# Patient Record
Sex: Female | Born: 1935 | Race: White | Hispanic: No | Marital: Single | Smoking: Never smoker
Health system: Southern US, Community
[De-identification: ages and names within clinical notes are randomized; demographics above are authoritative.]

## PROBLEM LIST (undated history)

## (undated) DIAGNOSIS — T8859XA Other complications of anesthesia, initial encounter: Secondary | ICD-10-CM

## (undated) DIAGNOSIS — I10 Essential (primary) hypertension: Secondary | ICD-10-CM

## (undated) DIAGNOSIS — H269 Unspecified cataract: Secondary | ICD-10-CM

## (undated) DIAGNOSIS — T4145XA Adverse effect of unspecified anesthetic, initial encounter: Secondary | ICD-10-CM

## (undated) DIAGNOSIS — F0390 Unspecified dementia without behavioral disturbance: Secondary | ICD-10-CM

## (undated) DIAGNOSIS — G459 Transient cerebral ischemic attack, unspecified: Secondary | ICD-10-CM

## (undated) DIAGNOSIS — R112 Nausea with vomiting, unspecified: Secondary | ICD-10-CM

## (undated) DIAGNOSIS — E039 Hypothyroidism, unspecified: Secondary | ICD-10-CM

## (undated) DIAGNOSIS — I38 Endocarditis, valve unspecified: Secondary | ICD-10-CM

## (undated) DIAGNOSIS — Z9889 Other specified postprocedural states: Secondary | ICD-10-CM

## (undated) DIAGNOSIS — M1990 Unspecified osteoarthritis, unspecified site: Secondary | ICD-10-CM

## (undated) DIAGNOSIS — K219 Gastro-esophageal reflux disease without esophagitis: Secondary | ICD-10-CM

## (undated) HISTORY — PX: COLONOSCOPY: SHX174

## (undated) HISTORY — PX: DILATION AND CURETTAGE OF UTERUS: SHX78

## (undated) HISTORY — PX: CATARACT EXTRACTION: SUR2

## (undated) HISTORY — PX: BREAST SURGERY: SHX581

---

## 2009-05-27 ENCOUNTER — Ambulatory Visit: Payer: Self-pay | Admitting: Diagnostic Radiology

## 2009-05-27 ENCOUNTER — Emergency Department (HOSPITAL_BASED_OUTPATIENT_CLINIC_OR_DEPARTMENT_OTHER): Admission: EM | Admit: 2009-05-27 | Discharge: 2009-05-28 | Payer: Self-pay | Admitting: Emergency Medicine

## 2010-07-11 LAB — URINE MICROSCOPIC-ADD ON

## 2010-07-11 LAB — URINALYSIS, ROUTINE W REFLEX MICROSCOPIC
Glucose, UA: NEGATIVE mg/dL
Hgb urine dipstick: NEGATIVE
Ketones, ur: NEGATIVE mg/dL
Protein, ur: NEGATIVE mg/dL
Specific Gravity, Urine: 1.023 (ref 1.005–1.030)

## 2010-07-11 LAB — CBC
HCT: 37.9 % (ref 36.0–46.0)
MCV: 82.3 fL (ref 78.0–100.0)
RBC: 4.61 MIL/uL (ref 3.87–5.11)
WBC: 8.1 10*3/uL (ref 4.0–10.5)

## 2010-07-11 LAB — COMPREHENSIVE METABOLIC PANEL
AST: 23 U/L (ref 0–37)
Albumin: 4.3 g/dL (ref 3.5–5.2)
BUN: 20 mg/dL (ref 6–23)
Calcium: 9.6 mg/dL (ref 8.4–10.5)
Creatinine, Ser: 0.8 mg/dL (ref 0.4–1.2)
GFR calc non Af Amer: >60 mL/min (ref >60)
Total Protein: 7.5 g/dL (ref 6.0–8.3)

## 2010-07-11 LAB — DIFFERENTIAL
Basophils Absolute: 0.2 10*3/uL — ABNORMAL HIGH (ref 0.0–0.1)
Basophils Relative: 2 % — ABNORMAL HIGH (ref 0–1)
Eosinophils Absolute: 0.1 10*3/uL (ref 0.0–0.7)
Eosinophils Relative: 1 % (ref 0–5)
Monocytes Absolute: 0.8 10*3/uL (ref 0.1–1.0)

## 2012-07-19 ENCOUNTER — Emergency Department (HOSPITAL_BASED_OUTPATIENT_CLINIC_OR_DEPARTMENT_OTHER)
Admission: EM | Admit: 2012-07-19 | Discharge: 2012-07-19 | Disposition: A | Discharge status: Home / Self Care | Payer: Medicare Other | Attending: Emergency Medicine | Admitting: Emergency Medicine

## 2012-07-19 ENCOUNTER — Encounter (HOSPITAL_BASED_OUTPATIENT_CLINIC_OR_DEPARTMENT_OTHER): Payer: Self-pay | Admitting: Emergency Medicine

## 2012-07-19 DIAGNOSIS — Z8669 Personal history of other diseases of the nervous system and sense organs: Secondary | ICD-10-CM | POA: Insufficient documentation

## 2012-07-19 DIAGNOSIS — R5381 Other malaise: Secondary | ICD-10-CM | POA: Insufficient documentation

## 2012-07-19 DIAGNOSIS — H5702 Anisocoria: Secondary | ICD-10-CM | POA: Insufficient documentation

## 2012-07-19 DIAGNOSIS — R531 Weakness: Secondary | ICD-10-CM

## 2012-07-19 HISTORY — DX: Unspecified cataract: H26.9

## 2012-07-19 HISTORY — DX: Essential (primary) hypertension: I10

## 2012-07-19 LAB — CBC WITH DIFFERENTIAL/PLATELET
Basophils Relative: 0 % (ref 0–1)
Eosinophils Absolute: 0.1 10*3/uL (ref 0.0–0.7)
HCT: 38.7 % (ref 36.0–46.0)
Lymphocytes Relative: 22 % (ref 12–46)
Lymphs Abs: 1.5 10*3/uL (ref 0.7–4.0)
MCV: 80.8 fL (ref 78.0–100.0)
RBC: 4.79 MIL/uL (ref 3.87–5.11)
RDW: 13.5 % (ref 11.5–15.5)

## 2012-07-19 LAB — URINALYSIS, ROUTINE W REFLEX MICROSCOPIC
Bilirubin Urine: NEGATIVE
Hgb urine dipstick: NEGATIVE
Nitrite: NEGATIVE

## 2012-07-19 LAB — BASIC METABOLIC PANEL
Creatinine, Ser: 0.8 mg/dL (ref 0.50–1.10)
GFR calc Af Amer: 75 mL/min — ABNORMAL LOW (ref >90)
Sodium: 133 mEq/L — ABNORMAL LOW (ref 135–145)

## 2012-07-19 MED ORDER — SODIUM CHLORIDE 0.9 % IV BOLUS (SEPSIS)
1000.0000 mL | Freq: Once | INTRAVENOUS | Status: AC
  Administered 2012-07-19: 1000 mL via INTRAVENOUS

## 2012-07-19 NOTE — ED Notes (Signed)
Pt reports feelings of weakness have been going on for about a month. Pt reported to ED because she realized she "has got to do something" about the weakness.

## 2012-07-19 NOTE — ED Notes (Signed)
PT ASSISTED WITH CALLING HER SON TO PICK HER UP. SON STATES HE IS ON HIS WAY HERE.

## 2012-07-19 NOTE — ED Notes (Signed)
Pt unable to remember any past medical history. States that the only medications she takes are "vitamins" but does not know which vitamins. Son states that she takes Lisinopril for HTN. Son said do not give pt anything to help her sleep because it effects her too strongly. Son reports that pt was on a "statin" but that she stopped due to weakness.

## 2012-07-19 NOTE — ED Provider Notes (Signed)
History     CSN: [id number]  Arrival date & time 07/19/12  4098   First MD Initiated Contact with Patient 07/19/12 [phone]      Chief Complaint  Patient presents with  . Weakness    (Consider location/radiation/quality/duration/timing/severity/associated sxs/prior treatment) Patient is a 79 y.o. female presenting with weakness.  Weakness   Pt with no significant PMH reports generalized weakness for the last 2 months, gradually worsening. Unable to continue with previous exercise regimen, but denies any other specific symptoms. She has not had any CP, SOB, N/V/D, bloody or melanic stools, no dysuria or frequency. No weight loss. She has had normal appetite.   Past Medical History  Diagnosis Date  . Cataract     Past Surgical History  Procedure Laterality Date  . Cataract extraction      No family history on file.  History  Substance Use Topics  . Smoking status: Never Smoker   . Smokeless tobacco: Not on file  . Alcohol Use: No    OB History   Grav Para Term Preterm Abortions TAB SAB Ect Mult Living                  Review of Systems  Neurological: Positive for weakness.   All other systems reviewed and are negative except as noted in HPI.   Allergies  Review of patient's allergies indicates not on file.  Home Medications  No current outpatient prescriptions on file.  BP 129/62  Pulse 65  Temp(Src) 97.5 F (36.4 C) (Oral)  Resp 14  Ht 5\' 4"  (1.626 m)  Wt 118 lb (53.524 kg)  BMI 20.24 kg/m2  SpO2 97%  Physical Exam  Nursing note and vitals reviewed. Constitutional: She is oriented to person, place, and time. She appears well-developed and well-nourished.  HENT:  Head: Normocephalic and atraumatic.  Eyes: EOM are normal.  Anisocoria from prior cataract surgeries  Neck: Normal range of motion. Neck supple.  Cardiovascular: Normal rate, normal heart sounds and intact distal pulses.   Pulmonary/Chest: Effort normal and breath sounds normal.   Abdominal: Bowel sounds are normal. She exhibits no distension. There is no tenderness.  Musculoskeletal: Normal range of motion. She exhibits no edema and no tenderness.  Neurological: She is alert and oriented to person, place, and time. She has normal strength. No cranial nerve deficit or sensory deficit.  Skin: Skin is warm and dry. No rash noted.  Psychiatric: She has a normal mood and affect.    ED Course  Procedures (including critical care time)  Labs Reviewed  BASIC METABOLIC PANEL - Abnormal; Notable for the following:    Sodium 133 (*)    GFR calc non Af Amer 64 (*)    GFR calc Af Amer 75 (*)    All other components within normal limits  URINE CULTURE  URINALYSIS, ROUTINE W REFLEX MICROSCOPIC  CBC WITH DIFFERENTIAL   No results found.   1. Generalized weakness       MDM   Date: 07/19/2012  Rate: 59  Rhythm: normal sinus rhythm  QRS Axis: normal  Intervals: normal  ST/T Wave abnormalities: normal  Conduction Disutrbances:none  Narrative Interpretation: LVH criteria  Old EKG Reviewed: none available  12:48 PM Pt with generalized weakness, no focal neuro deficits and no other specific complaints. Basic labs here are unremarkable. No anemia or electrolyte abnormalities. EKG unremarkable. Advised to followup with PCP for further evaluation and more in depth evaluation than we can do here including consideration of thyroid  function.         Charles B. Bernette Mayers, MD 07/19/12 1249

## 2012-07-20 LAB — URINE CULTURE: Colony Count: NO GROWTH

## 2012-10-15 ENCOUNTER — Encounter (HOSPITAL_BASED_OUTPATIENT_CLINIC_OR_DEPARTMENT_OTHER): Payer: Self-pay | Admitting: Emergency Medicine

## 2012-10-15 ENCOUNTER — Emergency Department (HOSPITAL_BASED_OUTPATIENT_CLINIC_OR_DEPARTMENT_OTHER): Payer: Medicare Other

## 2012-10-15 ENCOUNTER — Emergency Department (HOSPITAL_BASED_OUTPATIENT_CLINIC_OR_DEPARTMENT_OTHER)
Admission: EM | Admit: 2012-10-15 | Discharge: 2012-10-15 | Disposition: A | Discharge status: Home / Self Care | Payer: Medicare Other | Attending: Emergency Medicine | Admitting: Emergency Medicine

## 2012-10-15 DIAGNOSIS — Z8669 Personal history of other diseases of the nervous system and sense organs: Secondary | ICD-10-CM | POA: Insufficient documentation

## 2012-10-15 DIAGNOSIS — Z9849 Cataract extraction status, unspecified eye: Secondary | ICD-10-CM | POA: Insufficient documentation

## 2012-10-15 DIAGNOSIS — Z79899 Other long term (current) drug therapy: Secondary | ICD-10-CM | POA: Insufficient documentation

## 2012-10-15 DIAGNOSIS — M533 Sacrococcygeal disorders, not elsewhere classified: Secondary | ICD-10-CM | POA: Insufficient documentation

## 2012-10-15 DIAGNOSIS — I10 Essential (primary) hypertension: Secondary | ICD-10-CM | POA: Insufficient documentation

## 2012-10-15 MED ORDER — HYDROCODONE-ACETAMINOPHEN 5-325 MG PO TABS: 0.5000 | ORAL_TABLET | ORAL | Status: DC | PRN

## 2012-10-15 MED ORDER — ONDANSETRON HCL 4 MG/2ML IJ SOLN
4.0000 mg | Freq: Once | INTRAMUSCULAR | Status: AC
  Administered 2012-10-15: 4 mg via INTRAVENOUS

## 2012-10-15 MED ORDER — NAPROXEN 250 MG PO TABS
250.0000 mg | ORAL_TABLET | Freq: Once | ORAL | Status: AC
  Administered 2012-10-15: 250 mg via ORAL
  Filled 2012-10-15: qty 1

## 2012-10-15 MED ORDER — FENTANYL CITRATE 0.05 MG/ML IJ SOLN
50.0000 ug | Freq: Once | INTRAMUSCULAR | Status: AC
  Administered 2012-10-15: 04:00:00 via INTRAVENOUS

## 2012-10-15 MED ORDER — ONDANSETRON HCL 4 MG/2ML IJ SOLN
INTRAMUSCULAR | Status: AC
  Administered 2012-10-15: 4 mg via INTRAVENOUS
  Filled 2012-10-15: qty 2

## 2012-10-15 MED ORDER — CELECOXIB 200 MG PO CAPS: ORAL_CAPSULE | ORAL | Status: DC

## 2012-10-15 MED ORDER — FENTANYL CITRATE 0.05 MG/ML IJ SOLN
25.0000 ug | Freq: Once | INTRAMUSCULAR | Status: AC
  Administered 2012-10-15: 25 ug via INTRAVENOUS
  Filled 2012-10-15: qty 2

## 2012-10-15 MED ORDER — SODIUM CHLORIDE 0.9 % IV SOLN
Freq: Once | INTRAVENOUS | Status: AC
  Administered 2012-10-15: 04:00:00 via INTRAVENOUS

## 2012-10-15 NOTE — ED Notes (Signed)
Patient transported to X-ray 

## 2012-10-15 NOTE — ED Provider Notes (Signed)
History    CSN: [id number] Arrival date & time 10/15/12  4098  First MD Initiated Contact with Patient 10/15/12 [phone]     Chief Complaint  Patient presents with  . Back Pain   (Consider location/radiation/quality/duration/timing/severity/associated sxs/prior Treatment) HPI An 79 year old female with a two-week history of pain in her left sacroiliac joint. It significantly worsened yesterday evening and she describes it as a 9/10. It is a sharp pain that does not radiate. It is worse with movement and makes ambulation and movement difficult. She denies injury although she has fallen as a result of the pain.   Past Medical History  Diagnosis Date  . Cataract   . Hypertension    Past Surgical History  Procedure Laterality Date  . Cataract extraction     History reviewed. No pertinent family history. History  Substance Use Topics  . Smoking status: Never Smoker   . Smokeless tobacco: Not on file  . Alcohol Use: No   OB History   Grav Para Term Preterm Abortions TAB SAB Ect Mult Living                 Review of Systems  All other systems reviewed and are negative.    Allergies  Review of patient's allergies indicates no known allergies.  Home Medications   Current Outpatient Rx  Name  Route  Sig  Dispense  Refill  . diazepam (VALIUM) 2 MG tablet   Oral   Take 2 mg by mouth every 6 (six) hours as needed for anxiety.         Marland Kitchen LISINOPRIL PO   Oral   Take by mouth.          BP 169/84  Pulse 63  Temp(Src) 97.8 F (36.6 C) (Oral)  Resp 18  SpO2 100%  Physical Exam General: Somewhat frail-appearing female in no acute distress; appearance consistent with age of record HENT: normocephalic, atraumatic Eyes: Pupils irregular due to surgical changes; extraocular muscles intact Neck: supple Heart: regular rate and rhythm Lungs: clear to auscultation bilaterally Abdomen: soft; nondistended; nontender; bowel sounds present Back: Left sacroiliac  tenderness Extremities: No deformity; full range of motion; pulses normal; no edema Neurologic: Awake, alert and oriented; motor function intact in all extremities and symmetric; no facial droop Skin: Warm and dry Psychiatric: Flat affect    ED Course  Procedures (including critical care time)    MDM  Nursing notes and vitals signs, including pulse oximetry, reviewed.  Summary of this visit's results, reviewed by myself:  Labs:  No results found for this or any previous visit (from the past 24 hour(s)).  Imaging Studies: Dg Pelvis 1-2 Views  10/15/2012   *RADIOLOGY REPORT*  Clinical Data: Low back pain.  No known injury.  PELVIS - 1-2 VIEW  Comparison: None.  Findings: Early degenerative changes in the hips bilaterally.  SI joints are symmetric and unremarkable.  Degenerative changes in the visualized lower lumbar spine. No acute bony abnormality. Specifically, no fracture, subluxation, or dislocation.  Soft tissues are intact.  IMPRESSION: No acute bony abnormality.   Original Report Authenticated By: Charlett Nose, M.D.   Dg Si Joints  10/15/2012   *RADIOLOGY REPORT*  Clinical Data: Left SI joint pain.  Low back pain.  BILATERAL SACROILIAC JOINTS - 3+ VIEW  Comparison: Pelvis performed today.  Findings: SI joints are symmetric and unremarkable.  Early degenerative changes in the hips bilaterally.  No fracture, subluxation or dislocation.  IMPRESSION: No acute bony abnormality.  Original Report Authenticated By: Charlett Nose, M.D.   4:49 AM Pain improved with IV meds. She is not currently on any pain medication.  Hanley Seamen, MD 10/15/12 [phone]

## 2012-10-15 NOTE — ED Notes (Signed)
Pt reports pain x 2 weeks to abdominal/flank, got significantly worse last pm, unable to move

## 2012-11-09 ENCOUNTER — Encounter (HOSPITAL_BASED_OUTPATIENT_CLINIC_OR_DEPARTMENT_OTHER): Payer: Self-pay

## 2012-11-09 ENCOUNTER — Emergency Department (HOSPITAL_BASED_OUTPATIENT_CLINIC_OR_DEPARTMENT_OTHER)
Admission: EM | Admit: 2012-11-09 | Discharge: 2012-11-09 | Disposition: A | Discharge status: Other Acute Inpatient Hospital | Payer: Medicare Other | Attending: Emergency Medicine | Admitting: Emergency Medicine

## 2012-11-09 ENCOUNTER — Emergency Department (HOSPITAL_BASED_OUTPATIENT_CLINIC_OR_DEPARTMENT_OTHER): Payer: Medicare Other

## 2012-11-09 DIAGNOSIS — R51 Headache: Secondary | ICD-10-CM | POA: Insufficient documentation

## 2012-11-09 DIAGNOSIS — I639 Cerebral infarction, unspecified: Secondary | ICD-10-CM

## 2012-11-09 DIAGNOSIS — I10 Essential (primary) hypertension: Secondary | ICD-10-CM | POA: Insufficient documentation

## 2012-11-09 DIAGNOSIS — Z79899 Other long term (current) drug therapy: Secondary | ICD-10-CM | POA: Insufficient documentation

## 2012-11-09 DIAGNOSIS — R5381 Other malaise: Secondary | ICD-10-CM | POA: Insufficient documentation

## 2012-11-09 DIAGNOSIS — I6350 Cerebral infarction due to unspecified occlusion or stenosis of unspecified cerebral artery: Secondary | ICD-10-CM | POA: Insufficient documentation

## 2012-11-09 DIAGNOSIS — Z8669 Personal history of other diseases of the nervous system and sense organs: Secondary | ICD-10-CM | POA: Insufficient documentation

## 2012-11-09 DIAGNOSIS — F29 Unspecified psychosis not due to a substance or known physiological condition: Secondary | ICD-10-CM | POA: Insufficient documentation

## 2012-11-09 LAB — URINALYSIS, ROUTINE W REFLEX MICROSCOPIC
Glucose, UA: NEGATIVE mg/dL
Hgb urine dipstick: NEGATIVE
Ketones, ur: NEGATIVE mg/dL
Protein, ur: NEGATIVE mg/dL
Urobilinogen, UA: 0.2 mg/dL (ref 0.0–1.0)

## 2012-11-09 LAB — CBC WITH DIFFERENTIAL/PLATELET
Basophils Absolute: 0 10*3/uL (ref 0.0–0.1)
Basophils Relative: 0 % (ref 0–1)
MCH: 26.7 pg (ref 26.0–34.0)
MCHC: 32.7 g/dL (ref 30.0–36.0)
Neutrophils Relative %: 64 % (ref 43–77)
Platelets: 216 10*3/uL (ref 150–400)
RDW: 13.6 % (ref 11.5–15.5)
WBC: 5.4 10*3/uL (ref 4.0–10.5)

## 2012-11-09 LAB — COMPREHENSIVE METABOLIC PANEL
AST: 17 U/L (ref 0–37)
CO2: 27 mEq/L (ref 19–32)
Calcium: 10.6 mg/dL — ABNORMAL HIGH (ref 8.4–10.5)
Chloride: 101 mEq/L (ref 96–112)
Creatinine, Ser: 1 mg/dL (ref 0.50–1.10)
GFR calc Af Amer: 57 mL/min — ABNORMAL LOW (ref >90)
GFR calc non Af Amer: 49 mL/min — ABNORMAL LOW (ref >90)
Glucose, Bld: 99 mg/dL (ref 70–99)
Total Bilirubin: 0.3 mg/dL (ref 0.3–1.2)

## 2012-11-09 LAB — URINE MICROSCOPIC-ADD ON

## 2012-11-09 MED ORDER — SODIUM CHLORIDE 0.9 % IV BOLUS (SEPSIS)
500.0000 mL | Freq: Once | INTRAVENOUS | Status: AC
  Administered 2012-11-09: 500 mL via INTRAVENOUS

## 2012-11-09 MED ORDER — ASPIRIN 81 MG PO CHEW
324.0000 mg | CHEWABLE_TABLET | Freq: Once | ORAL | Status: AC
  Administered 2012-11-09: 324 mg via ORAL
  Filled 2012-11-09: qty 4

## 2012-11-09 NOTE — ED Notes (Signed)
Pt returned from xray

## 2012-11-09 NOTE — ED Notes (Signed)
Pt son stated that she called him this morning, was confused and disoriented.  Pt is not a good historian. Pt in NAD, alert and oriented x3, lungs sound clear.  Pt stated she has had some urinary frequency but denies burning or other urinary complaints.

## 2012-11-09 NOTE — ED Provider Notes (Addendum)
History    CSN: [id number] Arrival date & time 11/09/12  8469  First MD Initiated Contact with Patient 11/09/12 [phone]     Chief Complaint  Patient presents with  . Altered Mental Status   (Consider location/radiation/quality/duration/timing/severity/associated sxs/prior Treatment) HPI Comments: Patient with history of uti, early dementia.  Brought by son for evaluation of confusion, difficulty speaking starting this morning.  She called him and said that she though she was having a stroke and tells me that her head hurts.  She fell several weeks ago, but denies falling today.  Recently treated for a uti.    Patient is a 79 y.o. female presenting with altered mental status. The history is provided by the patient.  Altered Mental Status Presenting symptoms: behavior changes, confusion and disorientation   Severity:  Moderate Most recent episode:  Today Episode history:  Continuous Timing:  Constant Progression:  Worsening Chronicity:  New Associated symptoms: headaches   Associated symptoms: no abdominal pain and no fever    Past Medical History  Diagnosis Date  . Cataract   . Hypertension    Past Surgical History  Procedure Laterality Date  . Cataract extraction     History reviewed. No pertinent family history. History  Substance Use Topics  . Smoking status: Never Smoker   . Smokeless tobacco: Not on file  . Alcohol Use: No   OB History   Grav Para Term Preterm Abortions TAB SAB Ect Mult Living                 Review of Systems  Constitutional: Positive for fatigue. Negative for fever.  Gastrointestinal: Negative for abdominal pain.  Neurological: Positive for headaches. Negative for facial asymmetry and numbness.  Psychiatric/Behavioral: Positive for confusion and altered mental status.  All other systems reviewed and are negative.    Allergies  Review of patient's allergies indicates no known allergies.  Home Medications   Current Outpatient Rx  Name   Route  Sig  Dispense  Refill  . celecoxib (CELEBREX) 200 MG capsule      Take 1 capsule daily for back pain.   30 capsule   0   . diazepam (VALIUM) 2 MG tablet   Oral   Take 2 mg by mouth every 6 (six) hours as needed for anxiety.         Marland Kitchen HYDROcodone-acetaminophen (NORCO/VICODIN) 5-325 MG per tablet   Oral   Take 0.5-1 tablets by mouth every 4 (four) hours as needed for pain.   20 tablet   0   . LISINOPRIL PO   Oral   Take by mouth.          BP 126/83  Pulse 94  Temp(Src) 98.1 F (36.7 C) (Oral)  Resp 20  Wt 117 lb (53.071 kg)  BMI 20.07 kg/m2  SpO2 98% Physical Exam  Nursing note and vitals reviewed. Constitutional: No distress.  Elderly female in no distress.  HENT:  Head: Normocephalic and atraumatic.  Mouth/Throat: Oropharynx is clear and moist.  Eyes:  The left pupil is mid-fixed and non-reactive.  She tells me this is normal due to a cataract.  The right pupil is round and reactive.    Neck: Normal range of motion. Neck supple.  Cardiovascular: Normal rate, regular rhythm and normal heart sounds.   No murmur heard. Pulmonary/Chest: Effort normal and breath sounds normal. No respiratory distress. She has no wheezes.  Abdominal: Soft. Bowel sounds are normal. She exhibits no distension. There is no tenderness.  Musculoskeletal: Normal range of motion. She exhibits no edema.  Lymphadenopathy:    She has no cervical adenopathy.  Neurological: She is alert. No cranial nerve deficit. She exhibits normal muscle tone. Coordination normal.  She is alert and oriented to person, place, situation, but not time.  She believes it is 27.  Skin: Skin is warm and dry. She is not diaphoretic.    ED Course  Procedures (including critical care time) Labs Reviewed - No data to display No results found. No diagnosis found.   Date: 11/09/2012  Rate: 64  Rhythm: normal sinus rhythm  QRS Axis: left  Intervals: normal  ST/T Wave abnormalities: normal  Conduction  Disutrbances:none  Narrative Interpretation:   Old EKG Reviewed: unchanged    MDM  Workup shows what appears to be an area of cerebral ischemia in the right cerebral hemisphere.  I have spoken with Dr. Leroy Kennedy who believes the patient requires admission and further workup.  I have spoken with the patient's son who informs me the pcp is a Dr. Katrinka Blazing with [facility].  I have spoken with Dr. Ludwig Clarks from [facility] who agrees to accept the patient in transfer.    Geoffery Lyons, MD 11/09/12 1115  Geoffery Lyons, MD 11/09/12 1150

## 2013-01-09 ENCOUNTER — Encounter (HOSPITAL_BASED_OUTPATIENT_CLINIC_OR_DEPARTMENT_OTHER): Payer: Self-pay

## 2013-01-09 ENCOUNTER — Emergency Department (HOSPITAL_BASED_OUTPATIENT_CLINIC_OR_DEPARTMENT_OTHER)
Admission: EM | Admit: 2013-01-09 | Discharge: 2013-01-09 | Disposition: A | Discharge status: Home / Self Care | Payer: Medicare Other | Attending: Emergency Medicine | Admitting: Emergency Medicine

## 2013-01-09 ENCOUNTER — Emergency Department (HOSPITAL_BASED_OUTPATIENT_CLINIC_OR_DEPARTMENT_OTHER): Payer: Medicare Other

## 2013-01-09 DIAGNOSIS — I10 Essential (primary) hypertension: Secondary | ICD-10-CM | POA: Insufficient documentation

## 2013-01-09 DIAGNOSIS — M5430 Sciatica, unspecified side: Secondary | ICD-10-CM | POA: Insufficient documentation

## 2013-01-09 DIAGNOSIS — M549 Dorsalgia, unspecified: Secondary | ICD-10-CM | POA: Insufficient documentation

## 2013-01-09 DIAGNOSIS — M5432 Sciatica, left side: Secondary | ICD-10-CM

## 2013-01-09 DIAGNOSIS — Z79899 Other long term (current) drug therapy: Secondary | ICD-10-CM | POA: Insufficient documentation

## 2013-01-09 DIAGNOSIS — F29 Unspecified psychosis not due to a substance or known physiological condition: Secondary | ICD-10-CM | POA: Insufficient documentation

## 2013-01-09 DIAGNOSIS — Z8669 Personal history of other diseases of the nervous system and sense organs: Secondary | ICD-10-CM | POA: Insufficient documentation

## 2013-01-09 MED ORDER — HYDROCODONE-ACETAMINOPHEN 5-325 MG PO TABS: 1.0000 | ORAL_TABLET | Freq: Four times a day (QID) | ORAL | Status: DC | PRN

## 2013-01-09 MED ORDER — HYDROCODONE-ACETAMINOPHEN 5-325 MG PO TABS
1.0000 | ORAL_TABLET | Freq: Once | ORAL | Status: AC
  Administered 2013-01-09: 1 via ORAL
  Filled 2013-01-09: qty 1

## 2013-01-09 NOTE — ED Provider Notes (Signed)
CSN: [id number]     Arrival date & time 01/09/13  0865 History   First MD Initiated Contact with Patient 01/09/13 1101     Chief Complaint  Patient presents with  . Hip Pain   (Consider location/radiation/quality/duration/timing/severity/associated sxs/prior Treatment) The history is provided by the patient and a relative.   79 year old female. Presents with a complaint of left-sided back pain and hip pain. No recent injury. In discussion with family she's had pain in this area for several months. Patient does have a primary care provider. Patient seen June 27th 2014 for similar complaint had negative x-rays at that time. Patient was referred her primary care provider and started on hydrocodone at that time. The exact nature of the pain is difficult to assess seems to be left lumbar area and left hip area no distinct evidence of sciatica no distinct evidence of a neurovascular problem with the left leg. Made worse by movement. Patient states it's a 10 out of 10. No history of injury. Pain radiates from the hip up into the low part of the back. Back in June when patient was seen diagnosis was sciatic joint dysfunction. Patient seems to have a component of dementia and that she forgets questions that she's asked before but she is cooperative and does follow commands.  Past Medical History  Diagnosis Date  . Cataract   . Hypertension    Past Surgical History  Procedure Laterality Date  . Cataract extraction     No family history on file. History  Substance Use Topics  . Smoking status: Never Smoker   . Smokeless tobacco: Not on file  . Alcohol Use: No   OB History   Grav Para Term Preterm Abortions TAB SAB Ect Mult Living                 Review of Systems  Constitutional: Negative for fever.  HENT: Negative for neck pain.   Eyes: Negative for visual disturbance.  Respiratory: Negative for shortness of breath.   Cardiovascular: Negative for chest pain.  Gastrointestinal: Negative  for nausea, vomiting and abdominal pain.  Genitourinary: Negative for dysuria.  Musculoskeletal: Positive for back pain and arthralgias.  Skin: Negative for rash.  Neurological: Negative for headaches.  Hematological: Does not bruise/bleed easily.  Psychiatric/Behavioral: Positive for confusion.    Allergies  Review of patient's allergies indicates no known allergies.  Home Medications   Current Outpatient Rx  Name  Route  Sig  Dispense  Refill  . atorvastatin (LIPITOR) 20 MG tablet   Oral   Take 20 mg by mouth daily.         . clopidogrel (PLAVIX) 75 MG tablet   Oral   Take 75 mg by mouth daily.         Marland Kitchen estrogens, conjugated, (PREMARIN) 0.625 MG tablet   Oral   Take 0.625 mg by mouth daily. Take daily for 21 days then do not take for 7 days.         Marland Kitchen QUEtiapine (SEROQUEL) 50 MG tablet   Oral   Take 50 mg by mouth at bedtime.         . rivastigmine (EXELON) 1.5 MG capsule   Oral   Take 1.5 mg by mouth 2 (two) times daily.         . traMADol (ULTRAM) 50 MG tablet   Oral   Take 50 mg by mouth every 6 (six) hours as needed for pain.         Marland Kitchen  vitamin B-12 (CYANOCOBALAMIN) 1000 MCG tablet   Oral   Take 1,000 mcg by mouth daily.         . celecoxib (CELEBREX) 200 MG capsule      Take 1 capsule daily for back pain.   30 capsule   0   . diazepam (VALIUM) 2 MG tablet   Oral   Take 2 mg by mouth every 6 (six) hours as needed for anxiety.         Marland Kitchen HYDROcodone-acetaminophen (NORCO/VICODIN) 5-325 MG per tablet   Oral   Take 0.5-1 tablets by mouth every 4 (four) hours as needed for pain.   20 tablet   0   . HYDROcodone-acetaminophen (NORCO/VICODIN) 5-325 MG per tablet   Oral   Take 1 tablet by mouth every 6 (six) hours as needed for pain.   20 tablet   0   . LISINOPRIL PO   Oral   Take by mouth.          BP 125/98  Pulse 73  Temp(Src) 98.1 F (36.7 C)  Resp 22  SpO2 100% Physical Exam  Nursing note and vitals  reviewed. Constitutional: She appears well-developed and well-nourished.  HENT:  Head: Normocephalic and atraumatic.  Mouth/Throat: Oropharynx is clear and moist.  Eyes: Conjunctivae and EOM are normal. Pupils are equal, round, and reactive to light.  Neck: Normal range of motion.  Cardiovascular: Normal rate and regular rhythm.   No murmur heard. Pulmonary/Chest: Effort normal and breath sounds normal. No respiratory distress.  Abdominal: Soft. Bowel sounds are normal. There is no tenderness.  Musculoskeletal: She exhibits tenderness. She exhibits no edema.  Tenderness to palpation to the left lumbar area tenderness to palpation of the left hip and increased pain with range of motion of the left hip. Distally Refill to the left foot is 2 seconds sensation intact good range of motion.  Neurological: She is alert. No cranial nerve deficit. She exhibits normal muscle tone. Coordination normal.  Skin: Skin is warm. No rash noted. No erythema.    ED Course  Procedures (including critical care time) Labs Review Labs Reviewed - No data to display Imaging Review Ct Lumbar Spine Wo Contrast  01/09/2013   *RADIOLOGY REPORT*  Clinical Data: Low back and left hip pain secondary to a fall yesterday.  Multiple falls recently.  CT LUMBAR SPINE WITHOUT CONTRAST  Technique:  Multidetector CT imaging of the lumbar spine was performed without intravenous contrast administration. Multiplanar CT image reconstructions were also generated.  Comparison: CT scan of the abdomen and pelvis dated 05/27/2009  Findings: There is no fracture.  T12-L1:  Disc is normal.  Moderate facet arthritis.  L1-2:  Disc is normal.  Moderate left facet arthritis.  L2-3:  Asymmetric disc bulge into the left lateral recess, unchanged.  Disc space narrowing.  Slight facet arthritis.  L3-4:  Marked disc space narrowing with a soft disc protrusion into the left lateral recess which could affect the left L4 nerve.  This does not appear acute.   L4-5:  Broad-based disc bulge with a soft disc protrusion into the right lateral recess, new since the prior study.  L5-S1:  Large new synovial cyst arising from the left facet joint measuring 16 x 15 x 12 mm markedly compressing the left side of the thecal sac.  This should affect the left S1 and possibly S2 nerves. Severe bilateral facet arthritis.  There is slight irregularity of the left sacral ala on the very last image of this  exam, image number 99 of series 4.  This could represent a subtle fracture of the left sacral ala.  IMPRESSION: 1.  Possible fracture of the left sacral ala.  CT scan of the sacrum would be useful for further evaluation. 2.  Large synovial cyst at L5-S1 compressing the left side of the thecal sac and left S1 and possibly S2 nerves. 3.  Disc protrusions to the left at L3-4 and to the right at L4-5.   Original Report Authenticated By: Francene Boyers, M.D.   Ct Pelvis Wo Contrast  01/09/2013   CLINICAL DATA:  Possible sacral fracture noted on recent CT.  EXAM: CT PELVIS WITHOUT CONTRAST  TECHNIQUE: Multidetector CT imaging of the pelvis was performed following the standard protocol without intravenous contrast.  COMPARISON:  None.  IMPRESSION:THERE IS NO EVIDENCE OF A SACRAL FRACTURE.  DEGENERATIVE CHANGE OF THE SI JOINTS IS PRESENT.  OSTEOPENIA. ILIAC BONES ARE INTACT. MILD DEGENERATIVE CHANGE OF THE HIP JOINTS BILATERALLY. DEGENERATIVE CHANGES IN THE LUMBAR SPINE ARE PRESENT.   THE GIANT LEFT-SIDED SYNOVIAL CYSTS AT L5-S1 IS AGAIN NOTED.: IMPRESSION:THERE IS NO EVIDENCE OF A SACRAL FRACTURE. DEGENERATIVE CHANGE OF THE SI JOINTS IS PRESENT. OSTEOPENIA. ILIAC BONES ARE INTACT. MILD DEGENERATIVE CHANGE OF THE HIP JOINTS BILATERALLY. DEGENERATIVE CHANGES IN THE LUMBAR SPINE ARE PRESENT. THE GIANT LEFT-SIDED SYNOVIAL CYSTS AT L5-S1 IS AGAIN NOTED.  Bladder is decompressed. Uterus and adnexa are within normal limits. Sigmoid diverticulosis is present without evidence of acute diverticulitis.  Normal appendix. No free fluid.  No evidence of sacral fracture.   Electronically Signed   By: Maryclare Bean M.D.   On: 01/09/2013 14:03   Ct Hip Left Wo Contrast  01/09/2013   *RADIOLOGY REPORT*  Clinical Data: Left hip and low back pain secondary to a fall yesterday.  Multiple falls recently.  CT OF THE LEFT HIP WITHOUT CONTRAST  Technique:  Multidetector CT imaging was performed according to the standard protocol. Multiplanar CT image reconstructions were also generated.  Comparison: Pelvic radiographs dated 10/15/2012  Findings: There is no fracture or dislocation.  There are slight arthritic changes of the left hip joint.  There is chondrocalcinosis.  There are small calcifications at the hamstring origins and at the insertion of the gluteal tendons on the left greater trochanter.  No hip effusion.  Incidental note is made of diverticulosis of the distal colon.  IMPRESSION: No acute abnormality of the left hip.   Original Report Authenticated By: Francene Boyers, M.D.    MDM   1. Sciatica neuralgia, left      Extensive CT scan workup without significant findings other than in the lumbar area. A possible fracture of the left sacral hallow was ruled out by CT scan of the pelvis there is evidence of a large synovial cyst at L5-S1 compressing the left side of the thecal sac is for patients pain mostly is. Suspect that most of patient's pain is due to the lumbar part of the back. This was relayed to the patient's son followup with primary care Dr. and perhaps followup with a specialist will be important. They will make arrangements for her to see her primary care doctor in the next few days. Patient's pain treated here. No evidence of hip fracture no evidence of sacral fracture.  Shelda Jakes, MD 01/13/13 1240

## 2013-01-09 NOTE — ED Notes (Signed)
Patient here with left hip pain that has increased pain since yesterday. Tara Hicks injury, reports here for the same 2 months ago and had sciatic joint dysfunction.

## 2013-01-26 ENCOUNTER — Encounter (HOSPITAL_COMMUNITY): Payer: Self-pay | Admitting: Pharmacist

## 2013-01-27 ENCOUNTER — Other Ambulatory Visit: Payer: Self-pay | Admitting: Neurosurgery

## 2013-01-28 ENCOUNTER — Encounter (HOSPITAL_COMMUNITY)
Admission: RE | Admit: 2013-01-28 | Discharge: 2013-01-28 | Disposition: A | Discharge status: Home / Self Care | Payer: Medicare Other | Source: Ambulatory Visit | Attending: Neurosurgery | Admitting: Neurosurgery

## 2013-01-28 ENCOUNTER — Encounter (HOSPITAL_COMMUNITY): Payer: Self-pay

## 2013-01-28 DIAGNOSIS — Z01812 Encounter for preprocedural laboratory examination: Secondary | ICD-10-CM | POA: Insufficient documentation

## 2013-01-28 HISTORY — DX: Unspecified dementia, unspecified severity, without behavioral disturbance, psychotic disturbance, mood disturbance, and anxiety: F03.90

## 2013-01-28 HISTORY — DX: Gastro-esophageal reflux disease without esophagitis: K21.9

## 2013-01-28 HISTORY — DX: Other specified postprocedural states: Z98.890

## 2013-01-28 HISTORY — DX: Adverse effect of unspecified anesthetic, initial encounter: T41.45XA

## 2013-01-28 HISTORY — DX: Hypothyroidism, unspecified: E03.9

## 2013-01-28 HISTORY — DX: Endocarditis, valve unspecified: I38

## 2013-01-28 HISTORY — DX: Other complications of anesthesia, initial encounter: T88.59XA

## 2013-01-28 HISTORY — DX: Transient cerebral ischemic attack, unspecified: G45.9

## 2013-01-28 HISTORY — DX: Unspecified osteoarthritis, unspecified site: M19.90

## 2013-01-28 HISTORY — DX: Other specified postprocedural states: R11.2

## 2013-01-28 LAB — BASIC METABOLIC PANEL
BUN: 16 mg/dL (ref 6–23)
CO2: 24 mEq/L (ref 19–32)
Calcium: 8.9 mg/dL (ref 8.4–10.5)
Glucose, Bld: 92 mg/dL (ref 70–99)
Potassium: 4.6 mEq/L (ref 3.5–5.1)
Sodium: 136 mEq/L (ref 135–145)

## 2013-01-28 LAB — CBC
Hemoglobin: 12.4 g/dL (ref 12.0–15.0)
MCH: 27.8 pg (ref 26.0–34.0)
MCHC: 34.3 g/dL (ref 30.0–36.0)
MCV: 81.2 fL (ref 78.0–100.0)
Platelets: 164 10*3/uL (ref 150–400)
RBC: 4.46 MIL/uL (ref 3.87–5.11)
RDW: 13.1 % (ref 11.5–15.5)
WBC: 6.5 10*3/uL (ref 4.0–10.5)

## 2013-01-28 NOTE — Progress Notes (Addendum)
Pt denies SOB, chest pain, and being under the care of a cardiologist. Pt denies having an echo and cardiac cath but states that she had a stress test 5 years ago at [facility] in [place], [place]. According to pt son, Jorja Loa, pt stopped taking Plavix on 01/24/13.

## 2013-01-30 MED ORDER — CEFAZOLIN SODIUM-DEXTROSE 2-3 GM-% IV SOLR
2.0000 g | INTRAVENOUS | Status: AC
  Administered 2013-01-31: 2 g via INTRAVENOUS
  Filled 2013-01-30: qty 50

## 2013-01-30 MED ORDER — DEXAMETHASONE SODIUM PHOSPHATE 10 MG/ML IJ SOLN
10.0000 mg | INTRAMUSCULAR | Status: AC
  Administered 2013-01-31: 10 mg via INTRAVENOUS
  Filled 2013-01-30: qty 1

## 2013-01-31 ENCOUNTER — Encounter (HOSPITAL_COMMUNITY)
Admission: RE | Disposition: A | Discharge status: Home / Self Care | Payer: Self-pay | Source: Ambulatory Visit | Attending: Neurosurgery

## 2013-01-31 ENCOUNTER — Inpatient Hospital Stay (HOSPITAL_COMMUNITY): Payer: Medicare Other

## 2013-01-31 ENCOUNTER — Inpatient Hospital Stay (HOSPITAL_COMMUNITY): Payer: Medicare Other | Admitting: Anesthesiology

## 2013-01-31 ENCOUNTER — Encounter (HOSPITAL_COMMUNITY): Payer: Medicare Other | Admitting: Anesthesiology

## 2013-01-31 ENCOUNTER — Encounter (HOSPITAL_COMMUNITY): Payer: Self-pay | Admitting: Anesthesiology

## 2013-01-31 ENCOUNTER — Inpatient Hospital Stay (HOSPITAL_COMMUNITY)
Admission: RE | Admit: 2013-01-31 | Discharge: 2013-02-02 | DRG: 520 | Disposition: A | Discharge status: Home / Self Care | Payer: Medicare Other | Source: Ambulatory Visit | Attending: Neurosurgery | Admitting: Neurosurgery

## 2013-01-31 DIAGNOSIS — Z833 Family history of diabetes mellitus: Secondary | ICD-10-CM

## 2013-01-31 DIAGNOSIS — Z7902 Long term (current) use of antithrombotics/antiplatelets: Secondary | ICD-10-CM

## 2013-01-31 DIAGNOSIS — Z01812 Encounter for preprocedural laboratory examination: Secondary | ICD-10-CM

## 2013-01-31 DIAGNOSIS — Z8249 Family history of ischemic heart disease and other diseases of the circulatory system: Secondary | ICD-10-CM

## 2013-01-31 DIAGNOSIS — Z79899 Other long term (current) drug therapy: Secondary | ICD-10-CM

## 2013-01-31 DIAGNOSIS — Z8673 Personal history of transient ischemic attack (TIA), and cerebral infarction without residual deficits: Secondary | ICD-10-CM

## 2013-01-31 DIAGNOSIS — I10 Essential (primary) hypertension: Secondary | ICD-10-CM | POA: Diagnosis present

## 2013-01-31 DIAGNOSIS — F0390 Unspecified dementia without behavioral disturbance: Secondary | ICD-10-CM | POA: Diagnosis present

## 2013-01-31 DIAGNOSIS — K219 Gastro-esophageal reflux disease without esophagitis: Secondary | ICD-10-CM | POA: Diagnosis present

## 2013-01-31 DIAGNOSIS — M7138 Other bursal cyst, other site: Secondary | ICD-10-CM

## 2013-01-31 DIAGNOSIS — Z9849 Cataract extraction status, unspecified eye: Secondary | ICD-10-CM

## 2013-01-31 DIAGNOSIS — E039 Hypothyroidism, unspecified: Secondary | ICD-10-CM | POA: Diagnosis present

## 2013-01-31 DIAGNOSIS — Z800 Family history of malignant neoplasm of digestive organs: Secondary | ICD-10-CM

## 2013-01-31 DIAGNOSIS — M7130 Other bursal cyst, unspecified site: Principal | ICD-10-CM | POA: Diagnosis present

## 2013-01-31 HISTORY — PX: LUMBAR LAMINECTOMY/DECOMPRESSION MICRODISCECTOMY: SHX5026

## 2013-01-31 SURGERY — LUMBAR LAMINECTOMY/DECOMPRESSION MICRODISCECTOMY 1 LEVEL
Anesthesia: General | Site: Back | Laterality: Left | Wound class: Clean

## 2013-01-31 MED ORDER — ATORVASTATIN CALCIUM 20 MG PO TABS
20.0000 mg | ORAL_TABLET | Freq: Every evening | ORAL | Status: DC
  Administered 2013-01-31 – 2013-02-01 (×2): 20 mg via ORAL
  Filled 2013-01-31 (×3): qty 1

## 2013-01-31 MED ORDER — SODIUM CHLORIDE 0.9 % IJ SOLN
3.0000 mL | Freq: Two times a day (BID) | INTRAMUSCULAR | Status: DC
  Administered 2013-01-31 – 2013-02-01 (×3): 3 mL via INTRAVENOUS

## 2013-01-31 MED ORDER — LIDOCAINE HCL (CARDIAC) 20 MG/ML IV SOLN
INTRAVENOUS | Status: DC | PRN
  Administered 2013-01-31: 80 mg via INTRAVENOUS

## 2013-01-31 MED ORDER — ACETAMINOPHEN 650 MG RE SUPP: 650.0000 mg | RECTAL | Status: DC | PRN

## 2013-01-31 MED ORDER — DEXAMETHASONE 4 MG PO TABS
4.0000 mg | ORAL_TABLET | Freq: Four times a day (QID) | ORAL | Status: AC
  Administered 2013-01-31: 4 mg via ORAL
  Filled 2013-01-31: qty 1

## 2013-01-31 MED ORDER — BUPIVACAINE HCL (PF) 0.5 % IJ SOLN
INTRAMUSCULAR | Status: DC | PRN
  Administered 2013-01-31: 20 mL

## 2013-01-31 MED ORDER — PROPOFOL 10 MG/ML IV BOLUS
INTRAVENOUS | Status: DC | PRN
  Administered 2013-01-31: 140 mg via INTRAVENOUS

## 2013-01-31 MED ORDER — FENTANYL CITRATE 0.05 MG/ML IJ SOLN
INTRAMUSCULAR | Status: DC | PRN
  Administered 2013-01-31 (×3): 50 ug via INTRAVENOUS
  Administered 2013-01-31: 100 ug via INTRAVENOUS

## 2013-01-31 MED ORDER — NEOSTIGMINE METHYLSULFATE 1 MG/ML IJ SOLN
INTRAMUSCULAR | Status: DC | PRN
  Administered 2013-01-31: 5 mg via INTRAVENOUS

## 2013-01-31 MED ORDER — EPHEDRINE SULFATE 50 MG/ML IJ SOLN
INTRAMUSCULAR | Status: DC | PRN
  Administered 2013-01-31: 10 mg via INTRAVENOUS
  Administered 2013-01-31: 2.5 mg via INTRAVENOUS
  Administered 2013-01-31: 10 mg via INTRAVENOUS
  Administered 2013-01-31: 2.5 mg via INTRAVENOUS
  Administered 2013-01-31: 15 mg via INTRAVENOUS
  Administered 2013-01-31: 10 mg via INTRAVENOUS

## 2013-01-31 MED ORDER — 0.9 % SODIUM CHLORIDE (POUR BTL) OPTIME
TOPICAL | Status: DC | PRN
  Administered 2013-01-31: 1000 mL

## 2013-01-31 MED ORDER — ONDANSETRON HCL 4 MG/2ML IJ SOLN
4.0000 mg | INTRAMUSCULAR | Status: DC | PRN
  Administered 2013-01-31: 4 mg via INTRAVENOUS
  Filled 2013-01-31: qty 2

## 2013-01-31 MED ORDER — ROCURONIUM BROMIDE 100 MG/10ML IV SOLN
INTRAVENOUS | Status: DC | PRN
  Administered 2013-01-31: 40 mg via INTRAVENOUS

## 2013-01-31 MED ORDER — HEMOSTATIC AGENTS (NO CHARGE) OPTIME
TOPICAL | Status: DC | PRN
  Administered 2013-01-31: 1 via TOPICAL

## 2013-01-31 MED ORDER — QUETIAPINE FUMARATE 50 MG PO TABS
50.0000 mg | ORAL_TABLET | Freq: Every day | ORAL | Status: DC
  Administered 2013-01-31 – 2013-02-01 (×2): 50 mg via ORAL
  Filled 2013-01-31 (×3): qty 1

## 2013-01-31 MED ORDER — THROMBIN 5000 UNITS EX SOLR
CUTANEOUS | Status: DC | PRN
  Administered 2013-01-31 (×2): 5000 [IU] via TOPICAL

## 2013-01-31 MED ORDER — HYDROMORPHONE HCL PF 1 MG/ML IJ SOLN
0.5000 mg | Freq: Once | INTRAMUSCULAR | Status: AC
  Administered 2013-01-31: 0.5 mg via INTRAVENOUS

## 2013-01-31 MED ORDER — SODIUM CHLORIDE 0.9 % IJ SOLN: 3.0000 mL | INTRAMUSCULAR | Status: DC | PRN

## 2013-01-31 MED ORDER — METOCLOPRAMIDE HCL 5 MG/ML IJ SOLN: 10.0000 mg | Freq: Once | INTRAMUSCULAR | Status: DC | PRN

## 2013-01-31 MED ORDER — ONDANSETRON HCL 4 MG/2ML IJ SOLN
INTRAMUSCULAR | Status: DC | PRN
  Administered 2013-01-31: 4 mg via INTRAMUSCULAR

## 2013-01-31 MED ORDER — ACETAMINOPHEN 325 MG PO TABS: 650.0000 mg | ORAL_TABLET | ORAL | Status: DC | PRN

## 2013-01-31 MED ORDER — LABETALOL HCL 5 MG/ML IV SOLN
INTRAVENOUS | Status: DC | PRN
  Administered 2013-01-31: 5 mg via INTRAVENOUS

## 2013-01-31 MED ORDER — LEVOTHYROXINE SODIUM 25 MCG PO TABS
25.0000 ug | ORAL_TABLET | ORAL | Status: DC
  Administered 2013-02-01: 25 ug via ORAL
  Filled 2013-01-31 (×2): qty 1

## 2013-01-31 MED ORDER — PANTOPRAZOLE SODIUM 40 MG IV SOLR
40.0000 mg | Freq: Every day | INTRAVENOUS | Status: DC
  Administered 2013-01-31: 40 mg via INTRAVENOUS
  Filled 2013-01-31 (×3): qty 40

## 2013-01-31 MED ORDER — LACTATED RINGERS IV SOLN
INTRAVENOUS | Status: DC | PRN
  Administered 2013-01-31 (×2): via INTRAVENOUS

## 2013-01-31 MED ORDER — MENTHOL 3 MG MT LOZG: 1.0000 | LOZENGE | OROMUCOSAL | Status: DC | PRN

## 2013-01-31 MED ORDER — KCL IN DEXTROSE-NACL 20-5-0.45 MEQ/L-%-% IV SOLN
80.0000 mL/h | INTRAVENOUS | Status: DC
  Administered 2013-01-31 – 2013-02-01 (×2): 80 mL/h via INTRAVENOUS
  Filled 2013-01-31 (×6): qty 1000

## 2013-01-31 MED ORDER — HYDROMORPHONE HCL PF 1 MG/ML IJ SOLN: 1.0000 mg | INTRAMUSCULAR | Status: DC | PRN

## 2013-01-31 MED ORDER — HYDROCODONE-ACETAMINOPHEN 5-325 MG PO TABS
1.0000 | ORAL_TABLET | ORAL | Status: DC | PRN
  Administered 2013-01-31: 2 via ORAL
  Administered 2013-01-31: 1 via ORAL
  Administered 2013-02-01: 2 via ORAL
  Administered 2013-02-01: 1 via ORAL
  Administered 2013-02-02: 2 via ORAL
  Filled 2013-01-31: qty 2
  Filled 2013-01-31 (×2): qty 1
  Filled 2013-01-31: qty 2

## 2013-01-31 MED ORDER — SODIUM CHLORIDE 0.9 % IR SOLN
Status: DC | PRN
  Administered 2013-01-31: 08:00:00

## 2013-01-31 MED ORDER — GLYCOPYRROLATE 0.2 MG/ML IJ SOLN
INTRAMUSCULAR | Status: DC | PRN
  Administered 2013-01-31: .8 mg via INTRAVENOUS

## 2013-01-31 MED ORDER — FENTANYL CITRATE 0.05 MG/ML IJ SOLN
25.0000 ug | INTRAMUSCULAR | Status: DC | PRN
  Administered 2013-01-31: 25 ug via INTRAVENOUS
  Administered 2013-01-31 (×2): 50 ug via INTRAVENOUS
  Administered 2013-01-31: 25 ug via INTRAVENOUS

## 2013-01-31 MED ORDER — PHENOL 1.4 % MT LIQD: 1.0000 | OROMUCOSAL | Status: DC | PRN

## 2013-01-31 MED ORDER — LEVOTHYROXINE SODIUM 50 MCG PO TABS
50.0000 ug | ORAL_TABLET | ORAL | Status: DC
  Administered 2013-01-31 – 2013-02-02 (×2): 50 ug via ORAL
  Filled 2013-01-31 (×2): qty 1

## 2013-01-31 MED ORDER — ARTIFICIAL TEARS OP OINT
TOPICAL_OINTMENT | OPHTHALMIC | Status: DC | PRN
  Administered 2013-01-31: 1 via OPHTHALMIC

## 2013-01-31 MED ORDER — CEFAZOLIN SODIUM 1-5 GM-% IV SOLN
1.0000 g | Freq: Three times a day (TID) | INTRAVENOUS | Status: AC
  Administered 2013-01-31 (×2): 1 g via INTRAVENOUS
  Filled 2013-01-31 (×2): qty 50

## 2013-01-31 MED ORDER — LISINOPRIL 10 MG PO TABS
10.0000 mg | ORAL_TABLET | Freq: Every day | ORAL | Status: DC
  Administered 2013-01-31 – 2013-02-01 (×2): 10 mg via ORAL
  Filled 2013-01-31 (×3): qty 1

## 2013-01-31 MED ORDER — KETOROLAC TROMETHAMINE 15 MG/ML IJ SOLN
INTRAMUSCULAR | Status: DC | PRN
  Administered 2013-01-31: 15 mg via INTRAVENOUS

## 2013-01-31 MED ORDER — DEXAMETHASONE SODIUM PHOSPHATE 4 MG/ML IJ SOLN
4.0000 mg | Freq: Four times a day (QID) | INTRAMUSCULAR | Status: AC
  Administered 2013-01-31: 4 mg via INTRAVENOUS
  Filled 2013-01-31: qty 1

## 2013-01-31 SURGICAL SUPPLY — 47 items
BAG DECANTER FOR FLEXI CONT (MISCELLANEOUS) ×2 IMPLANT
BENZOIN TINCTURE PRP APPL 2/3 (GAUZE/BANDAGES/DRESSINGS) ×2 IMPLANT
BLADE SURG ROTATE 9660 (MISCELLANEOUS) ×2 IMPLANT
BRUSH SCRUB EZ PLAIN DRY (MISCELLANEOUS) ×2 IMPLANT
BUR CUTTER 7.0 ROUND (BURR) ×2 IMPLANT
BUR MATCHSTICK NEURO 3.0 LAGG (BURR) ×2 IMPLANT
CANISTER SUCTION 2500CC (MISCELLANEOUS) ×2 IMPLANT
CONT SPEC 4OZ CLIKSEAL STRL BL (MISCELLANEOUS) ×2 IMPLANT
DERMABOND ADVANCED (GAUZE/BANDAGES/DRESSINGS) ×1
DERMABOND ADVANCED .7 DNX12 (GAUZE/BANDAGES/DRESSINGS) ×1 IMPLANT
DRAPE LAPAROTOMY 100X72X124 (DRAPES) ×2 IMPLANT
DRAPE MICROSCOPE LEICA (MISCELLANEOUS) ×2 IMPLANT
DRAPE MICROSCOPE ZEISS OPMI (DRAPES) IMPLANT
DRAPE SURG 17X23 STRL (DRAPES) ×4 IMPLANT
DRESSING TELFA 8X3 (GAUZE/BANDAGES/DRESSINGS) ×2 IMPLANT
DURAPREP 26ML APPLICATOR (WOUND CARE) ×2 IMPLANT
ELECT REM PT RETURN 9FT ADLT (ELECTROSURGICAL) ×2
ELECTRODE REM PT RTRN 9FT ADLT (ELECTROSURGICAL) ×1 IMPLANT
GAUZE SPONGE 4X4 16PLY XRAY LF (GAUZE/BANDAGES/DRESSINGS) IMPLANT
GLOVE BIO SURGEON STRL SZ8.5 (GLOVE) ×2 IMPLANT
GLOVE BIOGEL PI IND STRL 8 (GLOVE) ×1 IMPLANT
GLOVE BIOGEL PI INDICATOR 8 (GLOVE) ×1
GLOVE ECLIPSE 7.5 STRL STRAW (GLOVE) ×6 IMPLANT
GLOVE ECLIPSE 8.0 STRL XLNG CF (GLOVE) ×2 IMPLANT
GLOVE SS BIOGEL STRL SZ 8 (GLOVE) ×1 IMPLANT
GLOVE SUPERSENSE BIOGEL SZ 8 (GLOVE) ×1
GOWN BRE IMP SLV AUR LG STRL (GOWN DISPOSABLE) IMPLANT
GOWN BRE IMP SLV AUR XL STRL (GOWN DISPOSABLE) ×4 IMPLANT
GOWN STRL REIN 2XL LVL4 (GOWN DISPOSABLE) ×2 IMPLANT
KIT BASIN OR (CUSTOM PROCEDURE TRAY) ×2 IMPLANT
KIT ROOM TURNOVER OR (KITS) ×2 IMPLANT
NEEDLE HYPO 22GX1.5 SAFETY (NEEDLE) ×2 IMPLANT
NEEDLE SPNL 22GX3.5 QUINCKE BK (NEEDLE) ×4 IMPLANT
NS IRRIG 1000ML POUR BTL (IV SOLUTION) ×2 IMPLANT
PACK LAMINECTOMY NEURO (CUSTOM PROCEDURE TRAY) ×2 IMPLANT
PAD ARMBOARD 7.5X6 YLW CONV (MISCELLANEOUS) ×6 IMPLANT
PATTIES SURGICAL .75X.75 (GAUZE/BANDAGES/DRESSINGS) ×2 IMPLANT
RUBBERBAND STERILE (MISCELLANEOUS) ×4 IMPLANT
SPONGE GAUZE 4X4 12PLY (GAUZE/BANDAGES/DRESSINGS) ×2 IMPLANT
SPONGE SURGIFOAM ABS GEL SZ50 (HEMOSTASIS) ×2 IMPLANT
STRIP CLOSURE SKIN 1/2X4 (GAUZE/BANDAGES/DRESSINGS) ×2 IMPLANT
SUT VIC AB 2-0 OS6 18 (SUTURE) ×6 IMPLANT
SUT VIC AB 3-0 CP2 18 (SUTURE) ×2 IMPLANT
SYR 20ML ECCENTRIC (SYRINGE) ×2 IMPLANT
TOWEL OR 17X24 6PK STRL BLUE (TOWEL DISPOSABLE) ×2 IMPLANT
TOWEL OR 17X26 10 PK STRL BLUE (TOWEL DISPOSABLE) ×2 IMPLANT
WATER STERILE IRR 1000ML POUR (IV SOLUTION) ×2 IMPLANT

## 2013-01-31 NOTE — H&P (Signed)
Tara Hicks is an 79 y.o. female.   Chief Complaint: Left leg pain HPI: The patient is an 79 year old female who was evaluated in the office for a few month history of left leg pain. His gun progressively severe to the point where she is unable to ambulate space wheelchair bound. She underwent a CT scan which showed a very large synovial cyst L5-S1 on the left. After discussing the options the patient requested surgery and now comes for a laminotomy with removal of her large synovial cyst. I've had a long discussion with her regarding the risks and benefits of surgical intervention. The risks discussed include but are not limited to bleeding infection weakness numbness paralysis spinal fluid leak coma and death. We have discussed alternative methods of therapy along with the risks and benefits of nonintervention. She's had the opportunity to ask numerous questions and appears to understand. With this information in hand she has requested we proceed with surgery.  Past Medical History  Diagnosis Date  . Cataract   . Hypertension   . Complication of anesthesia   . PONV (postoperative nausea and vomiting)   . TIA (transient ischemic attack)     Hx: of 2014  . Hypothyroidism   . Dementia   . Leaky heart valve     Hx: of  . GERD (gastroesophageal reflux disease)   . Arthritis     Past Surgical History  Procedure Laterality Date  . Cataract extraction    . Dilation and curettage of uterus    . Colonoscopy      Hx: of  . Breast surgery      Hx: of left breast cyst removal    Family History  Problem Relation Age of Onset  . Diabetes Brother   . Hypertension Brother   . Cancer - Colon Sister    Social History:  reports that she has never smoked. She has never used smokeless tobacco. She reports that she does not drink alcohol or use illicit drugs.  Allergies:  Allergies  Allergen Reactions  . Phenergan [Promethazine] Other (See Comments)    hyperactive    Medications Prior to  Admission  Medication Sig Dispense Refill  . Ascorbic Acid (VITAMIN C) 1000 MG tablet Take 1,000 mg by mouth daily.      Marland Kitchen atorvastatin (LIPITOR) 20 MG tablet Take 20 mg by mouth every evening.       . celecoxib (CELEBREX) 200 MG capsule Take 200 mg by mouth 2 (two) times daily.      Marland Kitchen HYDROcodone-acetaminophen (NORCO/VICODIN) 5-325 MG per tablet Take 1 tablet by mouth every 6 (six) hours as needed for pain.      Marland Kitchen levothyroxine (SYNTHROID, LEVOTHROID) 25 MCG tablet Take 25 mcg by mouth 4 (four) times a week. Take on Tues, Thurs, Sat, Sun      . levothyroxine (SYNTHROID, LEVOTHROID) 50 MCG tablet Take 50 mcg by mouth 3 (three) times a week. Take on Mon, Wed, Fri      . lisinopril (PRINIVIL,ZESTRIL) 10 MG tablet Take 10 mg by mouth daily.      . Multiple Vitamin (MULTIVITAMIN WITH MINERALS) TABS tablet Take 1 tablet by mouth daily.      . ondansetron (ZOFRAN) 4 MG tablet Take 4 mg by mouth every 8 (eight) hours as needed for nausea.      Marland Kitchen QUEtiapine (SEROQUEL) 50 MG tablet Take 50 mg by mouth at bedtime.      . rivastigmine (EXELON) 1.5 MG capsule Take 1.5 mg by  mouth 2 (two) times daily.      . traMADol (ULTRAM) 50 MG tablet Take 50 mg by mouth every 6 (six) hours as needed for pain.      . vitamin B-12 (CYANOCOBALAMIN) 1000 MCG tablet Take 1,000 mcg by mouth daily.      . Vitamin D, Ergocalciferol, (DRISDOL) 50000 UNITS CAPS capsule Take 50,000 Units by mouth every 7 (seven) days. On Wednesdays      . clopidogrel (PLAVIX) 75 MG tablet Take 75 mg by mouth daily.        No results found for this or any previous visit (from the past 48 hour(s)). No results found.  Review of systems not obtained due to patient factors.  Blood pressure 128/56, pulse 57, temperature 96.5 F (35.8 C), temperature source Oral, resp. rate 18, SpO2 97.00%.  The patient is awake or and oriented. His difficulty walking due to severe pain that can walk a few steps with marked antalgic. Her reflexes are 1+ at the  knees absent ankles and equal bilaterally. Her strength however is 5 over 5. Assessment/Plan Impression is that of a large synovial cyst L5-S1 on the left. The plan is for a laminotomy with removal.  Reinaldo Meeker, MD 01/31/2013, 7:40 AM

## 2013-01-31 NOTE — Op Note (Signed)
Preop diagnosis: Synovial cyst L5-S1 left Postop diagnosis: Same Procedure: Left L5-S1 intralaminar laminotomy with removal of synovial cyst Surgeon: Breiona Couvillon Assistant: Lovell Sheehan  After and placed the prone position the patient's back was prepped and draped in the usual sterile fashion. Linear incision was made above the spinous processes of L5 and S1. Using Bovie cutting current the incision was carried on the spinous processes. Subperiosteal dissection was then carried out on the left side the spinous processes and lamina facet joint subcutaneous tract was placed for exposure. X-ray showed approach the appropriate level. Using the high-speed drill the inferior one half of the L5 lamina the medial one third of the facet joint and the superior one third of the S1 lamina were removed. Residual bone and ligamentum flavum removed in a piecemeal fashion. The microscope was draped brought in the field and used for the remainder of the case. Using microdissection technique the lateral aspect of the thecal sac and was identified. Large cyst was noted lateral to it with compression of the thecal sac and S1 nerve root. This was dissected free of the thecal sac and removed in a piecemeal fashion. S1 nerve was followed out its foramen until was well decompressed and visualized. Superior dissection was carried out as well. There 1 partial-thickness rent in the dura but no spinal fluid was noted and noticed it was placed because we did not want that mass layer. At this time we inspected all directions for any evidence of residual compression and none could be identified. Irrigation was carried out and any bleeding control proper coagulation and Gelfoam. The was then closed in multiple layers of Vicryl on the muscle fascia subcutaneous and subcuticular tissues. Dermabond Steri-Strips were placed on the skin. A sterile dressing was then applied and the patient was extubated and taken to recovery room in stable condition.

## 2013-01-31 NOTE — Plan of Care (Signed)
Problem: Consults Goal: Diagnosis - Spinal Surgery Outcome: Completed/Met Date Met:  01/31/13 Lumbar Laminectomy (Complex) for synovial cyst

## 2013-01-31 NOTE — Anesthesia Postprocedure Evaluation (Signed)
Anesthesia Post Note  Patient: Tara Hicks  Procedure(s) Performed: Procedure(s) (LRB): Left lumbar five-sacral one laminectomy for synovial cyst (Left)  Anesthesia type: General  Patient location: PACU  Post pain: Pain level controlled  Post assessment: Patient's Cardiovascular Status Stable  Last Vitals:  Filed Vitals:   01/31/13 1030  BP:   Pulse: 66  Temp: 36.1 C  Resp: 18    Post vital signs: Reviewed and stable  Level of consciousness: alert  Complications: No apparent anesthesia complications

## 2013-01-31 NOTE — Preoperative (Signed)
Beta Blockers   Reason not to administer Beta Blockers:Not Applicable 

## 2013-01-31 NOTE — Progress Notes (Signed)
UR COMPLETED  

## 2013-01-31 NOTE — Progress Notes (Signed)
Dr. Justin Mend aware of patient continuing to ask for pain meds, Verbal order for Dilaudid recieved

## 2013-01-31 NOTE — Anesthesia Preprocedure Evaluation (Signed)
Anesthesia Evaluation  Patient identified by MRN, date of birth, ID band Patient awake    Reviewed: Allergy & Precautions, H&P , NPO status , Patient's Chart, lab work & pertinent test results, reviewed documented beta blocker date and time   History of Anesthesia Complications (+) PONV and history of anesthetic complications  Airway Mallampati: II TM Distance: >3 FB Neck ROM: full    Dental   Pulmonary neg pulmonary ROS,  breath sounds clear to auscultation        Cardiovascular hypertension, On Medications Rhythm:regular     Neuro/Psych PSYCHIATRIC DISORDERS TIA   GI/Hepatic Neg liver ROS, GERD-  Medicated and Controlled,  Endo/Other  Hypothyroidism   Renal/GU negative Renal ROS  negative genitourinary   Musculoskeletal   Abdominal   Peds  Hematology negative hematology ROS (+)   Anesthesia Other Findings See surgeon's H&P   Reproductive/Obstetrics negative OB ROS                           Anesthesia Physical Anesthesia Plan  ASA: III  Anesthesia Plan: General   Post-op Pain Management:    Induction: Intravenous  Airway Management Planned: Oral ETT  Additional Equipment:   Intra-op Plan:   Post-operative Plan: Extubation in OR  Informed Consent: I have reviewed the patients History and Physical, chart, labs and discussed the procedure including the risks, benefits and alternatives for the proposed anesthesia with the patient or authorized representative who has indicated his/her understanding and acceptance.   Dental Advisory Given  Plan Discussed with: CRNA and Surgeon  Anesthesia Plan Comments:         Anesthesia Quick Evaluation

## 2013-01-31 NOTE — Transfer of Care (Signed)
Immediate Anesthesia Transfer of Care Note  Patient: Tara Hicks  Procedure(s) Performed: Procedure(s): Left lumbar five-sacral one laminectomy for synovial cyst (Left)  Patient Location: PACU  Anesthesia Type:General  Level of Consciousness: awake, alert , oriented and patient cooperative  Airway & Oxygen Therapy: Patient Spontanous Breathing and Patient connected to nasal cannula oxygen  Post-op Assessment: Report given to PACU RN, Post -op Vital signs reviewed and stable and Patient moving all extremities X 4  Post vital signs: Reviewed and stable  Complications: No apparent anesthesia complications

## 2013-02-01 NOTE — Progress Notes (Signed)
Patient ID: Tara Hicks, female   DOB: [birth year], 79 y.o.   MRN: [id number] Afeb, vss No legf pain Wound clean and dry. Will start to increase activity slowly today. If does well with that, will plan for d/c tomorrow. She is  Very pleased with her situation at present.

## 2013-02-02 ENCOUNTER — Encounter (HOSPITAL_COMMUNITY): Payer: Self-pay | Admitting: Neurosurgery

## 2013-02-02 MED ORDER — HYDROCODONE-ACETAMINOPHEN 5-325 MG PO TABS: 1.0000 | ORAL_TABLET | ORAL | Status: DC | PRN

## 2013-02-02 NOTE — Progress Notes (Signed)
Pt. Alert and oriented,follows simple instructions, denies pain. Incision area without swelling, redness or S/S of infection. Voiding adequate clear yellow urine. Moving all extremities well and vitals stable and documented.  Patient discharged home with son. Lumbar surgery notes instructions given to patient and family member for home safety and precautions. Pt. and family stated understanding of instructions given.

## 2013-02-02 NOTE — Discharge Summary (Signed)
Physician Discharge Summary  Patient ID: Ethelle Ola MRN: [id number] DOB/AGE: [id number] 77 y.o.  Admit date: 01/31/2013 Discharge date: 02/02/2013  Admission Diagnoses:  Discharge Diagnoses:  Active Problems:   * No active hospital problems. *   Discharged Condition: good  Hospital Course: Surgery for synovial cyst. Did well. Pain markedly improved. Ambulated well. Home pod 2, specific instructions given.  Consults: None  Significant Diagnostic Studies: none  Treatments: surgery: L5 S1 lam for synovial cyst  Discharge Exam: Blood pressure 175/86, pulse 84, temperature 97.9 F (36.6 C), temperature source Oral, resp. rate 20, SpO2 96.00%. Incision/Wound:clean and dry; no new neuro issues  Disposition: 01-Home or Self Care     Medication List    ASK your doctor about these medications       atorvastatin 20 MG tablet  Commonly known as:  LIPITOR  Take 20 mg by mouth every evening.     celecoxib 200 MG capsule  Commonly known as:  CELEBREX  Take 200 mg by mouth 2 (two) times daily.     clopidogrel 75 MG tablet  Commonly known as:  PLAVIX  Take 75 mg by mouth daily.     HYDROcodone-acetaminophen 5-325 MG per tablet  Commonly known as:  NORCO/VICODIN  Take 1 tablet by mouth every 6 (six) hours as needed for pain.     levothyroxine 50 MCG tablet  Commonly known as:  SYNTHROID, LEVOTHROID  Take 50 mcg by mouth 3 (three) times a week. Take on Mon, Wed, Fri     levothyroxine 25 MCG tablet  Commonly known as:  SYNTHROID, LEVOTHROID  Take 25 mcg by mouth 4 (four) times a week. Take on Tues, Thurs, Sat, Sun     lisinopril 10 MG tablet  Commonly known as:  PRINIVIL,ZESTRIL  Take 10 mg by mouth daily.     multivitamin with minerals Tabs tablet  Take 1 tablet by mouth daily.     ondansetron 4 MG tablet  Commonly known as:  ZOFRAN  Take 4 mg by mouth every 8 (eight) hours as needed for nausea.     QUEtiapine 50 MG tablet  Commonly known as:  SEROQUEL   Take 50 mg by mouth at bedtime.     rivastigmine 1.5 MG capsule  Commonly known as:  EXELON  Take 1.5 mg by mouth 2 (two) times daily.     traMADol 50 MG tablet  Commonly known as:  ULTRAM  Take 50 mg by mouth every 6 (six) hours as needed for pain.     vitamin B-12 1000 MCG tablet  Commonly known as:  CYANOCOBALAMIN  Take 1,000 mcg by mouth daily.     vitamin C 1000 MG tablet  Take 1,000 mg by mouth daily.     Vitamin D (Ergocalciferol) 50000 UNITS Caps capsule  Commonly known as:  DRISDOL  Take 50,000 Units by mouth every 7 (seven) days. On Wednesdays         At home rest most of the time. Get up 9 or 10 times each day and take a 15 or 20 minute walk. No riding in the car and to your first postoperative appointment. If you have neck surgery you may shower from the chest down starting on the third postoperative day. If you had back surgery he may start showering on the third postoperative day with saran wrap wrapped around your incisional area 3 times. After the shower remove the saran wrap. Take pain medicine as needed and other medications as instructed. Call my  office for an appointment.  SignedReinaldo Meeker, MD 02/02/2013, 8:54 AM

## 2013-09-24 ENCOUNTER — Emergency Department (HOSPITAL_BASED_OUTPATIENT_CLINIC_OR_DEPARTMENT_OTHER)
Admission: EM | Admit: 2013-09-24 | Discharge: 2013-09-24 | Disposition: A | Discharge status: Home / Self Care | Payer: Medicare Other | Attending: Emergency Medicine | Admitting: Emergency Medicine

## 2013-09-24 ENCOUNTER — Emergency Department (HOSPITAL_BASED_OUTPATIENT_CLINIC_OR_DEPARTMENT_OTHER): Payer: Medicare Other

## 2013-09-24 ENCOUNTER — Encounter (HOSPITAL_BASED_OUTPATIENT_CLINIC_OR_DEPARTMENT_OTHER): Payer: Self-pay | Admitting: Emergency Medicine

## 2013-09-24 DIAGNOSIS — Z7902 Long term (current) use of antithrombotics/antiplatelets: Secondary | ICD-10-CM | POA: Insufficient documentation

## 2013-09-24 DIAGNOSIS — R072 Precordial pain: Secondary | ICD-10-CM | POA: Insufficient documentation

## 2013-09-24 DIAGNOSIS — I10 Essential (primary) hypertension: Secondary | ICD-10-CM | POA: Insufficient documentation

## 2013-09-24 DIAGNOSIS — E039 Hypothyroidism, unspecified: Secondary | ICD-10-CM | POA: Insufficient documentation

## 2013-09-24 DIAGNOSIS — Z79899 Other long term (current) drug therapy: Secondary | ICD-10-CM | POA: Insufficient documentation

## 2013-09-24 DIAGNOSIS — Z8669 Personal history of other diseases of the nervous system and sense organs: Secondary | ICD-10-CM | POA: Insufficient documentation

## 2013-09-24 DIAGNOSIS — R079 Chest pain, unspecified: Secondary | ICD-10-CM

## 2013-09-24 DIAGNOSIS — M129 Arthropathy, unspecified: Secondary | ICD-10-CM | POA: Insufficient documentation

## 2013-09-24 DIAGNOSIS — Z791 Long term (current) use of non-steroidal anti-inflammatories (NSAID): Secondary | ICD-10-CM | POA: Insufficient documentation

## 2013-09-24 DIAGNOSIS — M25519 Pain in unspecified shoulder: Secondary | ICD-10-CM | POA: Insufficient documentation

## 2013-09-24 DIAGNOSIS — Z8673 Personal history of transient ischemic attack (TIA), and cerebral infarction without residual deficits: Secondary | ICD-10-CM | POA: Insufficient documentation

## 2013-09-24 DIAGNOSIS — F0390 Unspecified dementia without behavioral disturbance: Secondary | ICD-10-CM | POA: Insufficient documentation

## 2013-09-24 DIAGNOSIS — Z8719 Personal history of other diseases of the digestive system: Secondary | ICD-10-CM | POA: Insufficient documentation

## 2013-09-24 LAB — BASIC METABOLIC PANEL
BUN: 20 mg/dL (ref 6–23)
CO2: 24 mEq/L (ref 19–32)
CREATININE: 0.8 mg/dL (ref 0.50–1.10)
Calcium: 10.2 mg/dL (ref 8.4–10.5)
Chloride: 100 mEq/L (ref 96–112)
GFR calc Af Amer: 74 mL/min — ABNORMAL LOW (ref >90)
GFR calc non Af Amer: 64 mL/min — ABNORMAL LOW (ref >90)
GLUCOSE: 105 mg/dL — AB (ref 70–99)
Potassium: 4.7 mEq/L (ref 3.7–5.3)
Sodium: 136 mEq/L — ABNORMAL LOW (ref 137–147)

## 2013-09-24 LAB — CBC WITH DIFFERENTIAL/PLATELET
Basophils Absolute: 0 10*3/uL (ref 0.0–0.1)
Basophils Relative: 0 % (ref 0–1)
EOS ABS: 0.1 10*3/uL (ref 0.0–0.7)
EOS PCT: 1 % (ref 0–5)
HCT: 38 % (ref 36.0–46.0)
HEMOGLOBIN: 12.6 g/dL (ref 12.0–15.0)
LYMPHS ABS: 0.9 10*3/uL (ref 0.7–4.0)
Lymphocytes Relative: 11 % — ABNORMAL LOW (ref 12–46)
MCH: 27.6 pg (ref 26.0–34.0)
MCHC: 33.2 g/dL (ref 30.0–36.0)
MCV: 83.3 fL (ref 78.0–100.0)
MONOS PCT: 8 % (ref 3–12)
Monocytes Absolute: 0.7 10*3/uL (ref 0.1–1.0)
Neutro Abs: 6.8 10*3/uL (ref 1.7–7.7)
Neutrophils Relative %: 80 % — ABNORMAL HIGH (ref 43–77)
Platelets: 215 10*3/uL (ref 150–400)
RBC: 4.56 MIL/uL (ref 3.87–5.11)
RDW: 13.5 % (ref 11.5–15.5)
WBC: 8.6 10*3/uL (ref 4.0–10.5)

## 2013-09-24 LAB — PRO B NATRIURETIC PEPTIDE: Pro B Natriuretic peptide (BNP): 138.8 pg/mL (ref 0–450)

## 2013-09-24 LAB — TROPONIN I: Troponin I: <0.3 ng/mL (ref <0.30)

## 2013-09-24 MED ORDER — ASPIRIN 325 MG PO TABS
325.0000 mg | ORAL_TABLET | Freq: Once | ORAL | Status: AC
  Administered 2013-09-24: 325 mg via ORAL
  Filled 2013-09-24: qty 1

## 2013-09-24 NOTE — Discharge Instructions (Signed)
Chest Pain (Nonspecific) °Chest pain has many causes. Your pain could be caused by something serious, such as a heart attack or a blood clot in the lungs. It could also be caused by something less serious, such as a chest bruise or a virus. Follow up with your doctor. More lab tests or other studies may be needed to find the cause of your pain. Most of the time, nonspecific chest pain will improve within 2 to 3 days of rest and mild pain medicine. °HOME CARE °· For chest bruises, you may put ice on the sore area for 15-20 minutes, 03-04 times a day. Do this only if it makes you feel better. °· Put ice in a plastic bag. °· Place a towel between the skin and the bag. °· Rest for the next 2 to 3 days. °· Go back to work if the pain improves. °· See your doctor if the pain lasts longer than 1 to 2 weeks. °· Only take medicine as told by your doctor. °· Quit smoking if you smoke. °GET HELP RIGHT AWAY IF:  °· There is more pain or pain that spreads to the arm, neck, jaw, back, or belly (abdomen). °· You have shortness of breath. °· You cough more than usual or cough up blood. °· You have very bad back or belly pain, feel sick to your stomach (nauseous), or throw up (vomit). °· You have very bad weakness. °· You pass out (faint). °· You have a fever. °Any of these problems may be serious and may be an emergency. Do not wait to see if the problems will go away. Get medical help right away. Call your local emergency services 911 in [place] Do not drive yourself to the hospital. °MAKE SURE YOU:  °· Understand these instructions. °· Will watch this condition. °· Will get help right away if you or your child is not doing well or gets worse. °Document Released: 09/24/2007 Document Revised: 06/30/2011 Document Reviewed: 09/24/2007 °[employer]® Patient Information ©2014 [employer]. ° °

## 2013-09-24 NOTE — ED Notes (Signed)
Patient and son informed MD that they wished to leave and not continue additional tests, patient and son instructed to return to ER and/or inform staff at assisted living facility. Son verbalized understanding

## 2013-09-24 NOTE — ED Notes (Signed)
Per EMS, patient c/o sudden onset L shoulder and chest pain at lunch and staff called 911. Pain resolved while at lunch and patient refused transport, but per EMS they had to transport patient  Since they could not reach family. No pain, no SOB.   at this time, glucose 89 on truck, 152/83, 60-hr, RR - 16 during transport

## 2013-09-26 NOTE — ED Provider Notes (Signed)
CSN: [id number]     Arrival date & time 09/24/13  1209 History   First MD Initiated Contact with Patient 09/24/13 1237     Chief Complaint  Patient presents with  . Chest Pain     (Consider location/radiation/quality/duration/timing/severity/associated sxs/prior Treatment) Patient is a 80 y.o. female presenting with chest pain. The history is provided by the patient, a relative and the EMS personnel. No language interpreter was used.  Chest Pain Pain location:  Substernal area Pain quality: tightness   Pain radiates to:  L arm Pain radiates to the back: no   Pain severity:  Moderate Onset quality:  Sudden Duration:  30 minutes Timing:  Constant Progression:  Resolved Chronicity:  Recurrent Context: at rest   Relieved by:  Nothing Worsened by:  Nothing tried Ineffective treatments:  None tried Associated symptoms: no abdominal pain, no back pain, no cough, no diaphoresis, no fatigue, no fever, no headache, no lower extremity edema, no nausea, no numbness, no palpitations, no shortness of breath, not vomiting and no weakness   Risk factors: hypertension     Past Medical History  Diagnosis Date  . Cataract   . Hypertension   . Complication of anesthesia   . PONV (postoperative nausea and vomiting)   . TIA (transient ischemic attack)     Hx: of 2014  . Hypothyroidism   . Dementia   . Leaky heart valve     Hx: of  . GERD (gastroesophageal reflux disease)   . Arthritis    Past Surgical History  Procedure Laterality Date  . Cataract extraction    . Dilation and curettage of uterus    . Colonoscopy      Hx: of  . Breast surgery      Hx: of left breast cyst removal  . Lumbar laminectomy/decompression microdiscectomy Left 01/31/2013    Procedure: Left lumbar five-sacral one laminectomy for synovial cyst;  Surgeon: Reinaldo Meeker, MD;  Location: [facility];  Service: Neurosurgery;  Laterality: Left;   Family History  Problem Relation Age of Onset  . Diabetes Brother    . Hypertension Brother   . Cancer - Colon Sister    History  Substance Use Topics  . Smoking status: Never Smoker   . Smokeless tobacco: Never Used  . Alcohol Use: No   OB History   Grav Para Term Preterm Abortions TAB SAB Ect Mult Living                 Review of Systems  Constitutional: Negative for fever, chills, diaphoresis, activity change, appetite change and fatigue.  HENT: Negative for congestion, facial swelling, rhinorrhea and sore throat.   Eyes: Negative for photophobia and discharge.  Respiratory: Negative for cough, chest tightness and shortness of breath.   Cardiovascular: Positive for chest pain. Negative for palpitations and leg swelling.  Gastrointestinal: Negative for nausea, vomiting, abdominal pain and diarrhea.  Endocrine: Negative for polydipsia and polyuria.  Genitourinary: Negative for dysuria, frequency, difficulty urinating and pelvic pain.  Musculoskeletal: Negative for arthralgias, back pain, neck pain and neck stiffness.  Skin: Negative for color change and wound.  Allergic/Immunologic: Negative for immunocompromised state.  Neurological: Negative for facial asymmetry, weakness, numbness and headaches.  Hematological: Does not bruise/bleed easily.  Psychiatric/Behavioral: Negative for confusion and agitation.      Allergies  Phenergan  Home Medications   Prior to Admission medications   Medication Sig Start Date End Date Taking? Authorizing Provider  Ascorbic Acid (VITAMIN C) 1000 MG tablet  Take 1,000 mg by mouth daily.    Historical Provider, MD  atorvastatin (LIPITOR) 20 MG tablet Take 20 mg by mouth every evening.     Historical Provider, MD  celecoxib (CELEBREX) 200 MG capsule Take 200 mg by mouth 2 (two) times daily.    Historical Provider, MD  clopidogrel (PLAVIX) 75 MG tablet Take 75 mg by mouth daily.    Historical Provider, MD  HYDROcodone-acetaminophen (NORCO/VICODIN) 5-325 MG per tablet Take 1 tablet by mouth every 6 (six) hours  as needed for pain. 01/09/13   Vanetta MuldersScott Zackowski, MD  HYDROcodone-acetaminophen (NORCO/VICODIN) 5-325 MG per tablet Take 1-2 tablets by mouth every 4 (four) hours as needed. 02/02/13   Reinaldo Meekerandy O Kritzer, MD  levothyroxine (SYNTHROID, LEVOTHROID) 25 MCG tablet Take 25 mcg by mouth 4 (four) times a week. Take on Tues, Thurs, Sat, Sun    Historical Provider, MD  levothyroxine (SYNTHROID, LEVOTHROID) 50 MCG tablet Take 50 mcg by mouth 3 (three) times a week. Take on Mon, Wed, Fri    Historical Provider, MD  lisinopril (PRINIVIL,ZESTRIL) 10 MG tablet Take 10 mg by mouth daily.    Historical Provider, MD  Multiple Vitamin (MULTIVITAMIN WITH MINERALS) TABS tablet Take 1 tablet by mouth daily.    Historical Provider, MD  ondansetron (ZOFRAN) 4 MG tablet Take 4 mg by mouth every 8 (eight) hours as needed for nausea.    Historical Provider, MD  QUEtiapine (SEROQUEL) 50 MG tablet Take 50 mg by mouth at bedtime.    Historical Provider, MD  rivastigmine (EXELON) 1.5 MG capsule Take 1.5 mg by mouth 2 (two) times daily.    Historical Provider, MD   BP 142/82  Pulse 60  Temp(Src) 98.2 F (36.8 C) (Oral)  Resp 16  SpO2 96% Physical Exam  Constitutional: She is oriented to person, place, and time. She appears well-developed and well-nourished. No distress.  HENT:  Head: Normocephalic and atraumatic.  Mouth/Throat: No oropharyngeal exudate.  Eyes: Pupils are equal, round, and reactive to light.  Neck: Normal range of motion. Neck supple.  Cardiovascular: Normal rate, regular rhythm and normal heart sounds.  Exam reveals no gallop and no friction rub.   No murmur heard. Pulmonary/Chest: Effort normal and breath sounds normal. No respiratory distress. She has no wheezes. She has no rales.  Abdominal: Soft. Bowel sounds are normal. She exhibits no distension and no mass. There is no tenderness. There is no rebound and no guarding.  Musculoskeletal: Normal range of motion. She exhibits no edema and no tenderness.   Neurological: She is alert and oriented to person, place, and time.  Skin: Skin is warm and dry.  Psychiatric: She has a normal mood and affect.    ED Course  Procedures (including critical care time) Labs Review Labs Reviewed  CBC WITH DIFFERENTIAL - Abnormal; Notable for the following:    Neutrophils Relative % 80 (*)    Lymphocytes Relative 11 (*)    All other components within normal limits  BASIC METABOLIC PANEL - Abnormal; Notable for the following:    Sodium 136 (*)    Glucose, Bld 105 (*)    GFR calc non Af Amer 64 (*)    GFR calc Af Amer 74 (*)    All other components within normal limits  TROPONIN I  PRO B NATRIURETIC PEPTIDE    Imaging Review No results found.   EKG Interpretation   Date/Time:  Saturday September 24 2013 12:20:18 EDT Ventricular Rate:  63 PR Interval:  170 QRS  Duration: 106 QT Interval:  404 QTC Calculation: 413 R Axis:   -18 Text Interpretation:  Normal sinus rhythm Incomplete right bundle branch  block Moderate voltage criteria for LVH, may be normal variant No  significant change since last tracing Confirmed by Billyjack Trompeter  MD, Altamese Deguire  [phone]) on 09/24/2013 12:36:41 PM      MDM   Final diagnoses:  Chest pain    Pt is a 80 y.o. female with Pmhx as above who presents with report of about 30 mins of L shoulder and chest pain after lunch at her SNF. She has not complaints currently and is unhappy with her transport to the ED. No acute findings oc CXR, EKG or first trop. I have discussed my wish for her to stay for admission for ACS r/o or at least for delta trop 6 hrs after symptoms onset. Initally, family decided to stay for delta trop, but then pt decided she would like to go. Pt & son understand I cannot fully r/o ACS in this situation, agree to return for further episodes.         Shanna Cisco, MD 09/26/13 [phone]

## 2014-07-08 ENCOUNTER — Emergency Department (HOSPITAL_BASED_OUTPATIENT_CLINIC_OR_DEPARTMENT_OTHER)
Admission: EM | Admit: 2014-07-08 | Discharge: 2014-07-08 | Disposition: A | Discharge status: Home / Self Care | Payer: Medicare Other | Attending: Emergency Medicine | Admitting: Emergency Medicine

## 2014-07-08 ENCOUNTER — Encounter (HOSPITAL_BASED_OUTPATIENT_CLINIC_OR_DEPARTMENT_OTHER): Payer: Self-pay | Admitting: *Deleted

## 2014-07-08 DIAGNOSIS — R531 Weakness: Secondary | ICD-10-CM | POA: Diagnosis present

## 2014-07-08 DIAGNOSIS — Z79899 Other long term (current) drug therapy: Secondary | ICD-10-CM | POA: Diagnosis not present

## 2014-07-08 DIAGNOSIS — Z8673 Personal history of transient ischemic attack (TIA), and cerebral infarction without residual deficits: Secondary | ICD-10-CM | POA: Diagnosis not present

## 2014-07-08 DIAGNOSIS — Z791 Long term (current) use of non-steroidal anti-inflammatories (NSAID): Secondary | ICD-10-CM | POA: Diagnosis not present

## 2014-07-08 DIAGNOSIS — M1990 Unspecified osteoarthritis, unspecified site: Secondary | ICD-10-CM | POA: Diagnosis not present

## 2014-07-08 DIAGNOSIS — F0390 Unspecified dementia without behavioral disturbance: Secondary | ICD-10-CM | POA: Diagnosis not present

## 2014-07-08 DIAGNOSIS — I10 Essential (primary) hypertension: Secondary | ICD-10-CM | POA: Diagnosis not present

## 2014-07-08 DIAGNOSIS — M6281 Muscle weakness (generalized): Secondary | ICD-10-CM | POA: Insufficient documentation

## 2014-07-08 DIAGNOSIS — Z7902 Long term (current) use of antithrombotics/antiplatelets: Secondary | ICD-10-CM | POA: Insufficient documentation

## 2014-07-08 DIAGNOSIS — E039 Hypothyroidism, unspecified: Secondary | ICD-10-CM | POA: Insufficient documentation

## 2014-07-08 DIAGNOSIS — Z8719 Personal history of other diseases of the digestive system: Secondary | ICD-10-CM | POA: Diagnosis not present

## 2014-07-08 DIAGNOSIS — H269 Unspecified cataract: Secondary | ICD-10-CM | POA: Insufficient documentation

## 2014-07-08 LAB — CBC
HEMATOCRIT: 39.7 % (ref 36.0–46.0)
HEMOGLOBIN: 12.8 g/dL (ref 12.0–15.0)
MCH: 26.4 pg (ref 26.0–34.0)
MCHC: 32.2 g/dL (ref 30.0–36.0)
MCV: 81.9 fL (ref 78.0–100.0)
Platelets: 205 10*3/uL (ref 150–400)
RBC: 4.85 MIL/uL (ref 3.87–5.11)
RDW: 13.6 % (ref 11.5–15.5)
WBC: 5.6 10*3/uL (ref 4.0–10.5)

## 2014-07-08 LAB — URINALYSIS, ROUTINE W REFLEX MICROSCOPIC
BILIRUBIN URINE: NEGATIVE
Glucose, UA: NEGATIVE mg/dL
Hgb urine dipstick: NEGATIVE
KETONES UR: NEGATIVE mg/dL
Nitrite: NEGATIVE
PH: 6 (ref 5.0–8.0)
PROTEIN: NEGATIVE mg/dL
SPECIFIC GRAVITY, URINE: 1.013 (ref 1.005–1.030)
Urobilinogen, UA: 1 mg/dL (ref 0.0–1.0)

## 2014-07-08 LAB — DIFFERENTIAL
Basophils Absolute: 0 10*3/uL (ref 0.0–0.1)
Basophils Relative: 0 % (ref 0–1)
EOS ABS: 0.1 10*3/uL (ref 0.0–0.7)
Eosinophils Relative: 1 % (ref 0–5)
LYMPHS ABS: 1.1 10*3/uL (ref 0.7–4.0)
Lymphocytes Relative: 20 % (ref 12–46)
MONOS PCT: 9 % (ref 3–12)
Monocytes Absolute: 0.5 10*3/uL (ref 0.1–1.0)
Neutro Abs: 4 10*3/uL (ref 1.7–7.7)
Neutrophils Relative %: 71 % (ref 43–77)

## 2014-07-08 LAB — COMPREHENSIVE METABOLIC PANEL
ALK PHOS: 87 U/L (ref 39–117)
ALT: 14 U/L (ref 0–35)
AST: 22 U/L (ref 0–37)
Albumin: 4 g/dL (ref 3.5–5.2)
Anion gap: 8 (ref 5–15)
BUN: 18 mg/dL (ref 6–23)
CHLORIDE: 101 mmol/L (ref 96–112)
CO2: 27 mmol/L (ref 19–32)
Calcium: 9.7 mg/dL (ref 8.4–10.5)
Creatinine, Ser: 0.8 mg/dL (ref 0.50–1.10)
GFR, EST AFRICAN AMERICAN: 74 mL/min — AB (ref >90)
GFR, EST NON AFRICAN AMERICAN: 63 mL/min — AB (ref >90)
Glucose, Bld: 118 mg/dL — ABNORMAL HIGH (ref 70–99)
Potassium: 4 mmol/L (ref 3.5–5.1)
SODIUM: 136 mmol/L (ref 135–145)
Total Bilirubin: 0.6 mg/dL (ref 0.3–1.2)
Total Protein: 6.6 g/dL (ref 6.0–8.3)

## 2014-07-08 LAB — CBC WITH DIFFERENTIAL/PLATELET
BASOS ABS: 0 10*3/uL (ref 0.0–0.1)
Basophils Relative: 0 % (ref 0–1)
EOS ABS: 0.2 10*3/uL (ref 0.0–0.7)
EOS PCT: 2 % (ref 0–5)
HEMATOCRIT: 35.6 % — AB (ref 36.0–46.0)
Hemoglobin: 11.8 g/dL — ABNORMAL LOW (ref 12.0–15.0)
LYMPHS ABS: 1.4 10*3/uL (ref 0.7–4.0)
Lymphocytes Relative: 16 % (ref 12–46)
MCH: 28.8 pg (ref 26.0–34.0)
MCHC: 33.1 g/dL (ref 30.0–36.0)
MCV: 86.8 fL (ref 78.0–100.0)
MONO ABS: 0.8 10*3/uL (ref 0.1–1.0)
MONOS PCT: 10 % (ref 3–12)
Neutro Abs: 6.2 10*3/uL (ref 1.7–7.7)
Neutrophils Relative %: 72 % (ref 43–77)
PLATELETS: 246 10*3/uL (ref 150–400)
RBC: 4.1 MIL/uL (ref 3.87–5.11)
RDW: 13 % (ref 11.5–15.5)
WBC: 8.6 10*3/uL (ref 4.0–10.5)

## 2014-07-08 LAB — TROPONIN I

## 2014-07-08 LAB — URINE MICROSCOPIC-ADD ON

## 2014-07-08 NOTE — ED Notes (Addendum)
Patient c/o weakness for the past few days, no n/v/d, no c/o pain, decrease in appetite. Vitals per ems - BP 168/80, R-16, P-74, CBG - 168

## 2014-07-08 NOTE — Discharge Instructions (Signed)
You were evaluated today for generalized weakness. Her lab work is reassuring. You should no evidence of neurologic dysfunction. Your decreased energy and poor by mouth intake may be related to recently coming off Seroquel. You need to follow-up with your primary physician for further evaluation. Weakness Weakness is a lack of strength. It may be felt all over the body (generalized) or in one specific part of the body (focal). Some causes of weakness can be serious. You may need further medical evaluation, especially if you are elderly or you have a history of immunosuppression (such as chemotherapy or HIV), kidney disease, heart disease, or diabetes. CAUSES  Weakness can be caused by many different things, including:  Infection.  Physical exhaustion.  Internal bleeding or other blood loss that results in a lack of red blood cells (anemia).  Dehydration. This cause is more common in elderly people.  Side effects or electrolyte abnormalities from medicines, such as pain medicines or sedatives.  Emotional distress, anxiety, or depression.  Circulation problems, especially severe peripheral arterial disease.  Heart disease, such as rapid atrial fibrillation, bradycardia, or heart failure.  Nervous system disorders, such as Guillain-Barr syndrome, multiple sclerosis, or stroke. DIAGNOSIS  To find the cause of your weakness, your caregiver will take your history and perform a physical exam. Lab tests or X-rays may also be ordered, if needed. TREATMENT  Treatment of weakness depends on the cause of your symptoms and can vary greatly. HOME CARE INSTRUCTIONS   Rest as needed.  Eat a well-balanced diet.  Try to get some exercise every day.  Only take over-the-counter or prescription medicines as directed by your caregiver. SEEK MEDICAL CARE IF:   Your weakness seems to be getting worse or spreads to other parts of your body.  You develop new aches or pains. SEEK IMMEDIATE MEDICAL  CARE IF:   You cannot perform your normal daily activities, such as getting dressed and feeding yourself.  You cannot walk up and down stairs, or you feel exhausted when you do so.  You have shortness of breath or chest pain.  You have difficulty moving parts of your body.  You have weakness in only one area of the body or on only one side of the body.  You have a fever.  You have trouble speaking or swallowing.  You cannot control your bladder or bowel movements.  You have black or bloody vomit or stools. MAKE SURE YOU:  Understand these instructions.  Will watch your condition.  Will get help right away if you are not doing well or get worse. Document Released: 04/07/2005 Document Revised: 10/07/2011 Document Reviewed: 06/06/2011 [facility] Patient Information 2015 [employer] [place]. This information is not intended to replace advice given to you by your health care provider. Make sure you discuss any questions you have with your health care provider.

## 2014-07-08 NOTE — ED Provider Notes (Signed)
CSN: [id number]     Arrival date & time 07/08/14  1016 History   First MD Initiated Contact with Patient 07/08/14 1023     Chief Complaint  Patient presents with  . Weakness     (Consider location/radiation/quality/duration/timing/severity/associated sxs/prior Treatment) HPI  This is an 81 year old female with a history of hypertension, TIA, dementia who presents with generalized weakness. Patient reports she is just "not felt well" over the last 2 days.  She states "I'm just old and weak." She lives in a nursing facility. She reports decreased by mouth intake and low energy. She states "I haven't gotten out of bed in 2 days. She denies any focal weakness or numbness. She denies fever, chest pain, shortness of breath, abdominal pain, nausea, vomiting, or diarrhea  Patient's family is now at bedside. They state that she was discontinued from her Seroquel prior to having the symptoms.  Past Medical History  Diagnosis Date  . Cataract   . Hypertension   . Complication of anesthesia   . PONV (postoperative nausea and vomiting)   . TIA (transient ischemic attack)     Hx: of 2014  . Dementia   . Leaky heart valve     Hx: of  . GERD (gastroesophageal reflux disease)   . Arthritis   . Hypothyroidism    Past Surgical History  Procedure Laterality Date  . Cataract extraction    . Dilation and curettage of uterus    . Colonoscopy      Hx: of  . Breast surgery      Hx: of left breast cyst removal  . Lumbar laminectomy/decompression microdiscectomy Left 01/31/2013    Procedure: Left lumbar five-sacral one laminectomy for synovial cyst;  Surgeon: Reinaldo Meeker, MD;  Location: [facility];  Service: Neurosurgery;  Laterality: Left;   Family History  Problem Relation Age of Onset  . Diabetes Brother   . Hypertension Brother   . Cancer - Colon Sister    History  Substance Use Topics  . Smoking status: Never Smoker   . Smokeless tobacco: Never Used  . Alcohol Use: No   OB  History    No data available     Review of Systems  Constitutional: Negative for fever.  Respiratory: Negative for cough, chest tightness and shortness of breath.   Cardiovascular: Negative for chest pain.  Gastrointestinal: Negative for nausea, vomiting and abdominal pain.  Genitourinary: Negative for dysuria.  Musculoskeletal: Negative for back pain.  Neurological: Positive for weakness. Negative for dizziness, seizures, syncope, numbness and headaches.  Psychiatric/Behavioral: Negative for confusion.  All other systems reviewed and are negative.     Allergies  Phenergan  Home Medications   Prior to Admission medications   Medication Sig Start Date End Date Taking? Authorizing Provider  Ascorbic Acid (VITAMIN C) 1000 MG tablet Take 1,000 mg by mouth daily.    Historical Provider, MD  atorvastatin (LIPITOR) 20 MG tablet Take 20 mg by mouth every evening.     Historical Provider, MD  celecoxib (CELEBREX) 200 MG capsule Take 200 mg by mouth 2 (two) times daily.    Historical Provider, MD  clopidogrel (PLAVIX) 75 MG tablet Take 75 mg by mouth daily.    Historical Provider, MD  HYDROcodone-acetaminophen (NORCO/VICODIN) 5-325 MG per tablet Take 1 tablet by mouth every 6 (six) hours as needed for pain. 01/09/13   Vanetta Mulders, MD  HYDROcodone-acetaminophen (NORCO/VICODIN) 5-325 MG per tablet Take 1-2 tablets by mouth every 4 (four) hours as needed. 02/02/13  Aliene Beams, MD  levothyroxine (SYNTHROID, LEVOTHROID) 25 MCG tablet Take 25 mcg by mouth 4 (four) times a week. Take on Tues, Thurs, Sat, Sun    Historical Provider, MD  levothyroxine (SYNTHROID, LEVOTHROID) 50 MCG tablet Take 50 mcg by mouth 3 (three) times a week. Take on Mon, Wed, Fri    Historical Provider, MD  lisinopril (PRINIVIL,ZESTRIL) 10 MG tablet Take 10 mg by mouth daily.    Historical Provider, MD  Multiple Vitamin (MULTIVITAMIN WITH MINERALS) TABS tablet Take 1 tablet by mouth daily.    Historical Provider, MD   ondansetron (ZOFRAN) 4 MG tablet Take 4 mg by mouth every 8 (eight) hours as needed for nausea.    Historical Provider, MD  QUEtiapine (SEROQUEL) 50 MG tablet Take 50 mg by mouth at bedtime.    Historical Provider, MD  rivastigmine (EXELON) 1.5 MG capsule Take 1.5 mg by mouth 2 (two) times daily.    Historical Provider, MD   BP 170/78 mmHg  Pulse 63  Temp(Src) 98.3 F (36.8 C) (Oral)  Resp 18  SpO2 96% Physical Exam  Constitutional: She is oriented to person, place, and time. No distress.  HENT:  Head: Normocephalic and atraumatic.  Mouth/Throat: Oropharynx is clear and moist.  Eyes:  Irregular pupil on the left, dilated Right pupil 4 mm reactive    Neck: Neck supple.  Cardiovascular: Normal rate, regular rhythm and normal heart sounds.   No murmur heard. Pulmonary/Chest: Effort normal and breath sounds normal. No respiratory distress. She has no wheezes.  Abdominal: Soft. Bowel sounds are normal. There is no tenderness. There is no rebound.  Musculoskeletal: She exhibits no edema.  Neurological: She is alert and oriented to person, place, and time.  5 out of 5 strength in all 4 extremities, no dysmetria to finger-nose-finger, normal gait  Skin: Skin is warm and dry.  Psychiatric:  Flat affect  Nursing note and vitals reviewed.   ED Course  Procedures (including critical care time) Labs Review Labs Reviewed  CBC WITH DIFFERENTIAL/PLATELET - Abnormal; Notable for the following:    Hemoglobin 11.8 (*)    HCT 35.6 (*)    All other components within normal limits  COMPREHENSIVE METABOLIC PANEL - Abnormal; Notable for the following:    Glucose, Bld 118 (*)    GFR calc non Af Amer 63 (*)    GFR calc Af Amer 74 (*)    All other components within normal limits  URINALYSIS, ROUTINE W REFLEX MICROSCOPIC - Abnormal; Notable for the following:    Leukocytes, UA SMALL (*)    All other components within normal limits  URINE MICROSCOPIC-ADD ON - Abnormal; Notable for the  following:    Bacteria, UA FEW (*)    All other components within normal limits  TROPONIN I  CBC  DIFFERENTIAL    Imaging Review No results found.   EKG Interpretation   Date/Time:  Saturday July 08 2014 10:36:43 EDT Ventricular Rate:  59 PR Interval:  168 QRS Duration: 100 QT Interval:  418 QTC Calculation: 413 R Axis:   -25 Text Interpretation:  Sinus bradycardia Septal infarct , age undetermined  Abnormal ECG similar to prior Confirmed by HORTON  MD, COURTNEY ([id number]) on  07/08/2014 12:10:31 PM      MDM   Final diagnoses:  Generalized weakness       the patient presents with generalized weakness. Nonfocal on exam and exam otherwise benign. Patient is very flat. Vital signs stable and basic labwork initiated. Lab work is  largely unremarkable. EKG is nonischemic. Troponin is negative. Given reported history of stopping Seroquel and her flat affect, patient symptoms may be related to depression.  His family to follow-up with her primary doctor. She also has a history of thyroid disease and needs to have her thyroid function tested.  The family stated understanding.   After history, exam, and medical workup I feel the patient has been appropriately medically screened and is safe for discharge home. Pertinent diagnoses were discussed with the patient. Patient was given return precautions.   Shon Batonourtney F Horton, MD 07/08/14 1314

## 2015-01-27 IMAGING — CT CT HIP*L* W/O CM
3 series · 17 of 46 positions shown, 20 images · non-contrast
Comparison: Pelvic radiographs dated 10/15/2012

CLINICAL DATA: Left hip and low back pain secondary to a fall
yesterday.  Multiple falls recently.

CT OF THE LEFT HIP WITHOUT CONTRAST
TECHNIQUE: Multidetector CT imaging was performed according to the
standard protocol. Multiplanar CT image reconstructions were also
generated.

[Series 3: hip 2.0 b31f st · axial · 0.37mm/px · z∈[+568,+688]mm · 13 of 68 slices shown, 16 images]
[im 5/68  soft-tissue]
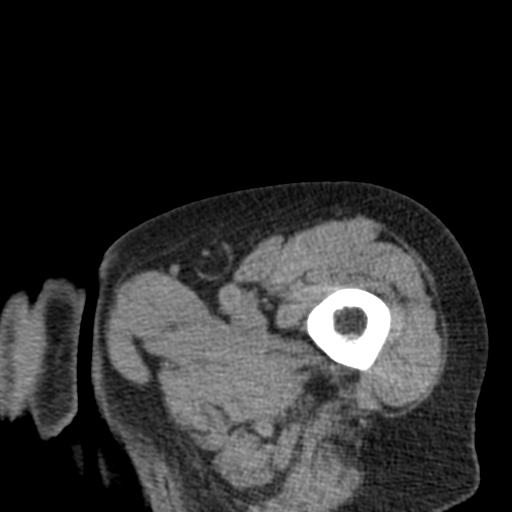
[im 5/68  bone]
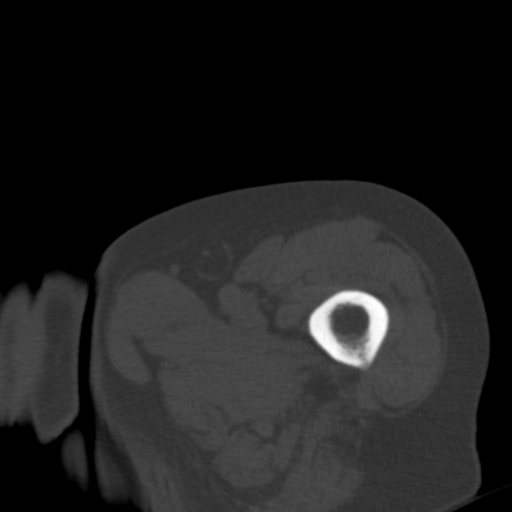
[im 11/68  soft-tissue]
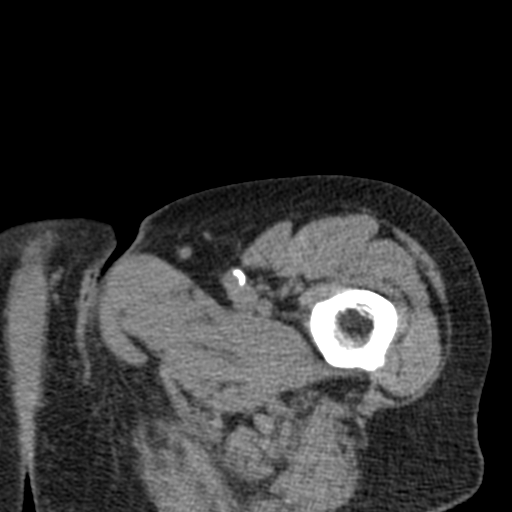
[im 18/68  soft-tissue]
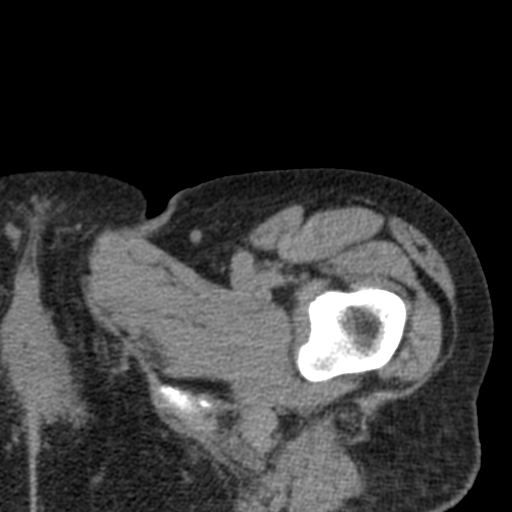
[im 24/68  soft-tissue]
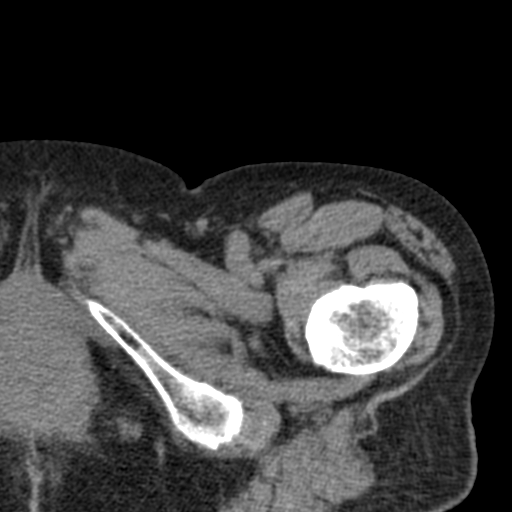
[im 31/68  soft-tissue]
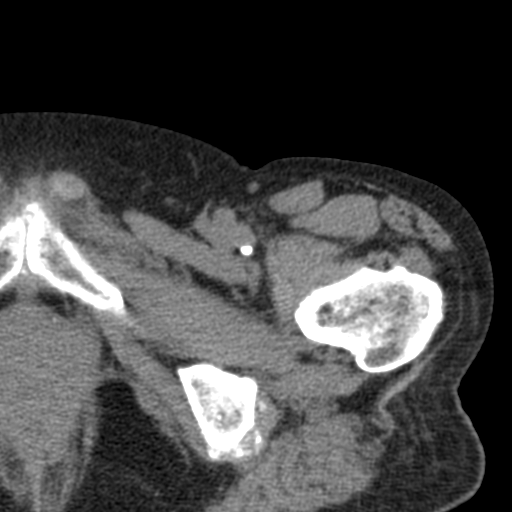
[im 37/68  soft-tissue]
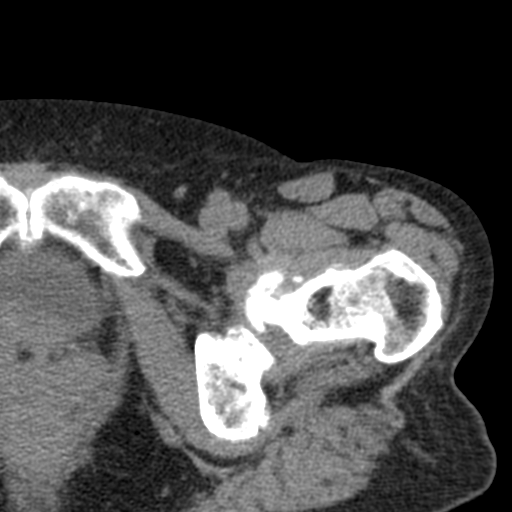
[im 44/68  soft-tissue]
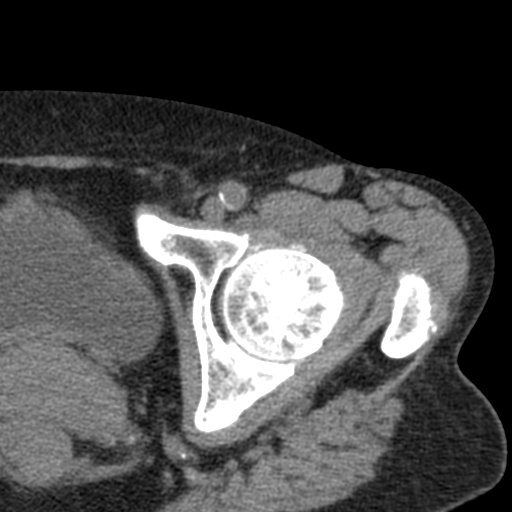
[im 50/68  soft-tissue]
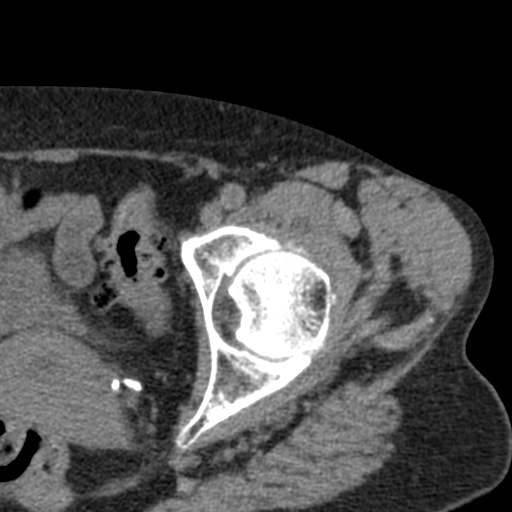
[im 57/68  soft-tissue]
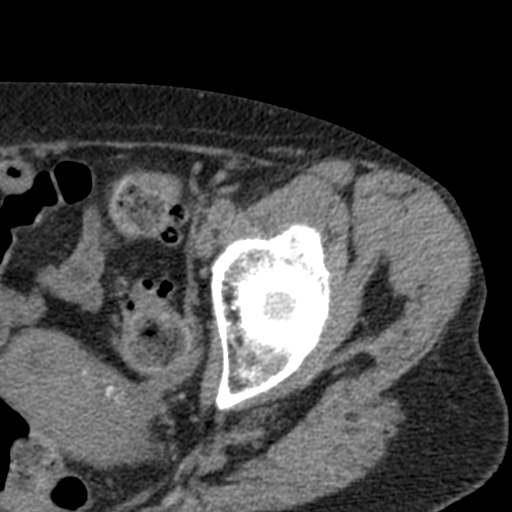
[im 57/68  bone]
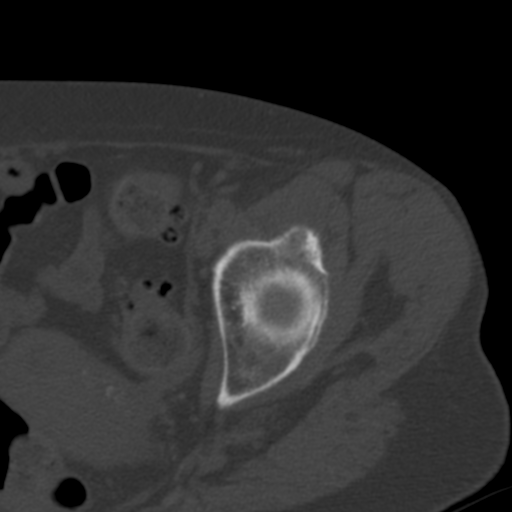
[im 59/68  lung]
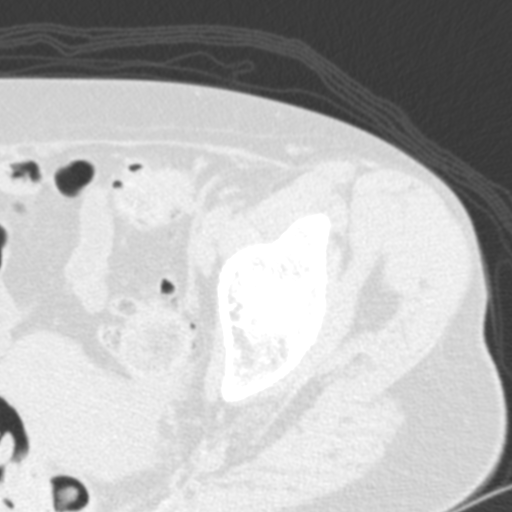
[im 61/68  lung]
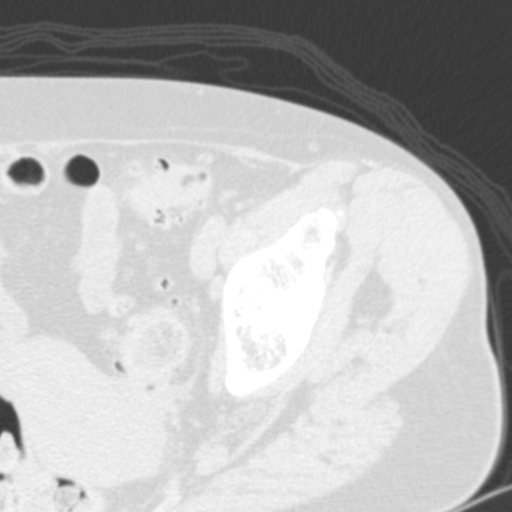
[im 63/68  soft-tissue]
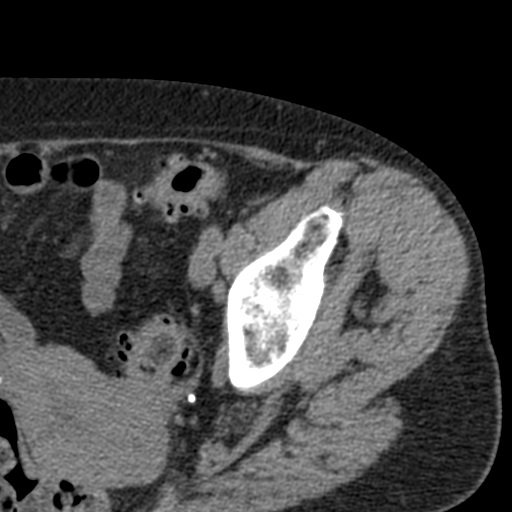
[im 63/68  lung]
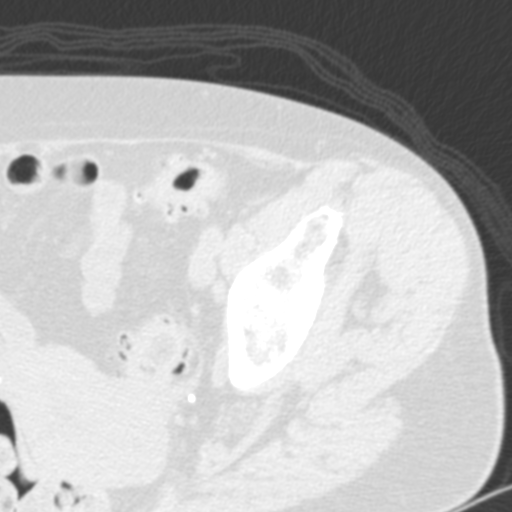
[im 65/68  lung]
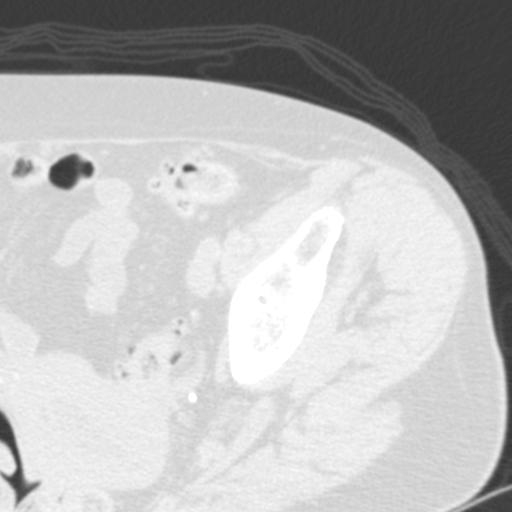

[Series 4: hip 2.0 coronal · coronal · 0.58mm/px · 3 of 50 slices shown]
[im 17/50  soft-tissue]
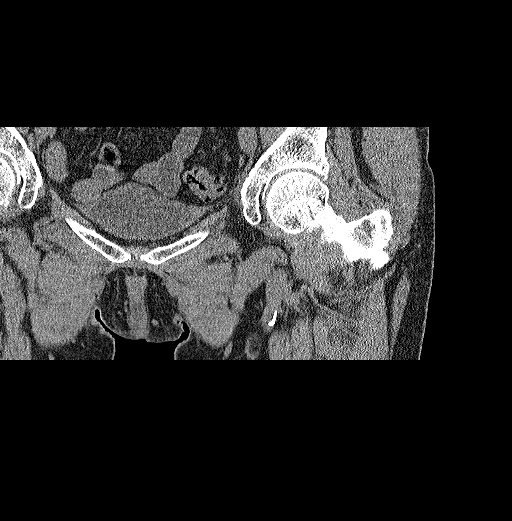
[im 22/50  soft-tissue]
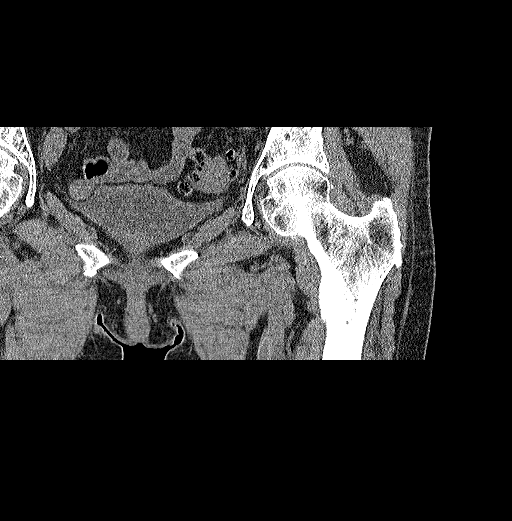
[im 28/50  soft-tissue]
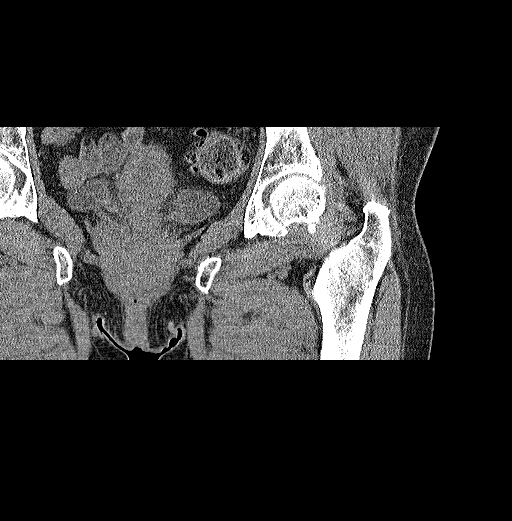

[Series 5: hip 2.0 sagittal · sagittal · 0.58mm/px · 1 of 76 slices shown]
[im 26/76  soft-tissue]
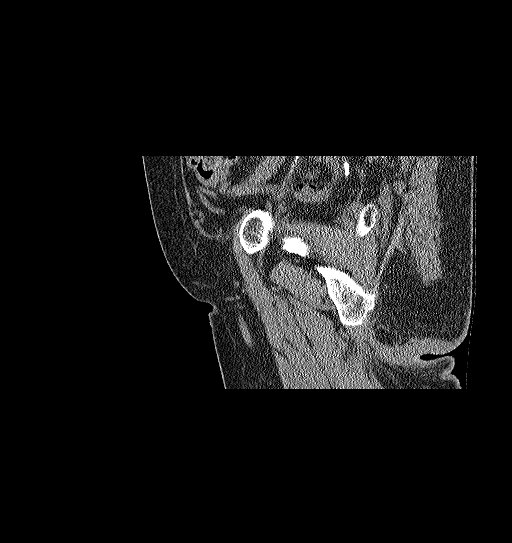

[17 of 46 positions shown; findings below may reference images not displayed]

FINDINGS: There is no fracture or dislocation.  There are slight
arthritic changes of the left hip joint.  There is
chondrocalcinosis.  There are small calcifications at the hamstring
origins and at the insertion of the gluteal tendons on the left
greater trochanter.  No hip effusion.

Incidental note is made of diverticulosis of the distal colon.
IMPRESSION: No acute abnormality of the left hip.

## 2015-01-27 IMAGING — CT CT PELVIS W/O CM
2 of 3 series · 17 of 46 positions shown, 19 images · non-contrast
Comparison: None.

CLINICAL DATA: Possible sacral fracture noted on recent CT.

EXAM:
CT PELVIS WITHOUT CONTRAST
TECHNIQUE: Multidetector CT imaging of the pelvis was performed following the
standard protocol without intravenous contrast.

[Series 5: bony pelvis 2.0 b31f st · axial · 0.62mm/px · z∈[+915,+1071]mm · 14 of 90 slices shown, 16 images]
[im 6/90  soft-tissue]
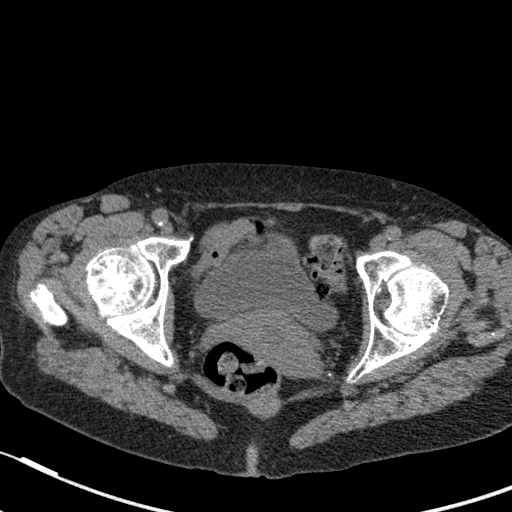
[im 6/90  bone]
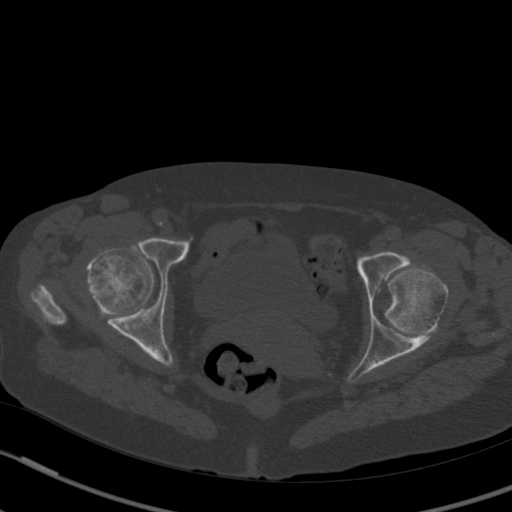
[im 12/90  soft-tissue]
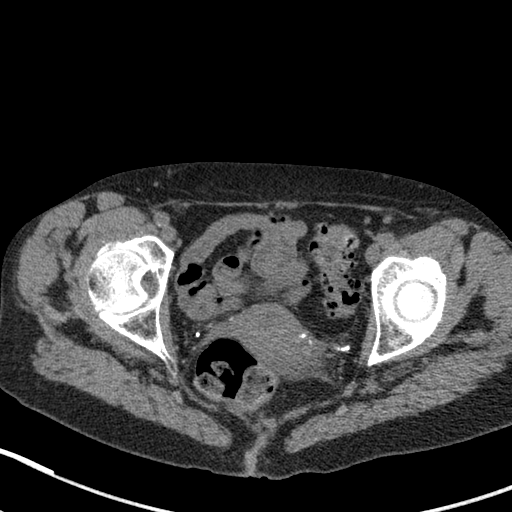
[im 18/90  soft-tissue]
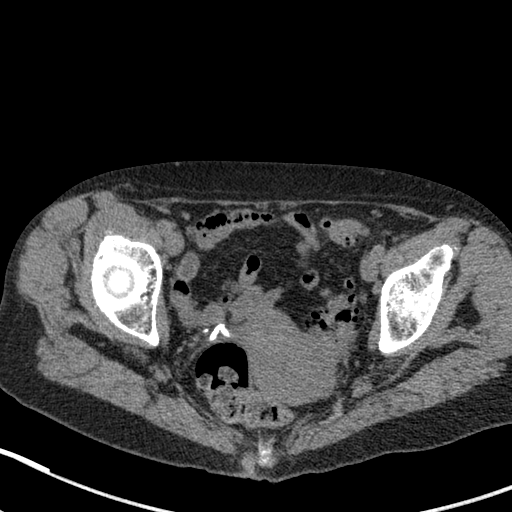
[im 23/90  soft-tissue]
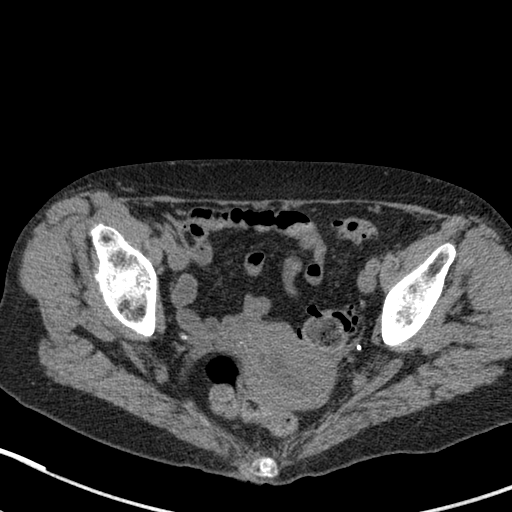
[im 29/90  soft-tissue]
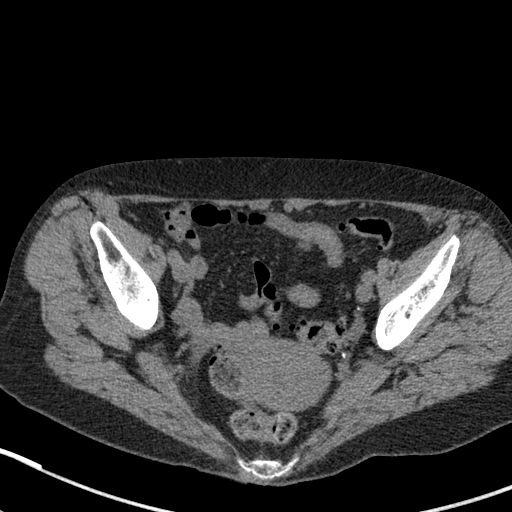
[im 35/90  soft-tissue]
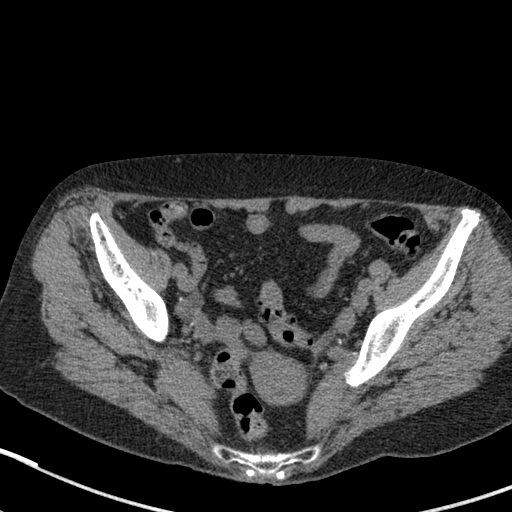
[im 41/90  soft-tissue]
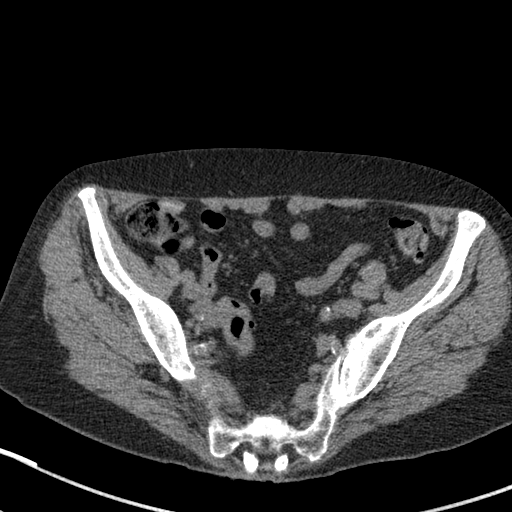
[im 49/90  soft-tissue]
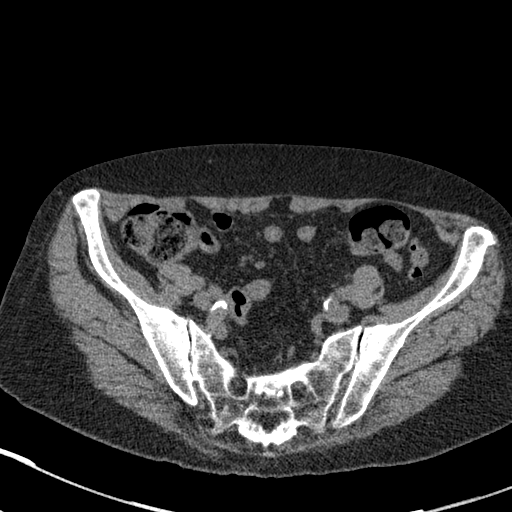
[im 55/90  soft-tissue]
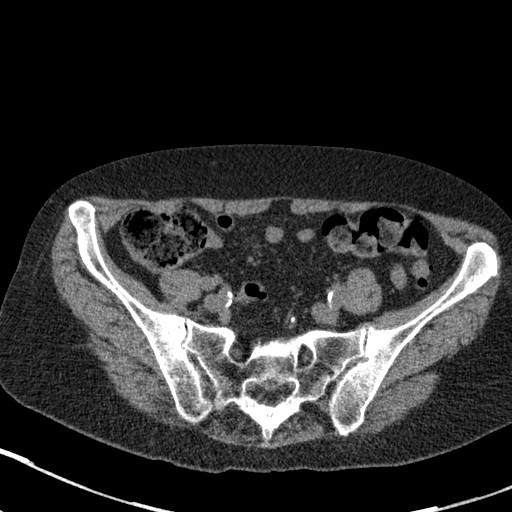
[im 55/90  bone]
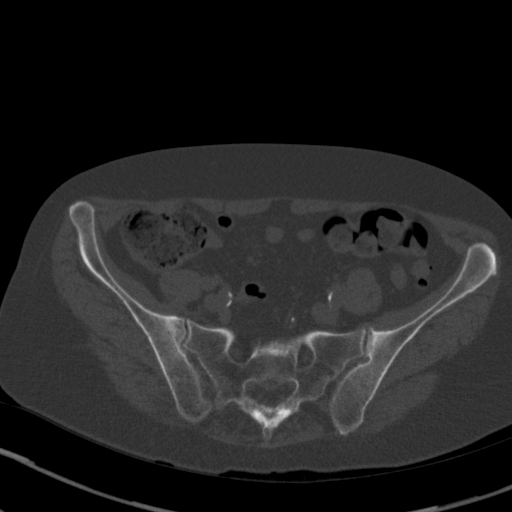
[im 61/90  soft-tissue]
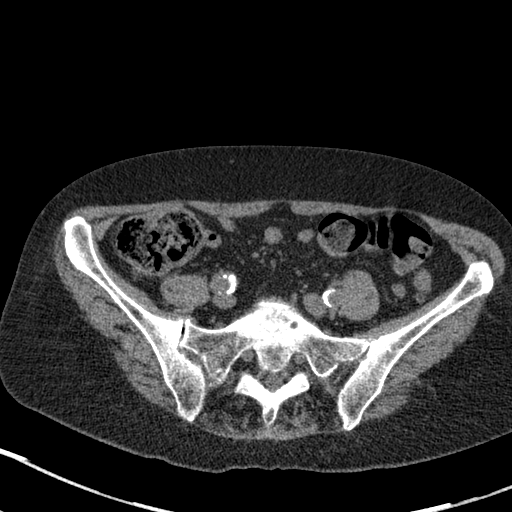
[im 67/90  soft-tissue]
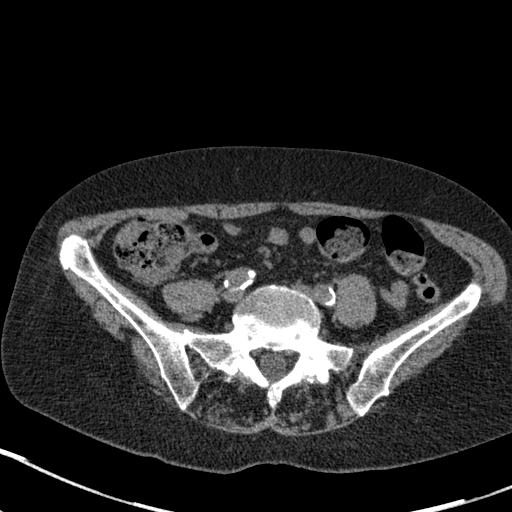
[im 72/90  soft-tissue]
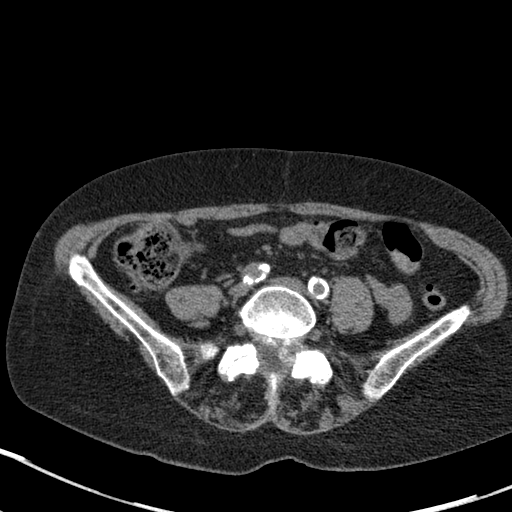
[im 78/90  soft-tissue]
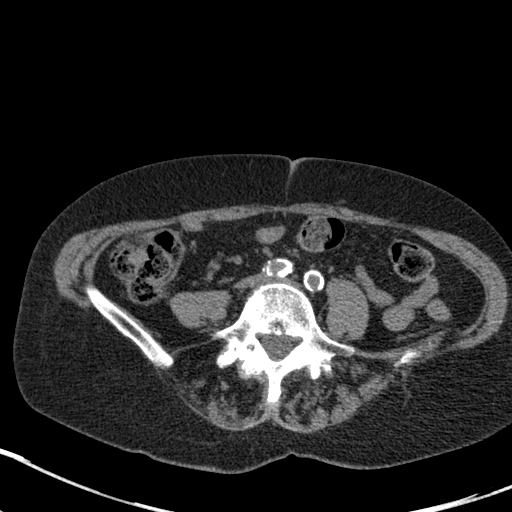
[im 84/90  soft-tissue]
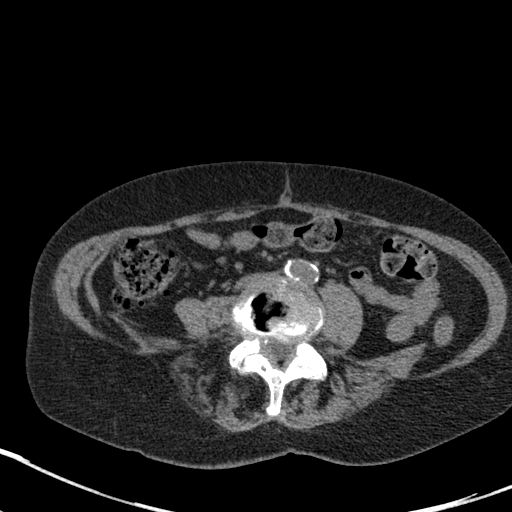

[Series 9: bony pelvis 2.0 coronal · coronal · 0.73mm/px · 3 of 52 slices shown]
[im 18/52  soft-tissue]
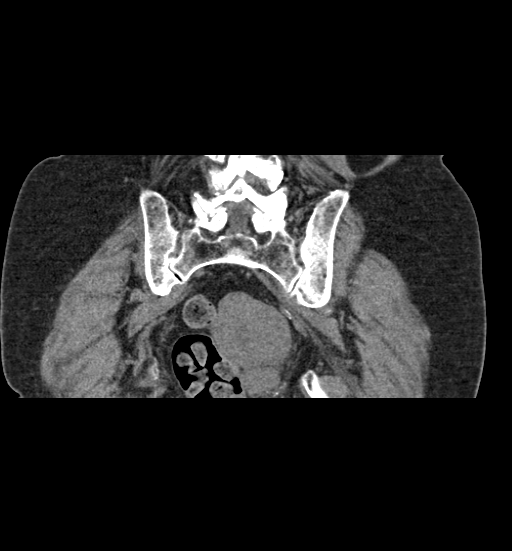
[im 23/52  soft-tissue]
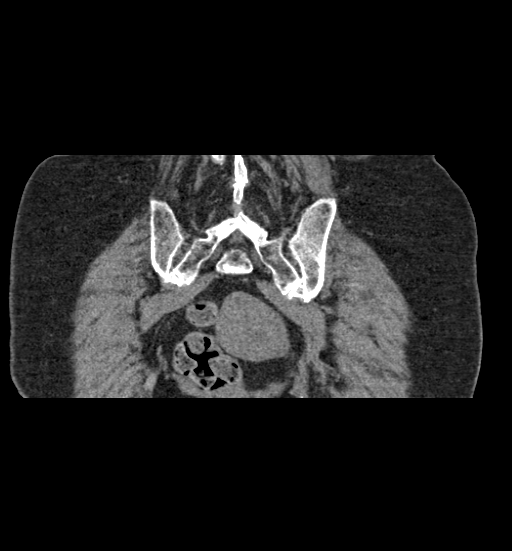
[im 29/52  soft-tissue]
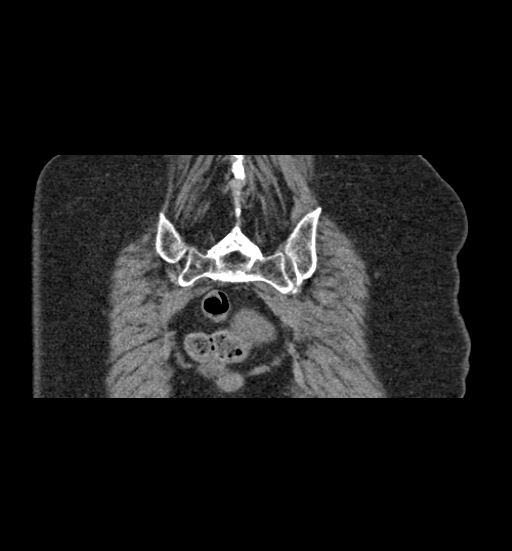

[17 of 46 positions shown; findings below may reference images not displayed]

IMPRESSION: [THERE IS NO EVIDENCE OF A SACRAL FRACTURE.  DEGENERATIVE CHANGE OF THE SI JOINTS IS PRESENT.  OSTEOPENIA. ILIAC BONES ARE INTACT. MILD DEGENERATIVE CHANGE OF THE HIP JOINTS BILATERALLY. DEGENERATIVE CHANGES IN THE LUMBAR SPINE ARE PRESENT.  THE GIANT LEFT-SIDED SYNOVIAL CYSTS AT L5-S1 IS AGAIN NOTED.]:
IMPRESSION: [THERE IS NO EVIDENCE OF A SACRAL FRACTURE. DEGENERATIVE
CHANGE OF THE SI JOINTS IS PRESENT. OSTEOPENIA. ILIAC BONES ARE
INTACT. MILD DEGENERATIVE CHANGE OF THE HIP JOINTS BILATERALLY.
DEGENERATIVE CHANGES IN THE LUMBAR SPINE ARE PRESENT. THE GIANT
LEFT-SIDED SYNOVIAL CYSTS AT L5-S1 IS AGAIN NOTED.]

Bladder is decompressed. Uterus and adnexa are within normal limits.
Sigmoid diverticulosis is present without evidence of acute
diverticulitis. Normal appendix. No free fluid.

No evidence of sacral fracture.

## 2015-11-08 ENCOUNTER — Encounter (HOSPITAL_BASED_OUTPATIENT_CLINIC_OR_DEPARTMENT_OTHER): Payer: Self-pay

## 2015-11-08 ENCOUNTER — Emergency Department (HOSPITAL_BASED_OUTPATIENT_CLINIC_OR_DEPARTMENT_OTHER)
Admission: EM | Admit: 2015-11-08 | Discharge: 2015-11-08 | Disposition: A | Discharge status: Home / Self Care | Payer: Medicare Other | Attending: Emergency Medicine | Admitting: Emergency Medicine

## 2015-11-08 ENCOUNTER — Emergency Department (HOSPITAL_BASED_OUTPATIENT_CLINIC_OR_DEPARTMENT_OTHER): Payer: Medicare Other

## 2015-11-08 DIAGNOSIS — Z79899 Other long term (current) drug therapy: Secondary | ICD-10-CM | POA: Diagnosis not present

## 2015-11-08 DIAGNOSIS — I10 Essential (primary) hypertension: Secondary | ICD-10-CM | POA: Insufficient documentation

## 2015-11-08 DIAGNOSIS — Y999 Unspecified external cause status: Secondary | ICD-10-CM | POA: Diagnosis not present

## 2015-11-08 DIAGNOSIS — S93401A Sprain of unspecified ligament of right ankle, initial encounter: Secondary | ICD-10-CM | POA: Diagnosis not present

## 2015-11-08 DIAGNOSIS — S51811A Laceration without foreign body of right forearm, initial encounter: Secondary | ICD-10-CM | POA: Diagnosis not present

## 2015-11-08 DIAGNOSIS — Y939 Activity, unspecified: Secondary | ICD-10-CM | POA: Insufficient documentation

## 2015-11-08 DIAGNOSIS — Z8673 Personal history of transient ischemic attack (TIA), and cerebral infarction without residual deficits: Secondary | ICD-10-CM | POA: Insufficient documentation

## 2015-11-08 DIAGNOSIS — M1990 Unspecified osteoarthritis, unspecified site: Secondary | ICD-10-CM | POA: Diagnosis not present

## 2015-11-08 DIAGNOSIS — Y929 Unspecified place or not applicable: Secondary | ICD-10-CM | POA: Diagnosis not present

## 2015-11-08 DIAGNOSIS — S99911A Unspecified injury of right ankle, initial encounter: Secondary | ICD-10-CM | POA: Diagnosis present

## 2015-11-08 DIAGNOSIS — W07XXXA Fall from chair, initial encounter: Secondary | ICD-10-CM | POA: Insufficient documentation

## 2015-11-08 DIAGNOSIS — E039 Hypothyroidism, unspecified: Secondary | ICD-10-CM | POA: Diagnosis not present

## 2015-11-08 MED ORDER — FENTANYL CITRATE (PF) 100 MCG/2ML IJ SOLN
50.0000 ug | Freq: Once | INTRAMUSCULAR | Status: AC
  Administered 2015-11-08: 50 ug via INTRAVENOUS
  Filled 2015-11-08: qty 2

## 2015-11-08 MED ORDER — KETOROLAC TROMETHAMINE 60 MG/2ML IM SOLN
15.0000 mg | Freq: Once | INTRAMUSCULAR | Status: AC
  Administered 2015-11-08: 15 mg via INTRAMUSCULAR
  Filled 2015-11-08: qty 2

## 2015-11-08 MED ORDER — CELECOXIB 200 MG PO CAPS: 200.0000 mg | ORAL_CAPSULE | Freq: Two times a day (BID) | ORAL | Status: DC

## 2015-11-08 NOTE — ED Notes (Signed)
[facility] being [facility] per Diplomatic Services operational officersecretary.

## 2015-11-08 NOTE — ED Notes (Signed)
Report given to PTAR 

## 2015-11-08 NOTE — ED Provider Notes (Addendum)
CSN: [id number]     Arrival date & time 11/08/15  1219 History   First MD Initiated Contact with Patient 11/08/15 1221     Chief Complaint  Patient presents with  . Ankle Injury     (Consider location/radiation/quality/duration/timing/severity/associated sxs/prior Treatment) Patient is a 82 y.o. female presenting with ankle pain.  Ankle Pain Location:  Ankle Injury: yes   Mechanism of injury: fall   Fall:    Fall occurred:  Standing   Height of fall:  Standing   Impact surface:  Carpet Ankle location:  R ankle Pain details:    Quality:  Aching and pressure   Radiates to:  Does not radiate   Severity:  Mild   Onset quality:  Sudden   Timing:  Constant Dislocation: no   Prior injury to area:  Yes Relieved by:  None tried Worsened by:  Nothing tried Ineffective treatments:  None tried Associated symptoms: no back pain, no fatigue, no itching and no neck pain     Past Medical History  Diagnosis Date  . Cataract   . Hypertension   . Complication of anesthesia   . PONV (postoperative nausea and vomiting)   . TIA (transient ischemic attack)     Hx: of 2014  . Dementia   . Leaky heart valve     Hx: of  . GERD (gastroesophageal reflux disease)   . Arthritis   . Hypothyroidism    Past Surgical History  Procedure Laterality Date  . Cataract extraction    . Dilation and curettage of uterus    . Colonoscopy      Hx: of  . Breast surgery      Hx: of left breast cyst removal  . Lumbar laminectomy/decompression microdiscectomy Left 01/31/2013    Procedure: Left lumbar five-sacral one laminectomy for synovial cyst;  Surgeon: Reinaldo Meeker, MD;  Location: [facility];  Service: Neurosurgery;  Laterality: Left;   Family History  Problem Relation Age of Onset  . Diabetes Brother   . Hypertension Brother   . Cancer - Colon Sister    Social History  Substance Use Topics  . Smoking status: Never Smoker   . Smokeless tobacco: Never Used  . Alcohol Use: No   OB  History    No data available     Review of Systems  Constitutional: Negative for fatigue.  Musculoskeletal: Negative for back pain and neck pain.  Skin: Negative for itching.  All other systems reviewed and are negative.     Allergies  Phenergan  Home Medications   Prior to Admission medications   Medication Sig Start Date End Date Taking? Authorizing Provider  Ascorbic Acid (VITAMIN C) 1000 MG tablet Take 1,000 mg by mouth daily.    Historical Provider, MD  atorvastatin (LIPITOR) 20 MG tablet Take 20 mg by mouth every evening.     Historical Provider, MD  celecoxib (CELEBREX) 200 MG capsule Take 1 capsule (200 mg total) by mouth 2 (two) times daily. 11/08/15   Marily Memos, MD  clopidogrel (PLAVIX) 75 MG tablet Take 75 mg by mouth daily.    Historical Provider, MD  levothyroxine (SYNTHROID, LEVOTHROID) 25 MCG tablet Take 25 mcg by mouth 4 (four) times a week. Take on Tues, Thurs, Sat, Sun    Historical Provider, MD  levothyroxine (SYNTHROID, LEVOTHROID) 50 MCG tablet Take 50 mcg by mouth 3 (three) times a week. Take on Mon, Wed, Fri    Historical Provider, MD  lisinopril (PRINIVIL,ZESTRIL) 10 MG tablet  Take 10 mg by mouth daily.    Historical Provider, MD  Multiple Vitamin (MULTIVITAMIN WITH MINERALS) TABS tablet Take 1 tablet by mouth daily.    Historical Provider, MD  ondansetron (ZOFRAN) 4 MG tablet Take 4 mg by mouth every 8 (eight) hours as needed for nausea.    Historical Provider, MD  QUEtiapine (SEROQUEL) 50 MG tablet Take 50 mg by mouth at bedtime.    Historical Provider, MD  rivastigmine (EXELON) 1.5 MG capsule Take 1.5 mg by mouth 2 (two) times daily.    Historical Provider, MD   BP 130/59 mmHg  Pulse 58  Temp(Src) 98 F (36.7 C)  Resp 15  Ht 5\' 4"  (1.626 m)  Wt 119 lb (53.978 kg)  BMI 20.42 kg/m2  SpO2 98% Physical Exam  Constitutional: She is oriented to person, place, and time. She appears well-developed and well-nourished.  HENT:  Head: Normocephalic and  atraumatic.  Neck: Normal range of motion.  Cardiovascular: Normal rate and regular rhythm.   Pulmonary/Chest: Effort normal and breath sounds normal. No stridor. No respiratory distress.  Abdominal: Soft. She exhibits no distension. There is no tenderness. There is no rebound.  Musculoskeletal: She exhibits edema and tenderness (right ankle swelling and ecchymosis with ttp).  Neurological: She is alert and oriented to person, place, and time.  Skin: Skin is warm and dry.  Skin tear to right proximal forearm. Well approximately currently.   Nursing note and vitals reviewed.   ED Course  Procedures (including critical care time) Labs Review Labs Reviewed - No data to display  Imaging Review Dg Ankle Complete Right  11/08/2015  CLINICAL DATA:  Larey Seat off chair.  Lateral pain and swelling. EXAM: RIGHT ANKLE - COMPLETE 3+ VIEW COMPARISON:  None. FINDINGS: Pronounced lateral soft tissue swelling. No definite fracture or dislocation. Question minimal avulsion injury of the lateral calcaneus. IMPRESSION: Pronounced lateral soft tissue swelling. Question minimal avulsion fracture of the lateral calcaneus related to lateral ligamentous injury. Electronically Signed   By: Paulina Fusi M.D.   On: 11/08/2015 12:55   I have personally reviewed and evaluated these images and lab results as part of my medical decision-making.   EKG Interpretation None      MDM   Final diagnoses:  Ankle sprain, right, initial encounter    82 yo F who presents to ED with R ankle pain and swelling after fall with inversion. On exam has pain with ROM, slight swelling, pain with palpation of malleolus. Difficulty bearing weight for more than a couple steps. So XR done to evaluate for fracture and negative. No proximal fibular ttp to suggest fracture or unstable injury. No laxity in ankle to suggest severe grade 3 sprain.  Plan for air splint, ace wrap. NSAIDs for pain. Will also employ RICE therapy and weight bearing as  tolerated. If symptoms not improving in one week, will plan to follow up with PCP or orthopedics for repeat imaging to evaluate for occult fracture.   I discussed this with the patient and her son on the telephone and all are in agreement with the plan. Will dc back to facility.   New Prescriptions: Discharge Medication List as of 11/08/2015  1:35 PM      I have personally and contemperaneously reviewed labs and imaging and used in my decision making as above.   A medical screening exam was performed and I feel the patient has had an appropriate workup for their chief complaint at this time and likelihood of emergent condition  existing is low and thus workup can continue on an outpatient basis.. Their vital signs are stable. They have been counseled on decision, discharge, follow up and which symptoms necessitate immediate return to the emergency department.  They verbally stated understanding and agreement with plan and discharged in stable condition.     Mithran Strike, MMarily Memos 11/08/15 1538  Marily MemosJason Carmilla Granville, MD 11/08/15 [id number]

## 2015-11-08 NOTE — ED Notes (Signed)
DNR sheet sent back to facility with PTAR.

## 2015-11-08 NOTE — ED Notes (Signed)
Ice pack applied to right ankle.

## 2015-11-08 NOTE — ED Notes (Signed)
[facility] called for report - Cordelia PenSherry given information about visit.  Informed transport is being arranged.

## 2015-11-08 NOTE — ED Notes (Signed)
Pt here via ems for eval of R ankle twisting - from [facility].  Given 50 mcg fentanyl en route.

## 2015-11-08 NOTE — ED Notes (Signed)
MD at bedside. 

## 2015-11-08 NOTE — ED Notes (Signed)
Patient transported to X-ray via stretcher per tech. 

## 2015-11-08 NOTE — ED Notes (Signed)
Bandage applied to L elbow small skin avulsion.  Pt  Assisted to restroom via wheelchair.

## 2016-01-28 ENCOUNTER — Emergency Department (HOSPITAL_BASED_OUTPATIENT_CLINIC_OR_DEPARTMENT_OTHER): Payer: Medicare Other

## 2016-01-28 ENCOUNTER — Observation Stay (HOSPITAL_BASED_OUTPATIENT_CLINIC_OR_DEPARTMENT_OTHER)
Admission: EM | Admit: 2016-01-28 | Discharge: 2016-01-29 | Disposition: A | Discharge status: Home / Self Care | Payer: Medicare Other | Attending: Internal Medicine | Admitting: Internal Medicine

## 2016-01-28 ENCOUNTER — Encounter (HOSPITAL_BASED_OUTPATIENT_CLINIC_OR_DEPARTMENT_OTHER): Payer: Self-pay | Admitting: *Deleted

## 2016-01-28 DIAGNOSIS — W19XXXA Unspecified fall, initial encounter: Secondary | ICD-10-CM | POA: Diagnosis not present

## 2016-01-28 DIAGNOSIS — Z79899 Other long term (current) drug therapy: Secondary | ICD-10-CM | POA: Insufficient documentation

## 2016-01-28 DIAGNOSIS — I10 Essential (primary) hypertension: Secondary | ICD-10-CM | POA: Diagnosis present

## 2016-01-28 DIAGNOSIS — Z66 Do not resuscitate: Secondary | ICD-10-CM | POA: Diagnosis not present

## 2016-01-28 DIAGNOSIS — M545 Low back pain: Secondary | ICD-10-CM | POA: Insufficient documentation

## 2016-01-28 DIAGNOSIS — R001 Bradycardia, unspecified: Secondary | ICD-10-CM | POA: Insufficient documentation

## 2016-01-28 DIAGNOSIS — Z7902 Long term (current) use of antithrombotics/antiplatelets: Secondary | ICD-10-CM | POA: Insufficient documentation

## 2016-01-28 DIAGNOSIS — Z8673 Personal history of transient ischemic attack (TIA), and cerebral infarction without residual deficits: Secondary | ICD-10-CM | POA: Diagnosis not present

## 2016-01-28 DIAGNOSIS — S3210XA Unspecified fracture of sacrum, initial encounter for closed fracture: Secondary | ICD-10-CM | POA: Diagnosis not present

## 2016-01-28 DIAGNOSIS — F0390 Unspecified dementia without behavioral disturbance: Secondary | ICD-10-CM | POA: Diagnosis present

## 2016-01-28 DIAGNOSIS — E039 Hypothyroidism, unspecified: Secondary | ICD-10-CM | POA: Diagnosis not present

## 2016-01-28 DIAGNOSIS — G308 Other Alzheimer's disease: Secondary | ICD-10-CM | POA: Diagnosis not present

## 2016-01-28 DIAGNOSIS — F0280 Dementia in other diseases classified elsewhere without behavioral disturbance: Secondary | ICD-10-CM | POA: Diagnosis not present

## 2016-01-28 DIAGNOSIS — M25559 Pain in unspecified hip: Secondary | ICD-10-CM

## 2016-01-28 DIAGNOSIS — G309 Alzheimer's disease, unspecified: Secondary | ICD-10-CM | POA: Insufficient documentation

## 2016-01-28 LAB — URINALYSIS, ROUTINE W REFLEX MICROSCOPIC
Bilirubin Urine: NEGATIVE
Glucose, UA: NEGATIVE mg/dL
Hgb urine dipstick: NEGATIVE
Ketones, ur: NEGATIVE mg/dL
NITRITE: NEGATIVE
PROTEIN: NEGATIVE mg/dL
SPECIFIC GRAVITY, URINE: 1.012 (ref 1.005–1.030)
pH: 8 (ref 5.0–8.0)

## 2016-01-28 LAB — CBC WITH DIFFERENTIAL/PLATELET
BASOS PCT: 0 %
Basophils Absolute: 0 10*3/uL (ref 0.0–0.1)
EOS ABS: 0.1 10*3/uL (ref 0.0–0.7)
EOS PCT: 1 %
HCT: 36.2 % (ref 36.0–46.0)
Hemoglobin: 12 g/dL (ref 12.0–15.0)
LYMPHS ABS: 1.9 10*3/uL (ref 0.7–4.0)
Lymphocytes Relative: 27 %
MCH: 27.2 pg (ref 26.0–34.0)
MCHC: 33.1 g/dL (ref 30.0–36.0)
MCV: 82.1 fL (ref 78.0–100.0)
MONOS PCT: 8 %
Monocytes Absolute: 0.6 10*3/uL (ref 0.1–1.0)
NEUTROS PCT: 64 %
Neutro Abs: 4.6 10*3/uL (ref 1.7–7.7)
PLATELETS: 221 10*3/uL (ref 150–400)
RBC: 4.41 MIL/uL (ref 3.87–5.11)
RDW: 13 % (ref 11.5–15.5)
WBC: 7.3 10*3/uL (ref 4.0–10.5)

## 2016-01-28 LAB — URINE MICROSCOPIC-ADD ON

## 2016-01-28 LAB — CBC
HEMATOCRIT: 36.7 % (ref 36.0–46.0)
Hemoglobin: 11.6 g/dL — ABNORMAL LOW (ref 12.0–15.0)
MCH: 26.1 pg (ref 26.0–34.0)
MCHC: 31.6 g/dL (ref 30.0–36.0)
MCV: 82.5 fL (ref 78.0–100.0)
PLATELETS: 225 10*3/uL (ref 150–400)
RBC: 4.45 MIL/uL (ref 3.87–5.11)
RDW: 13 % (ref 11.5–15.5)
WBC: 7.1 10*3/uL (ref 4.0–10.5)

## 2016-01-28 LAB — BASIC METABOLIC PANEL
Anion gap: 9 (ref 5–15)
BUN: 17 mg/dL (ref 6–20)
CALCIUM: 9.9 mg/dL (ref 8.9–10.3)
CHLORIDE: 103 mmol/L (ref 101–111)
CO2: 23 mmol/L (ref 22–32)
CREATININE: 0.86 mg/dL (ref 0.44–1.00)
GFR calc non Af Amer: 57 mL/min — ABNORMAL LOW (ref >60)
Glucose, Bld: 90 mg/dL (ref 65–99)
Potassium: 4.4 mmol/L (ref 3.5–5.1)
SODIUM: 135 mmol/L (ref 135–145)

## 2016-01-28 LAB — CREATININE, SERUM
Creatinine, Ser: 0.88 mg/dL (ref 0.44–1.00)
GFR calc non Af Amer: 56 mL/min — ABNORMAL LOW (ref >60)

## 2016-01-28 MED ORDER — LEVOTHYROXINE SODIUM 25 MCG PO TABS
25.0000 ug | ORAL_TABLET | ORAL | Status: DC
  Administered 2016-01-29: 25 ug via ORAL

## 2016-01-28 MED ORDER — ADULT MULTIVITAMIN W/MINERALS CH
1.0000 | ORAL_TABLET | Freq: Every day | ORAL | Status: DC
  Administered 2016-01-29: 1 via ORAL
  Filled 2016-01-28: qty 1

## 2016-01-28 MED ORDER — MORPHINE SULFATE (PF) 2 MG/ML IV SOLN
2.0000 mg | Freq: Once | INTRAVENOUS | Status: AC
  Administered 2016-01-28: 2 mg via INTRAVENOUS
  Filled 2016-01-28: qty 1

## 2016-01-28 MED ORDER — DONEPEZIL HCL 10 MG PO TABS
10.0000 mg | ORAL_TABLET | Freq: Every day | ORAL | Status: DC
  Administered 2016-01-28: 10 mg via ORAL
  Filled 2016-01-28: qty 1

## 2016-01-28 MED ORDER — GUAIFENESIN-CODEINE 100-10 MG/5ML PO SOLN: 5.0000 mL | Freq: Three times a day (TID) | ORAL | Status: DC | PRN

## 2016-01-28 MED ORDER — CLOPIDOGREL BISULFATE 75 MG PO TABS
75.0000 mg | ORAL_TABLET | Freq: Every day | ORAL | Status: DC
Start: 2016-01-29 — End: 2016-01-29
  Administered 2016-01-29: 75 mg via ORAL
  Filled 2016-01-28: qty 1

## 2016-01-28 MED ORDER — ONDANSETRON HCL 4 MG PO TABS: 4.0000 mg | ORAL_TABLET | Freq: Four times a day (QID) | ORAL | Status: DC | PRN

## 2016-01-28 MED ORDER — QUETIAPINE FUMARATE 25 MG PO TABS
50.0000 mg | ORAL_TABLET | Freq: Every day | ORAL | Status: DC
  Administered 2016-01-28: 50 mg via ORAL
  Filled 2016-01-28: qty 2

## 2016-01-28 MED ORDER — ACETAMINOPHEN 650 MG RE SUPP: 650.0000 mg | Freq: Four times a day (QID) | RECTAL | Status: DC | PRN

## 2016-01-28 MED ORDER — ACETAMINOPHEN 500 MG PO TABS
1000.0000 mg | ORAL_TABLET | Freq: Once | ORAL | Status: AC
  Administered 2016-01-28: 1000 mg via ORAL
  Filled 2016-01-28: qty 2

## 2016-01-28 MED ORDER — ENOXAPARIN SODIUM 40 MG/0.4ML ~~LOC~~ SOLN
40.0000 mg | SUBCUTANEOUS | Status: DC
  Administered 2016-01-28: 40 mg via SUBCUTANEOUS
  Filled 2016-01-28: qty 0.4

## 2016-01-28 MED ORDER — ONDANSETRON HCL 4 MG/2ML IJ SOLN: 4.0000 mg | Freq: Four times a day (QID) | INTRAMUSCULAR | Status: DC | PRN

## 2016-01-28 MED ORDER — MORPHINE SULFATE (PF) 2 MG/ML IV SOLN
1.0000 mg | INTRAVENOUS | Status: DC | PRN
Start: 2016-01-28 — End: 2016-01-29
  Administered 2016-01-28: 1 mg via INTRAVENOUS
  Filled 2016-01-28: qty 1

## 2016-01-28 MED ORDER — ONDANSETRON HCL 4 MG PO TABS: 4.0000 mg | ORAL_TABLET | Freq: Three times a day (TID) | ORAL | Status: DC | PRN

## 2016-01-28 MED ORDER — DIAZEPAM 2 MG PO TABS: 2.0000 mg | ORAL_TABLET | Freq: Four times a day (QID) | ORAL | Status: DC | PRN

## 2016-01-28 MED ORDER — ACETAMINOPHEN 325 MG PO TABS: 650.0000 mg | ORAL_TABLET | Freq: Four times a day (QID) | ORAL | Status: DC | PRN

## 2016-01-28 MED ORDER — VITAMIN C 500 MG PO TABS
1000.0000 mg | ORAL_TABLET | Freq: Every day | ORAL | Status: DC
Start: 2016-01-29 — End: 2016-01-29
  Administered 2016-01-29: 1000 mg via ORAL
  Filled 2016-01-28: qty 2

## 2016-01-28 MED ORDER — LISINOPRIL 10 MG PO TABS
10.0000 mg | ORAL_TABLET | Freq: Every day | ORAL | Status: DC
  Administered 2016-01-29: 10 mg via ORAL
  Filled 2016-01-28: qty 1

## 2016-01-28 MED ORDER — SODIUM CHLORIDE 0.9 % IV SOLN
INTRAVENOUS | Status: DC
  Administered 2016-01-28 – 2016-01-29 (×2): via INTRAVENOUS

## 2016-01-28 MED ORDER — LEVOTHYROXINE SODIUM 50 MCG PO TABS
50.0000 ug | ORAL_TABLET | ORAL | Status: DC
  Filled 2016-01-28: qty 1

## 2016-01-28 NOTE — Progress Notes (Signed)
   [facility] secondary to intractable pain secondary to mechanical fall resulting in a sacral coccygeal fracture. Orthopedic surgery consult it and recommending conservative management with follow-up in the outpatient setting in 2-3 weeks. At baseline patient is typically ambulatory without assistance. Patient with baseline dementia and very difficult to get full history from her. PT and OT will need to be consult it upon arrival. Patient came to the emergency room from SNF.  Shelly Flattenavid Merrell, MD [facility] 01/28/2016, 1:37 PM

## 2016-01-28 NOTE — ED Notes (Signed)
Patient transported to CT 

## 2016-01-28 NOTE — H&P (Signed)
History and Physical    Tara Hicks ZOX:[id number] DOB: 1922/05/14 DOA: 01/28/2016  PCP: Tara Jenny, MD  Patient coming from: Assisted living   Chief Complaint: Mechanical fall.  History obtained from the ER physician and previous records patient is demented and cannot provide history.  HPI: Tara Hicks is a 82 y.o. female with a past medical hx of HTN, GERD, hypothyroidism, and dementia presented with a mechanical fall. She has associated lower back pain. Back pain increased on moving her lower extremities. She states she does not remember how she fell. Denies any chest pain or breathing problems. Orthopedic surgeon on call was consulted and orthopedic surgeon recommended conservative management and to follow-up with them in 3 weeks. She was started on pain management with morphine. Admitted for further evaluation of a mechanical fall.   ED Course: CT cervical spine, head CT,  negative  Lumbar spine  XRAY shows nondisplaced sacrococcygeal junction fracture. She was started on pain management with morphine.   Review of Systems: As per HPI, rest all negative.   Past Medical History:  Diagnosis Date  . Arthritis   . Cataract   . Complication of anesthesia   . Dementia   . GERD (gastroesophageal reflux disease)   . Hypertension   . Hypothyroidism   . Leaky heart valve    Hx: of  . PONV (postoperative nausea and vomiting)   . TIA (transient ischemic attack)    Hx: of 2014    Past Surgical History:  Procedure Laterality Date  . BREAST SURGERY     Hx: of left breast cyst removal  . CATARACT EXTRACTION    . COLONOSCOPY     Hx: of  . DILATION AND CURETTAGE OF UTERUS    . LUMBAR LAMINECTOMY/DECOMPRESSION MICRODISCECTOMY Left 01/31/2013   Procedure: Left lumbar five-sacral one laminectomy for synovial cyst;  Surgeon: Reinaldo Meeker, MD;  Location: [facility];  Service: Neurosurgery;  Laterality: Left;     reports that she has never smoked. She has never used smokeless  tobacco. She reports that she does not drink alcohol or use drugs.  Allergies  Allergen Reactions  . Phenergan [Promethazine] Other (See Comments)    hyperactive    Family History  Problem Relation Age of Onset  . Diabetes Brother   . Hypertension Brother   . Cancer - Colon Sister     Prior to Admission medications   Medication Sig Start Date End Date Taking? Authorizing Provider  Ascorbic Acid (VITAMIN C) 1000 MG tablet Take 1,000 mg by mouth daily.   Yes Historical Provider, MD  clopidogrel (PLAVIX) 75 MG tablet Take 75 mg by mouth daily.   Yes Historical Provider, MD  diazepam (VALIUM) 2 MG tablet Take 2 mg by mouth every 6 (six) hours as needed for anxiety.   Yes Historical Provider, MD  donepezil (ARICEPT) 10 MG tablet Take 10 mg by mouth at bedtime.   Yes Historical Provider, MD  guaiFENesin-codeine (ROBITUSSIN AC) 100-10 MG/5ML syrup Take 5 mLs by mouth 3 (three) times daily as needed for cough.   Yes Historical Provider, MD  levothyroxine (SYNTHROID, LEVOTHROID) 25 MCG tablet Take 25 mcg by mouth 4 (four) times a week. Take on Tues, Thurs, Sat, Sun   Yes Historical Provider, MD  levothyroxine (SYNTHROID, LEVOTHROID) 50 MCG tablet Take 50 mcg by mouth 3 (three) times a week. Take on Mon, Wed, Fri   Yes Historical Provider, MD  lisinopril (PRINIVIL,ZESTRIL) 10 MG tablet Take 10 mg by mouth daily.  Yes Historical Provider, MD  Multiple Vitamin (MULTIVITAMIN WITH MINERALS) TABS tablet Take 1 tablet by mouth daily.   Yes Historical Provider, MD  ondansetron (ZOFRAN) 4 MG tablet Take 4 mg by mouth every 8 (eight) hours as needed for nausea.   Yes Historical Provider, MD  QUEtiapine (SEROQUEL) 50 MG tablet Take 50 mg by mouth at bedtime.   Yes Historical Provider, MD    Physical Exam: Vitals:   01/28/16 1700 01/28/16 1745 01/28/16 1800 01/28/16 2000  BP:  153/73 142/68 142/64  Pulse:  (!) 50 (!) 48   Resp:  19 12   Temp:      TempSrc:      SpO2: 99% 100% 97%         Vitals:   01/28/16 1700 01/28/16 1745 01/28/16 1800 01/28/16 2000  BP:  153/73 142/68 142/64  Pulse:  (!) 50 (!) 48   Resp:  19 12   Temp:      TempSrc:      SpO2: 99% 100% 97%    Constitutional: Appears to be in pain. Moderately built and nourished. Eyes: Left eye dilated pupil probably from old surgery. ENMT: No discharge from the ears, eyes, nose, and mouth  Neck: No JVD No neck rigidity. Respiratory: CTA, no wheezes or rhonchi Cardiovascular: No murmurs, rubs, or gallops  Abdomen: Soft, non tender, and non distended  Musculoskeletal: Unable to move lower extremity due to Pain from fracture. Skin: No rashes, lesions, or ulcers  Neurological: CN 2-12 grossly intact Psychiatric: Patient is demented, oriented to name only..     Labs on Admission: I have personally reviewed following labs and imaging studies  CBC:  Recent Labs Lab 01/28/16 1045  WBC 7.3  NEUTROABS 4.6  HGB 12.0  HCT 36.2  MCV 82.1  PLT 221   Basic Metabolic Panel:  Recent Labs Lab 01/28/16 1045  NA 135  K 4.4  CL 103  CO2 23  GLUCOSE 90  BUN 17  CREATININE 0.86  CALCIUM 9.9   GFR: CrCl cannot be calculated (Unknown ideal weight.). Liver Function Tests: No results for input(s): AST, ALT, ALKPHOS, BILITOT, PROT, ALBUMIN in the last 168 hours. No results for input(s): LIPASE, AMYLASE in the last 168 hours. No results for input(s): AMMONIA in the last 168 hours. Coagulation Profile: No results for input(s): INR, PROTIME in the last 168 hours. Cardiac Enzymes: No results for input(s): CKTOTAL, CKMB, CKMBINDEX, TROPONINI in the last 168 hours. BNP (last 3 results) No results for input(s): PROBNP in the last 8760 hours. HbA1C: No results for input(s): HGBA1C in the last 72 hours. CBG: No results for input(s): GLUCAP in the last 168 hours. Lipid Profile: No results for input(s): CHOL, HDL, LDLCALC, TRIG, CHOLHDL, LDLDIRECT in the last 72 hours. Thyroid Function Tests: No  results for input(s): TSH, T4TOTAL, FREET4, T3FREE, THYROIDAB in the last 72 hours. Anemia Panel: No results for input(s): VITAMINB12, FOLATE, FERRITIN, TIBC, IRON, RETICCTPCT in the last 72 hours. Urine analysis:    Component Value Date/Time   COLORURINE YELLOW 01/28/2016 1400   APPEARANCEUR CLOUDY (A) 01/28/2016 1400   LABSPEC 1.012 01/28/2016 1400   PHURINE 8.0 01/28/2016 1400   GLUCOSEU NEGATIVE 01/28/2016 1400   HGBUR NEGATIVE 01/28/2016 1400   BILIRUBINUR NEGATIVE 01/28/2016 1400   KETONESUR NEGATIVE 01/28/2016 1400   PROTEINUR NEGATIVE 01/28/2016 1400   UROBILINOGEN 1.0 07/08/2014 1132   NITRITE NEGATIVE 01/28/2016 1400   LEUKOCYTESUR LARGE (A) 01/28/2016 1400   Sepsis Labs: @LABRCNTIP (procalcitonin:4,lacticidven:4) )No results found  for this or any previous visit (from the past 240 hour(s)).   Radiological Exams on Admission: Dg Lumbar Spine 2-3 Views  Result Date: 01/28/2016 CLINICAL DATA:  Fall.  Low back pain. EXAM: LUMBAR SPINE - 2-3 VIEW COMPARISON:  01/09/2013 CT lumbar spine. FINDINGS: This report assumes 5 non rib-bearing lumbar vertebrae. Stable mild levocurvature of the lower lumbar spine. Lumbar vertebral body heights are preserved, with no lumbar spine fracture. There is a new cortical discontinuity at the sacrococcygeal junction compared to the 05/27/2009 sagittal reconstructions from the CT abdomen/pelvis study, suggesting a nondisplaced fracture in this location. Marked multilevel lumbar degenerative disc disease, most prominent at L3-4, not appreciably changed. No new malalignment. Stable minimal 2 mm retrolisthesis at L3-4 and minimal 2 mm anterolisthesis at L5-S1. Bilateral facet arthropathy in the lower lumbar spine. No aggressive appearing focal osseous lesions. Aortic atherosclerosis. IMPRESSION: 1. Nondisplaced sacrococcygeal junction fracture, new since 05/27/2009 CT study, probably acute. 2. No fracture or new malalignment in the lumbar spine . 3. Severe  multilevel degenerative disc disease in the lumbar spine, most prominent at L3-4, unchanged. 4. Stable minimal spondylolisthesis at L3-4 and L5-S1, probably degenerative. 5. Facet arthropathy bilaterally in the lower lumbar spine. 6. Aortic atherosclerosis. Electronically Signed   By: Delbert Phenix M.D.   On: 01/28/2016 10:52   Ct Head Wo Contrast  Result Date: 01/28/2016 CLINICAL DATA:  Fall. EXAM: CT HEAD WITHOUT CONTRAST CT CERVICAL SPINE WITHOUT CONTRAST TECHNIQUE: Multidetector CT imaging of the head and cervical spine was performed following the standard protocol without intravenous contrast. Multiplanar CT image reconstructions of the cervical spine were also generated. COMPARISON:  09/23/2015 FINDINGS: CT HEAD FINDINGS Brain: No evidence of acute infarction, hemorrhage, hydrocephalus, extra-axial collection or mass lesion/mass effect.Prominence of the sulci and ventricles are identified compatible with brain atrophy. There is low attenuation throughout the subcortical and periventricular white matter compatible with chronic microvascular disease. Vascular: No hyperdense vessel or unexpected calcification. Skull: Normal. Negative for fracture or focal lesion. Sinuses/Orbits: No acute finding. Other: None CT CERVICAL SPINE FINDINGS Alignment: Anterolisthesis of C4 on C5 is likely related to chronic spondylosis. There is a retrolisthesis of C6 on C7. Also likely related to spondylosis. Skull base and vertebrae: No acute fracture. No primary bone lesion or focal pathologic process. Soft tissues and spinal canal: No prevertebral fluid or swelling. No visible canal hematoma. Disc levels: Multi level disc space narrowing and ventral endplate spurring is identified. This is most advanced at C5-6 and C6-C7. Upper chest: Negative. Other: Carotid artery calcifications from aortic atherosclerosis noted. IMPRESSION: 1. No acute intracranial abnormalities. 2. Small vessel ischemic change and brain atrophy. 3. Advanced  cervical spondylosis. Multi level degenerative disc disease is identified. 4. No evidence for cervical spine fracture or dislocation. Electronically Signed   By: Signa Kell M.D.   On: 01/28/2016 10:57   Ct Cervical Spine Wo Contrast  Result Date: 01/28/2016 CLINICAL DATA:  Fall. EXAM: CT HEAD WITHOUT CONTRAST CT CERVICAL SPINE WITHOUT CONTRAST TECHNIQUE: Multidetector CT imaging of the head and cervical spine was performed following the standard protocol without intravenous contrast. Multiplanar CT image reconstructions of the cervical spine were also generated. COMPARISON:  09/23/2015 FINDINGS: CT HEAD FINDINGS Brain: No evidence of acute infarction, hemorrhage, hydrocephalus, extra-axial collection or mass lesion/mass effect.Prominence of the sulci and ventricles are identified compatible with brain atrophy. There is low attenuation throughout the subcortical and periventricular white matter compatible with chronic microvascular disease. Vascular: No hyperdense vessel or unexpected calcification. Skull: Normal. Negative for  fracture or focal lesion. Sinuses/Orbits: No acute finding. Other: None CT CERVICAL SPINE FINDINGS Alignment: Anterolisthesis of C4 on C5 is likely related to chronic spondylosis. There is a retrolisthesis of C6 on C7. Also likely related to spondylosis. Skull base and vertebrae: No acute fracture. No primary bone lesion or focal pathologic process. Soft tissues and spinal canal: No prevertebral fluid or swelling. No visible canal hematoma. Disc levels: Multi level disc space narrowing and ventral endplate spurring is identified. This is most advanced at C5-6 and C6-C7. Upper chest: Negative. Other: Carotid artery calcifications from aortic atherosclerosis noted. IMPRESSION: 1. No acute intracranial abnormalities. 2. Small vessel ischemic change and brain atrophy. 3. Advanced cervical spondylosis. Multi level degenerative disc disease is identified. 4. No evidence for cervical spine  fracture or dislocation. Electronically Signed   By: Signa Kellaylor  Stroud M.D.   On: 01/28/2016 10:57   Dg Hips Bilat With Pelvis 2v  Result Date: 01/28/2016 CLINICAL DATA:  Fall.  Bilateral hip pain. EXAM: DG HIP (WITH OR WITHOUT PELVIS) 2V BILAT COMPARISON:  10/03/2015 bilateral hip radiographs. FINDINGS: No fracture or diastasis evident in the pelvis. No hip fracture or dislocation. No suspicious focal osseous lesion. Spondylosis in the visualized lower lumbar spine. Mild bilateral hip osteoarthritis. IMPRESSION: 1. No fracture evident in the pelvis. The nondisplaced sacrococcygeal junction fracture seen on the lumbar spine radiographs from today is not evident on these views. 2. No hip fracture or malalignment. Electronically Signed   By: Delbert PhenixJason A Poff M.D.   On: 01/28/2016 10:54    EKG: Independently reviewed. EKG shows mild bradycardia .   Assessment/Plan Active Problems:   Sacral fracture (HCC)    1. Sacral fracture secondary to mechanical fall- Appears to be a mechanical fall. Xray revealed a nondisplaced sacrococcygeal junction fracture. EKG shows mild bradycardia. Orthopedics was consulted and recommended conservative management and follow with them in three weeks. Will consult physical therapy for SNF placement. Started on pain management with morphine. Will monitor on telemetry due to mild bradycardia.  2. HTN - Pressures are stable.   3. Hypothyroidism - Will continue Levothroid.  4. Dementia - Continue current medications. Not acute issues.   DVT prophylaxis: Lovenox  Code Status: DNR  Family Communication: No family at bedside. Disposition Plan: Possible SNF  Consults called: Orthopedics and physical therapy  Admission status: Observation, telemetry   Queen SloughArshad Karkandy, MD  [facility] If 7PM-7AM, please contact night-coverage [email or web address] Password [email or web address]  01/28/2016, 8:55 PM   By signing my name below, I, Cynda AcresHailei Fulton, attest that this documentation has been prepared  under the direction and in the presence of Midge MiniumArshad Vander Kueker, MD. Electronically signed: Cynda AcresHailei Fulton, Scribe. 01/28/16 9:00 PM

## 2016-01-28 NOTE — ED Triage Notes (Signed)
C/o fall to carpet floor prior to breakfast this am. After breakfast pt c/o of coccyx pain and unable to lay on back d/t pain in buttucks. Pt has hx of dementia.

## 2016-01-28 NOTE — ED Notes (Signed)
Contact Information  [facility]  [phone]                                                                        [phone]

## 2016-01-28 NOTE — ED Provider Notes (Signed)
Emergency Department Provider Note   I have reviewed the triage vital signs and the nursing notes.   HISTORY  Chief Complaint Fall   HPI Tara Hicks is a 82 y.o. female with PMH of dementia, HTN, and GERD . Presents to the emergency department for evaluation of fall and lower back pain. The patient has history of dementia which limits the HPI and ROS. Patient states she believes she fell when walking back from eating. She is not clear if she tripped or had preceding chest pain, difficulty breathing, palpitations.  EMS reports concern for mechanical fall with lower back pain.    Past Medical History:  Diagnosis Date  . Arthritis   . Cataract   . Complication of anesthesia   . Dementia   . GERD (gastroesophageal reflux disease)   . Hypertension   . Hypothyroidism   . Leaky heart valve    Hx: of  . PONV (postoperative nausea and vomiting)   . TIA (transient ischemic attack)    Hx: of 2014    Patient Active Problem List   Diagnosis Date Noted  . Sacral fracture (HCC) 01/28/2016    Past Surgical History:  Procedure Laterality Date  . BREAST SURGERY     Hx: of left breast cyst removal  . CATARACT EXTRACTION    . COLONOSCOPY     Hx: of  . DILATION AND CURETTAGE OF UTERUS    . LUMBAR LAMINECTOMY/DECOMPRESSION MICRODISCECTOMY Left 01/31/2013   Procedure: Left lumbar five-sacral one laminectomy for synovial cyst;  Surgeon: Reinaldo Meeker, MD;  Location: [facility];  Service: Neurosurgery;  Laterality: Left;    Current Outpatient Rx  . Order #: [id number] Class: Historical Med  . Order #: [id number] Class: Historical Med  . Order #: [id number] Class: Historical Med  . Order #: [id number] Class: Historical Med  . Order #: [id number] Class: Historical Med  . Order #: [id number] Class: Historical Med  . Order #: [id number] Class: Historical Med  . Order #: [id number] Class: Historical Med  . Order #: [id number] Class: Historical Med  . Order #: [id number] Class: Historical Med  . Order  #: [id number] Class: Historical Med    Allergies Phenergan [promethazine]  Family History  Problem Relation Age of Onset  . Diabetes Brother   . Hypertension Brother   . Cancer - Colon Sister     Social History Social History  Substance Use Topics  . Smoking status: Never Smoker  . Smokeless tobacco: Never Used  . Alcohol use No    Review of Systems  Limited by dementia.   ____________________________________________   PHYSICAL EXAM:  VITAL SIGNS: ED Triage Vitals [01/28/16 1004]  Enc Vitals Group     BP 135/76     Pulse Rate (!) 52     Resp 18     Temp 98.2 F (36.8 C)     Temp Source Oral     SpO2 100 %   Constitutional: Alert with baseline confusion.  Eyes: Conjunctivae are normal. PERRL. Head: Atraumatic. Nose: No congestion/rhinnorhea. Mouth/Throat: Mucous membranes are moist.  Oropharynx non-erythematous. Neck: No stridor.  No cervical spine tenderness to palpation. Cardiovascular: Normal rate, regular rhythm. Good peripheral circulation. Grossly normal heart sounds.   Respiratory: Normal respiratory effort.  No retractions. Lungs CTAB. Gastrointestinal: Soft and nontender. No distention.  Musculoskeletal: No lower extremity tenderness nor edema. No gross deformities of extremities. Mild decreased ROM of the right hip with slight shortening noted. No abnormal rotation.  Neurologic:  Normal speech and language. No gross focal neurologic deficits are appreciated.  Skin:  Skin is warm, dry and intact. No rash noted.   ____________________________________________   LABS (all labs ordered are listed, but only abnormal results are displayed)  Labs Reviewed  BASIC METABOLIC PANEL - Abnormal; Notable for the following:       Result Value   GFR calc non Af Amer 57 (*)    All other components within normal limits  URINALYSIS, ROUTINE W REFLEX MICROSCOPIC (NOT AT [facility]) - Abnormal; Notable for the following:    APPearance CLOUDY (*)    Leukocytes, UA LARGE  (*)    All other components within normal limits  URINE MICROSCOPIC-ADD ON - Abnormal; Notable for the following:    Squamous Epithelial / LPF 0-5 (*)    Bacteria, UA MANY (*)    All other components within normal limits  CBC WITH DIFFERENTIAL/PLATELET   ____________________________________________  EKG   EKG Interpretation  Date/Time:  Monday January 28 2016 10:19:59 EDT Ventricular Rate:  58 PR Interval:    QRS Duration: 95 QT Interval:  438 QTC Calculation: 431 R Axis:   -47 Text Interpretation:  Sinus rhythm LAD, consider left anterior fascicular block Left ventricular hypertrophy No STEMI.  Similar to prior  Confirmed by Laural Eiland MD, Janellie Tennison [phone]) on 01/28/2016 10:24:17 AM       ____________________________________________  RADIOLOGY  Dg Lumbar Spine 2-3 Views  Result Date: 01/28/2016 CLINICAL DATA:  Fall.  Low back pain. EXAM: LUMBAR SPINE - 2-3 VIEW COMPARISON:  01/09/2013 CT lumbar spine. FINDINGS: This report assumes 5 non rib-bearing lumbar vertebrae. Stable mild levocurvature of the lower lumbar spine. Lumbar vertebral body heights are preserved, with no lumbar spine fracture. There is a new cortical discontinuity at the sacrococcygeal junction compared to the 05/27/2009 sagittal reconstructions from the CT abdomen/pelvis study, suggesting a nondisplaced fracture in this location. Marked multilevel lumbar degenerative disc disease, most prominent at L3-4, not appreciably changed. No new malalignment. Stable minimal 2 mm retrolisthesis at L3-4 and minimal 2 mm anterolisthesis at L5-S1. Bilateral facet arthropathy in the lower lumbar spine. No aggressive appearing focal osseous lesions. Aortic atherosclerosis. IMPRESSION: 1. Nondisplaced sacrococcygeal junction fracture, new since 05/27/2009 CT study, probably acute. 2. No fracture or new malalignment in the lumbar spine . 3. Severe multilevel degenerative disc disease in the lumbar spine, most prominent at L3-4, unchanged. 4.  Stable minimal spondylolisthesis at L3-4 and L5-S1, probably degenerative. 5. Facet arthropathy bilaterally in the lower lumbar spine. 6. Aortic atherosclerosis. Electronically Signed   By: Delbert Phenix M.D.   On: 01/28/2016 10:52   Ct Head Wo Contrast  Result Date: 01/28/2016 CLINICAL DATA:  Fall. EXAM: CT HEAD WITHOUT CONTRAST CT CERVICAL SPINE WITHOUT CONTRAST TECHNIQUE: Multidetector CT imaging of the head and cervical spine was performed following the standard protocol without intravenous contrast. Multiplanar CT image reconstructions of the cervical spine were also generated. COMPARISON:  09/23/2015 FINDINGS: CT HEAD FINDINGS Brain: No evidence of acute infarction, hemorrhage, hydrocephalus, extra-axial collection or mass lesion/mass effect.Prominence of the sulci and ventricles are identified compatible with brain atrophy. There is low attenuation throughout the subcortical and periventricular white matter compatible with chronic microvascular disease. Vascular: No hyperdense vessel or unexpected calcification. Skull: Normal. Negative for fracture or focal lesion. Sinuses/Orbits: No acute finding. Other: None CT CERVICAL SPINE FINDINGS Alignment: Anterolisthesis of C4 on C5 is likely related to chronic spondylosis. There is a retrolisthesis of C6 on C7. Also likely related to spondylosis. Skull base and vertebrae: No acute fracture. No primary bone lesion or focal pathologic  process. Soft tissues and spinal canal: No prevertebral fluid or swelling. No visible canal hematoma. Disc levels: Multi level disc space narrowing and ventral endplate spurring is identified. This is most advanced at C5-6 and C6-C7. Upper chest: Negative. Other: Carotid artery calcifications from aortic atherosclerosis noted. IMPRESSION: 1. No acute intracranial abnormalities. 2. Small vessel ischemic change and brain atrophy. 3. Advanced cervical spondylosis. Multi level degenerative disc disease is identified. 4. No evidence for  cervical spine fracture or dislocation. Electronically Signed   By: Signa Kell M.D.   On: 01/28/2016 10:57   Ct Cervical Spine Wo Contrast  Result Date: 01/28/2016 CLINICAL DATA:  Fall. EXAM: CT HEAD WITHOUT CONTRAST CT CERVICAL SPINE WITHOUT CONTRAST TECHNIQUE: Multidetector CT imaging of the head and cervical spine was performed following the standard protocol without intravenous contrast. Multiplanar CT image reconstructions of the cervical spine were also generated. COMPARISON:  09/23/2015 FINDINGS: CT HEAD FINDINGS Brain: No evidence of acute infarction, hemorrhage, hydrocephalus, extra-axial collection or mass lesion/mass effect.Prominence of the sulci and ventricles are identified compatible with brain atrophy. There is low attenuation throughout the subcortical and periventricular white matter compatible with chronic microvascular disease. Vascular: No hyperdense vessel or unexpected calcification. Skull: Normal. Negative for fracture or focal lesion. Sinuses/Orbits: No acute finding. Other: None CT CERVICAL SPINE FINDINGS Alignment: Anterolisthesis of C4 on C5 is likely related to chronic spondylosis. There is a retrolisthesis of C6 on C7. Also likely related to spondylosis. Skull base and vertebrae: No acute fracture. No primary bone lesion or focal pathologic process. Soft tissues and spinal canal: No prevertebral fluid or swelling. No visible canal hematoma. Disc levels: Multi level disc space narrowing and ventral endplate spurring is identified. This is most advanced at C5-6 and C6-C7. Upper chest: Negative. Other: Carotid artery calcifications from aortic atherosclerosis noted. IMPRESSION: 1. No acute intracranial abnormalities. 2. Small vessel ischemic change and brain atrophy. 3. Advanced cervical spondylosis. Multi level degenerative disc disease is identified. 4. No evidence for cervical spine fracture or dislocation. Electronically Signed   By: Signa Kell M.D.   On: 01/28/2016 10:57     Dg Hips Bilat With Pelvis 2v  Result Date: 01/28/2016 CLINICAL DATA:  Fall.  Bilateral hip pain. EXAM: DG HIP (WITH OR WITHOUT PELVIS) 2V BILAT COMPARISON:  10/03/2015 bilateral hip radiographs. FINDINGS: No fracture or diastasis evident in the pelvis. No hip fracture or dislocation. No suspicious focal osseous lesion. Spondylosis in the visualized lower lumbar spine. Mild bilateral hip osteoarthritis. IMPRESSION: 1. No fracture evident in the pelvis. The nondisplaced sacrococcygeal junction fracture seen on the lumbar spine radiographs from today is not evident on these views. 2. No hip fracture or malalignment. Electronically Signed   By: Delbert Phenix M.D.   On: 01/28/2016 10:54    ____________________________________________   PROCEDURES  Procedure(s) performed:   Procedures  None ____________________________________________   INITIAL IMPRESSION / ASSESSMENT AND PLAN / ED COURSE  Pertinent labs & imaging results that were available during my care of the patient were reviewed by me and considered in my medical decision making (see chart for details).  Patient resents to the emergency department for evaluation of lower back pain and hip pain after a fall. Patient has baseline Alzheimer's disease which limits the HPI and ROS. EMS had suspicion that this was mechanical fall without difficulty confirming this. Because of this I'll obtain baseline labs and EKG with CT scans of the head, neck. We'll also obtain plain films of the lumbar spine and bilateral hips.  12:53 PM Patient still with significant discomfort. Will give small amount of morphine and reassess. Awaiting callback from orthopedics. Plan for admission for pain control and physical therapy as the patient is not ambulatory with her current pain level.   01:16 PM Spoke with Dr. Roda ShuttersXu with orthopedic surgery. No surgical intervention at this time. Plan for pain control admit and PT/PT evaluation as the patient is in too much pain  to even attempt ambulation. Can follow up with Dr. Roda ShuttersXu in 2-3 weeks after discharge.   Discussed patient's case with hospitalist, Dr. Konrad DoloresMerrell.  Recommend admission to obs, med/surg bed.  I will place holding orders per their request. Patient and family (if present) updated with plan. Care transferred to hospitalist service.  I reviewed all nursing notes, vitals, pertinent old records, EKGs, labs, imaging (as available).  ____________________________________________  FINAL CLINICAL IMPRESSION(S) / ED DIAGNOSES  Final diagnoses:  Hip pain  Fall, initial encounter  Closed fracture of sacrum, unspecified portion of sacrum, initial encounter (HCC)     MEDICATIONS GIVEN DURING THIS VISIT:  Medications  acetaminophen (TYLENOL) tablet 1,000 mg (1,000 mg Oral Given 01/28/16 1014)  morphine 2 MG/ML injection 2 mg (2 mg Intravenous Given 01/28/16 1254)     NEW OUTPATIENT MEDICATIONS STARTED DURING THIS VISIT:  None   Note:  This document was prepared using Dragon voice recognition software and may include unintentional dictation errors.  Alona BeneJoshua Harmoni Lucus, MD Emergency Medicine   Maia PlanJoshua G Rishon Thilges, MD 01/28/16 [phone]

## 2016-01-29 DIAGNOSIS — S3210XD Unspecified fracture of sacrum, subsequent encounter for fracture with routine healing: Secondary | ICD-10-CM

## 2016-01-29 DIAGNOSIS — S3210XA Unspecified fracture of sacrum, initial encounter for closed fracture: Secondary | ICD-10-CM | POA: Diagnosis not present

## 2016-01-29 LAB — CBC
HEMATOCRIT: 35.6 % — AB (ref 36.0–46.0)
HEMOGLOBIN: 11.3 g/dL — AB (ref 12.0–15.0)
MCH: 26.5 pg (ref 26.0–34.0)
MCHC: 31.7 g/dL (ref 30.0–36.0)
MCV: 83.6 fL (ref 78.0–100.0)
PLATELETS: 199 10*3/uL (ref 150–400)
RBC: 4.26 MIL/uL (ref 3.87–5.11)
RDW: 13.1 % (ref 11.5–15.5)
WBC: 6.4 10*3/uL (ref 4.0–10.5)

## 2016-01-29 LAB — MRSA PCR SCREENING: MRSA by PCR: NEGATIVE

## 2016-01-29 LAB — BASIC METABOLIC PANEL
ANION GAP: 9 (ref 5–15)
BUN: 16 mg/dL (ref 6–20)
CALCIUM: 9.2 mg/dL (ref 8.9–10.3)
CO2: 23 mmol/L (ref 22–32)
CREATININE: 0.76 mg/dL (ref 0.44–1.00)
Chloride: 104 mmol/L (ref 101–111)
GFR calc non Af Amer: >60 mL/min (ref >60)
Glucose, Bld: 107 mg/dL — ABNORMAL HIGH (ref 65–99)
Potassium: 3.8 mmol/L (ref 3.5–5.1)
SODIUM: 136 mmol/L (ref 135–145)

## 2016-01-29 MED ORDER — ACETAMINOPHEN 325 MG PO TABS: 650.0000 mg | ORAL_TABLET | Freq: Four times a day (QID) | ORAL | Status: DC | PRN

## 2016-01-29 MED ORDER — HYDROCODONE-ACETAMINOPHEN 5-325 MG PO TABS: 1.0000 | ORAL_TABLET | Freq: Four times a day (QID) | ORAL | 0 refills | Status: DC | PRN

## 2016-01-29 NOTE — Progress Notes (Addendum)
NURSING PROGRESS Tara Hicks  Tara SmithMRN: [id number] Admission Data: 01/28/2016 at 9:30PM Attending Provider: Eduard ClosArshad N Kakrakandy, MD PCP: Florentina JennyRIPP, HENRY, MD Code status: DNR  Allergies:  Allergies  Allergen Reactions  . Phenergan [Promethazine] Other (See Comments)    hyperactive   Past Medical History:  Past Medical History:  Diagnosis Date  . Arthritis   . Cataract   . Complication of anesthesia   . Dementia   . GERD (gastroesophageal reflux disease)   . Hypertension   . Hypothyroidism   . Leaky heart valve    Hx: of  . PONV (postoperative nausea and vomiting)   . TIA (transient ischemic attack)    Hx: of 2014   Past Surgical History:  Past Surgical History:  Procedure Laterality Date  . BREAST SURGERY     Hx: of left breast cyst removal  . CATARACT EXTRACTION    . COLONOSCOPY     Hx: of  . DILATION AND CURETTAGE OF UTERUS    . LUMBAR LAMINECTOMY/DECOMPRESSION MICRODISCECTOMY Left 01/31/2013   Procedure: Left lumbar five-sacral one laminectomy for synovial cyst;  Surgeon: Reinaldo Meekerandy O Kritzer, MD;  Location: [facility];  Service: Neurosurgery;  Laterality: Left;   Tara Hicks is a 82 y.o. female patient, arrived to floor in room 5W03 via [facility], [facility]. Patient alert and oriented to self. No acute distress noted.Complains of pain in sacrum.  Vital signs: Oral temperature 97.4 F (36.3 C), Blood pressure 152/65, Pulse 58, RR 18, SpO2 94 % on room air. Height 5'4", weight 118 lbs (53.9 kg).   Cardiac monitoring: Telemetry box 5W #29 in place. Verified by Bayard Beaverammy R, Nurse Tech  IV access: Right forearm; condition patent and no redness; clean, dry, and intact  Skin: intact, no pressure ulcer noted in sacral area. Second verified by Dell Pontolare D., RN   Patient's ID armband verified with patient and in place. Information packet given to patient. Fall risk assessed, SR up X2.  Call bell within reach. Patient instructed how to use the call  bed when needing assistance.

## 2016-01-29 NOTE — Progress Notes (Signed)
Nsg Discharge Note  Admit Date:  01/28/2016 Discharge date: 01/29/2016   Melina CopaMatilda Mcsweeney to be D/C'd Home (ALF)  per MD order.  AVS completed.  Copy for chart, and copy for patient signed, and dated. Patient/caregiver able to verbalize understanding.  Discharge Medication:   Medication List    TAKE these medications   acetaminophen 325 MG tablet Commonly known as:  TYLENOL Take 2 tablets (650 mg total) by mouth every 6 (six) hours as needed for mild pain (or Fever >/= 101).   clopidogrel 75 MG tablet Commonly known as:  PLAVIX Take 75 mg by mouth daily.   diazepam 2 MG tablet Commonly known as:  VALIUM Take 2 mg by mouth every 6 (six) hours as needed for anxiety.   donepezil 10 MG tablet Commonly known as:  ARICEPT Take 10 mg by mouth at bedtime.   guaiFENesin-codeine 100-10 MG/5ML syrup Commonly known as:  ROBITUSSIN AC Take 5 mLs by mouth 3 (three) times daily as needed for cough.   HYDROcodone-acetaminophen 5-325 MG tablet Commonly known as:  NORCO/VICODIN Take 1 tablet by mouth every 6 (six) hours as needed for moderate pain.   levothyroxine 50 MCG tablet Commonly known as:  SYNTHROID, LEVOTHROID Take 50 mcg by mouth 3 (three) times a week. Take on Mon, Wed, Fri What changed:  Another medication with the same name was removed. Continue taking this medication, and follow the directions you see here.   lisinopril 10 MG tablet Commonly known as:  PRINIVIL,ZESTRIL Take 10 mg by mouth daily.   multivitamin with minerals Tabs tablet Take 1 tablet by mouth daily.   ondansetron 4 MG tablet Commonly known as:  ZOFRAN Take 4 mg by mouth every 8 (eight) hours as needed for nausea.   QUEtiapine 50 MG tablet Commonly known as:  SEROQUEL Take 50 mg by mouth at bedtime.   vitamin C 1000 MG tablet Take 1,000 mg by mouth daily.       Discharge Assessment: Vitals:   01/29/16 0500 01/29/16 1004  BP: 132/68 (!) 167/78  Pulse: (!) 55   Resp: 18   Temp: 97.8 F (36.6  C)    Skin clean, dry and intact without evidence of skin break down, no evidence of skin tears noted. IV catheter discontinued intact. Site without signs and symptoms of complications - no redness or edema noted at insertion site, patient denies c/o pain - only slight tenderness at site.  Dressing with slight pressure applied.  D/c Instructions-Education: Discharge instructions given to patient/family with verbalized understanding. D/c education completed with patient/family including follow up instructions, medication list, d/c activities limitations if indicated, with other d/c instructions as indicated by MD - patient able to verbalize understanding, all questions fully answered. Patient instructed to return to ED, call 911, or call MD for any changes in condition.  Patient escorted via WC, and D/C home via private auto.  Chloe Flis Consuella Loselaine, RN 01/29/2016 3:44 PM

## 2016-01-29 NOTE — Discharge Summary (Signed)
Physician Discharge Summary  Tara Hicks ZOX:[id number] DOB: [birth year] DOA: 01/28/2016  PCP: Florentina Jenny, MD  Admit date: 01/28/2016 Discharge date: 01/29/2016  Time spent: 35 minutes  Recommendations for Outpatient Follow-up:  1. PCP Dr.Tripp in 1 week 2. Ortho Dr.Xu in 3 weeks   Discharge Diagnoses:  Active Problems:   Sacral fracture, closed (HCC)   Dementia   Hypothyroidism   Essential hypertension   Discharge Condition: stable  Diet recommendation: low sodium  Filed Weights   01/28/16 2257  Weight: 53.9 kg (118 lb 13.3 oz)    History of present illness:  Tara Smithis a 82 y.o.femalewith a past medical hx of HTN, GERD, hypothyroidism, and dementia presented with a mechanical fall.  Lumbar spine XRAY shows nondisplaced sacrococcygeal junction fracture Orthopedic surgeon Dr.Xu on call was consulted and orthopedic surgeon recommended conservative management and to follow-up with them in 3 weeks  Hospital Course:  1. Sacralfracture - secondary to mechanical fall.  -Xray revealed a nondisplaced sacrococcygeal junction fracture -Orthopedics was consulted and Dr.Xu recommended conservative management and follow with them in three weeks -PT  consulted, no PT follow up recommended, discharged back to ALF -Tylenol PRN, vicodin only for severe pain  2. HTN  -stable, on lisinopril  3. Dementia -pleasantly confused, but alert and oriented -continue Aricept/seroquel and Valium per home regimen -discharged back to ALF  4.  Hypothyroidism -continue Levothroid  Consultations:  Dr.Xu  Discharge Exam: Vitals:   01/29/16 0500 01/29/16 1004  BP: 132/68 (!) 167/78  Pulse: (!) 55   Resp: 18   Temp: 97.8 F (36.6 C)     General: AAOx3 Cardiovascular: S1S2/RRR Respiratory: CTAB  Discharge Instructions   Discharge Instructions    Diet - low sodium heart healthy    Complete by:  As directed    Increase activity slowly    Complete by:  As directed       Current Discharge Medication List    START taking these medications   Details  acetaminophen (TYLENOL) 325 MG tablet Take 2 tablets (650 mg total) by mouth every 6 (six) hours as needed for mild pain (or Fever >/= 101).    HYDROcodone-acetaminophen (NORCO/VICODIN) 5-325 MG tablet Take 1 tablet by mouth every 6 (six) hours as needed for moderate pain. Qty: 30 tablet, Refills: 0      CONTINUE these medications which have NOT CHANGED   Details  Ascorbic Acid (VITAMIN C) 1000 MG tablet Take 1,000 mg by mouth daily.    clopidogrel (PLAVIX) 75 MG tablet Take 75 mg by mouth daily.    diazepam (VALIUM) 2 MG tablet Take 2 mg by mouth every 6 (six) hours as needed for anxiety.    donepezil (ARICEPT) 10 MG tablet Take 10 mg by mouth at bedtime.    guaiFENesin-codeine (ROBITUSSIN AC) 100-10 MG/5ML syrup Take 5 mLs by mouth 3 (three) times daily as needed for cough.    levothyroxine (SYNTHROID, LEVOTHROID) 50 MCG tablet Take 50 mcg by mouth 3 (three) times a week. Take on Mon, Wed, Fri    lisinopril (PRINIVIL,ZESTRIL) 10 MG tablet Take 10 mg by mouth daily.    Multiple Vitamin (MULTIVITAMIN WITH MINERALS) TABS tablet Take 1 tablet by mouth daily.    ondansetron (ZOFRAN) 4 MG tablet Take 4 mg by mouth every 8 (eight) hours as needed for nausea.    QUEtiapine (SEROQUEL) 50 MG tablet Take 50 mg by mouth at bedtime.       Allergies  Allergen Reactions  . Phenergan [Promethazine] Other (  See Comments)    hyperactive      The results of significant diagnostics from this hospitalization (including imaging, microbiology, ancillary and laboratory) are listed below for reference.    Significant Diagnostic Studies: Dg Lumbar Spine 2-3 Views  Result Date: 01/28/2016 CLINICAL DATA:  Fall.  Low back pain. EXAM: LUMBAR SPINE - 2-3 VIEW COMPARISON:  01/09/2013 CT lumbar spine. FINDINGS: This report assumes 5 non rib-bearing lumbar vertebrae. Stable mild levocurvature of the lower lumbar  spine. Lumbar vertebral body heights are preserved, with no lumbar spine fracture. There is a new cortical discontinuity at the sacrococcygeal junction compared to the 05/27/2009 sagittal reconstructions from the CT abdomen/pelvis study, suggesting a nondisplaced fracture in this location. Marked multilevel lumbar degenerative disc disease, most prominent at L3-4, not appreciably changed. No new malalignment. Stable minimal 2 mm retrolisthesis at L3-4 and minimal 2 mm anterolisthesis at L5-S1. Bilateral facet arthropathy in the lower lumbar spine. No aggressive appearing focal osseous lesions. Aortic atherosclerosis. IMPRESSION: 1. Nondisplaced sacrococcygeal junction fracture, new since 05/27/2009 CT study, probably acute. 2. No fracture or new malalignment in the lumbar spine . 3. Severe multilevel degenerative disc disease in the lumbar spine, most prominent at L3-4, unchanged. 4. Stable minimal spondylolisthesis at L3-4 and L5-S1, probably degenerative. 5. Facet arthropathy bilaterally in the lower lumbar spine. 6. Aortic atherosclerosis. Electronically Signed   By: Delbert Phenix M.D.   On: 01/28/2016 10:52   Ct Head Wo Contrast  Result Date: 01/28/2016 CLINICAL DATA:  Fall. EXAM: CT HEAD WITHOUT CONTRAST CT CERVICAL SPINE WITHOUT CONTRAST TECHNIQUE: Multidetector CT imaging of the head and cervical spine was performed following the standard protocol without intravenous contrast. Multiplanar CT image reconstructions of the cervical spine were also generated. COMPARISON:  09/23/2015 FINDINGS: CT HEAD FINDINGS Brain: No evidence of acute infarction, hemorrhage, hydrocephalus, extra-axial collection or mass lesion/mass effect.Prominence of the sulci and ventricles are identified compatible with brain atrophy. There is low attenuation throughout the subcortical and periventricular white matter compatible with chronic microvascular disease. Vascular: No hyperdense vessel or unexpected calcification. Skull: Normal.  Negative for fracture or focal lesion. Sinuses/Orbits: No acute finding. Other: None CT CERVICAL SPINE FINDINGS Alignment: Anterolisthesis of C4 on C5 is likely related to chronic spondylosis. There is a retrolisthesis of C6 on C7. Also likely related to spondylosis. Skull base and vertebrae: No acute fracture. No primary bone lesion or focal pathologic process. Soft tissues and spinal canal: No prevertebral fluid or swelling. No visible canal hematoma. Disc levels: Multi level disc space narrowing and ventral endplate spurring is identified. This is most advanced at C5-6 and C6-C7. Upper chest: Negative. Other: Carotid artery calcifications from aortic atherosclerosis noted. IMPRESSION: 1. No acute intracranial abnormalities. 2. Small vessel ischemic change and brain atrophy. 3. Advanced cervical spondylosis. Multi level degenerative disc disease is identified. 4. No evidence for cervical spine fracture or dislocation. Electronically Signed   By: Signa Kell M.D.   On: 01/28/2016 10:57   Ct Cervical Spine Wo Contrast  Result Date: 01/28/2016 CLINICAL DATA:  Fall. EXAM: CT HEAD WITHOUT CONTRAST CT CERVICAL SPINE WITHOUT CONTRAST TECHNIQUE: Multidetector CT imaging of the head and cervical spine was performed following the standard protocol without intravenous contrast. Multiplanar CT image reconstructions of the cervical spine were also generated. COMPARISON:  09/23/2015 FINDINGS: CT HEAD FINDINGS Brain: No evidence of acute infarction, hemorrhage, hydrocephalus, extra-axial collection or mass lesion/mass effect.Prominence of the sulci and ventricles are identified compatible with brain atrophy. There is low attenuation throughout the subcortical and  periventricular white matter compatible with chronic microvascular disease. Vascular: No hyperdense vessel or unexpected calcification. Skull: Normal. Negative for fracture or focal lesion. Sinuses/Orbits: No acute finding. Other: None CT CERVICAL SPINE FINDINGS  Alignment: Anterolisthesis of C4 on C5 is likely related to chronic spondylosis. There is a retrolisthesis of C6 on C7. Also likely related to spondylosis. Skull base and vertebrae: No acute fracture. No primary bone lesion or focal pathologic process. Soft tissues and spinal canal: No prevertebral fluid or swelling. No visible canal hematoma. Disc levels: Multi level disc space narrowing and ventral endplate spurring is identified. This is most advanced at C5-6 and C6-C7. Upper chest: Negative. Other: Carotid artery calcifications from aortic atherosclerosis noted. IMPRESSION: 1. No acute intracranial abnormalities. 2. Small vessel ischemic change and brain atrophy. 3. Advanced cervical spondylosis. Multi level degenerative disc disease is identified. 4. No evidence for cervical spine fracture or dislocation. Electronically Signed   By: Signa Kell M.D.   On: 01/28/2016 10:57   Dg Hips Bilat With Pelvis 2v  Result Date: 01/28/2016 CLINICAL DATA:  Fall.  Bilateral hip pain. EXAM: DG HIP (WITH OR WITHOUT PELVIS) 2V BILAT COMPARISON:  10/03/2015 bilateral hip radiographs. FINDINGS: No fracture or diastasis evident in the pelvis. No hip fracture or dislocation. No suspicious focal osseous lesion. Spondylosis in the visualized lower lumbar spine. Mild bilateral hip osteoarthritis. IMPRESSION: 1. No fracture evident in the pelvis. The nondisplaced sacrococcygeal junction fracture seen on the lumbar spine radiographs from today is not evident on these views. 2. No hip fracture or malalignment. Electronically Signed   By: Delbert Phenix M.D.   On: 01/28/2016 10:54    Microbiology: Recent Results (from the past 240 hour(s))  MRSA PCR Screening     Status: None   Collection Time: 01/28/16 10:22 PM  Result Value Ref Range Status   MRSA by PCR NEGATIVE NEGATIVE Final    Comment:        The GeneXpert MRSA Assay (FDA approved for NASAL specimens only), is one component of a comprehensive MRSA  colonization surveillance program. It is not intended to diagnose MRSA infection nor to guide or monitor treatment for MRSA infections.      Labs: Basic Metabolic Panel:  Recent Labs Lab 01/28/16 1045 01/28/16 2222 01/29/16 0520  NA 135  --  136  K 4.4  --  3.8  CL 103  --  104  CO2 23  --  23  GLUCOSE 90  --  107*  BUN 17  --  16  CREATININE 0.86 0.88 0.76  CALCIUM 9.9  --  9.2   Liver Function Tests: No results for input(s): AST, ALT, ALKPHOS, BILITOT, PROT, ALBUMIN in the last 168 hours. No results for input(s): LIPASE, AMYLASE in the last 168 hours. No results for input(s): AMMONIA in the last 168 hours. CBC:  Recent Labs Lab 01/28/16 1045 01/28/16 2222 01/29/16 0520  WBC 7.3 7.1 6.4  NEUTROABS 4.6  --   --   HGB 12.0 11.6* 11.3*  HCT 36.2 36.7 35.6*  MCV 82.1 82.5 83.6  PLT 221 225 199   Cardiac Enzymes: No results for input(s): CKTOTAL, CKMB, CKMBINDEX, TROPONINI in the last 168 hours. BNP: BNP (last 3 results) No results for input(s): BNP in the last 8760 hours.  ProBNP (last 3 results) No results for input(s): PROBNP in the last 8760 hours.  CBG: No results for input(s): GLUCAP in the last 168 hours.     SignedZannie Cove MD.  [facility]  01/29/2016, 2:46 PM

## 2016-01-29 NOTE — Progress Notes (Signed)
[facility] and DC summary received  CSW will continue to follow  Burna SisJenna H. Abas Leicht, [facility] LL[facility]SWA Clinical Social Worker [phone]

## 2016-01-29 NOTE — Progress Notes (Signed)
DC summary sent to facility  [facility] Anticipated discharge date: 10/10 Family notified: son at bedside Transportation by pt son- RN to release when pt ready  CSW signing off.  Burna SisJenna H. Bain Whichard, LCSWA Clinical Social Worker [phone]

## 2016-01-29 NOTE — Progress Notes (Signed)
PROGRESS NOTE    Tara Hicks  ZOX:[id number] DOB: [birth year] DOA: 01/28/2016 PCP: Florentina Jenny, MD Brief Narrative: Tara Hicks is a 82 y.o. female with a past medical hx of HTN, GERD, hypothyroidism, and dementia presented with a mechanical fall.  Lumbar spine  XRAY shows nondisplaced sacrococcygeal junction fracture Orthopedic surgeon Dr.Xu on call was consulted and orthopedic surgeon recommended conservative management and to follow-up with them in 3 weeks   Assessment & Plan:  1. Sacral fracture - secondary to mechanical fall.  -Xray revealed a nondisplaced sacrococcygeal junction fracture -Orthopedics was consulted and Dr.Xu recommended conservative management and follow with them in three weeks -PT and OT consulted, to determine safe disposition -Tylenol PRN, low dose morphine only for severe pain  2. HTN  -stable, on lisinopril  3. Dementia -pleasantly confused, but alert and oriented -continue Aricept/seroquel and Valium per home regimen  4.  Hypothyroidism -continue Levothroid.   DVT prophylaxis: lovenox Code Status:DNR Family Communication:no family at bedside Disposition Plan:ALF vs SNF, await PT/OT eval  Consultants:   D/w Ortho-DR.Xu  Subjective: No complaints, pleasantly confused  Objective: Vitals:   01/28/16 2257 01/28/16 2302 01/29/16 0500 01/29/16 1004  BP:  (!) 152/65 132/68 (!) 167/78  Pulse:  (!) 58 (!) 55   Resp:  18 18   Temp:  97.4 F (36.3 C) 97.8 F (36.6 C)   TempSrc:  Oral Oral   SpO2:  94% 95%   Weight: 53.9 kg (118 lb 13.3 oz)     Height: 5\' 4"  (1.626 m)       Intake/Output Summary (Last 24 hours) at 01/29/16 1140 Last data filed at 01/29/16 0500  Gross per 24 hour  Intake            547.5 ml  Output                0 ml  Net            547.5 ml   Filed Weights   01/28/16 2257  Weight: 53.9 kg (118 lb 13.3 oz)    Examination:  General exam: Appears calm and comfortable, confused Respiratory system: Clear to  auscultation. Respiratory effort normal. Cardiovascular system: S1 & S2 heard, RRR. No JVD, murmurs, rubs, gallops or clicks. No pedal edema. Gastrointestinal system: Abdomen is nondistended, soft and nontender. No organomegaly or masses felt. Normal bowel sounds heard. Central nervous system: Alert and oriented. No focal neurological deficits. Extremities: Symmetric 5 x 5 power. Skin: No rashes, lesions or ulcers Psychiatry: confused due to dementia    Data Reviewed: I have personally reviewed following labs and imaging studies  CBC:  Recent Labs Lab 01/28/16 1045 01/28/16 2222 01/29/16 0520  WBC 7.3 7.1 6.4  NEUTROABS 4.6  --   --   HGB 12.0 11.6* 11.3*  HCT 36.2 36.7 35.6*  MCV 82.1 82.5 83.6  PLT 221 225 199   Basic Metabolic Panel:  Recent Labs Lab 01/28/16 1045 01/28/16 2222 01/29/16 0520  NA 135  --  136  K 4.4  --  3.8  CL 103  --  104  CO2 23  --  23  GLUCOSE 90  --  107*  BUN 17  --  16  CREATININE 0.86 0.88 0.76  CALCIUM 9.9  --  9.2   GFR: Estimated Creatinine Clearance: 39 mL/min (by C-G formula based on SCr of 0.76 mg/dL). Liver Function Tests: No results for input(s): AST, ALT, ALKPHOS, BILITOT, PROT, ALBUMIN in the last 168 hours. No  results for input(s): LIPASE, AMYLASE in the last 168 hours. No results for input(s): AMMONIA in the last 168 hours. Coagulation Profile: No results for input(s): INR, PROTIME in the last 168 hours. Cardiac Enzymes: No results for input(s): CKTOTAL, CKMB, CKMBINDEX, TROPONINI in the last 168 hours. BNP (last 3 results) No results for input(s): PROBNP in the last 8760 hours. HbA1C: No results for input(s): HGBA1C in the last 72 hours. CBG: No results for input(s): GLUCAP in the last 168 hours. Lipid Profile: No results for input(s): CHOL, HDL, LDLCALC, TRIG, CHOLHDL, LDLDIRECT in the last 72 hours. Thyroid Function Tests: No results for input(s): TSH, T4TOTAL, FREET4, T3FREE, THYROIDAB in the last 72  hours. Anemia Panel: No results for input(s): VITAMINB12, FOLATE, FERRITIN, TIBC, IRON, RETICCTPCT in the last 72 hours. Urine analysis:    Component Value Date/Time   COLORURINE YELLOW 01/28/2016 1400   APPEARANCEUR CLOUDY (A) 01/28/2016 1400   LABSPEC 1.012 01/28/2016 1400   PHURINE 8.0 01/28/2016 1400   GLUCOSEU NEGATIVE 01/28/2016 1400   HGBUR NEGATIVE 01/28/2016 1400   BILIRUBINUR NEGATIVE 01/28/2016 1400   KETONESUR NEGATIVE 01/28/2016 1400   PROTEINUR NEGATIVE 01/28/2016 1400   UROBILINOGEN 1.0 07/08/2014 1132   NITRITE NEGATIVE 01/28/2016 1400   LEUKOCYTESUR LARGE (A) 01/28/2016 1400   Sepsis Labs: @LABRCNTIP (procalcitonin:4,lacticidven:4)  ) Recent Results (from the past 240 hour(s))  MRSA PCR Screening     Status: None   Collection Time: 01/28/16 10:22 PM  Result Value Ref Range Status   MRSA by PCR NEGATIVE NEGATIVE Final    Comment:        The GeneXpert MRSA Assay (FDA approved for NASAL specimens only), is one component of a comprehensive MRSA colonization surveillance program. It is not intended to diagnose MRSA infection nor to guide or monitor treatment for MRSA infections.          Radiology Studies: Dg Lumbar Spine 2-3 Views  Result Date: 01/28/2016 CLINICAL DATA:  Fall.  Low back pain. EXAM: LUMBAR SPINE - 2-3 VIEW COMPARISON:  01/09/2013 CT lumbar spine. FINDINGS: This report assumes 5 non rib-bearing lumbar vertebrae. Stable mild levocurvature of the lower lumbar spine. Lumbar vertebral body heights are preserved, with no lumbar spine fracture. There is a new cortical discontinuity at the sacrococcygeal junction compared to the 05/27/2009 sagittal reconstructions from the CT abdomen/pelvis study, suggesting a nondisplaced fracture in this location. Marked multilevel lumbar degenerative disc disease, most prominent at L3-4, not appreciably changed. No new malalignment. Stable minimal 2 mm retrolisthesis at L3-4 and minimal 2 mm anterolisthesis at  L5-S1. Bilateral facet arthropathy in the lower lumbar spine. No aggressive appearing focal osseous lesions. Aortic atherosclerosis. IMPRESSION: 1. Nondisplaced sacrococcygeal junction fracture, new since 05/27/2009 CT study, probably acute. 2. No fracture or new malalignment in the lumbar spine . 3. Severe multilevel degenerative disc disease in the lumbar spine, most prominent at L3-4, unchanged. 4. Stable minimal spondylolisthesis at L3-4 and L5-S1, probably degenerative. 5. Facet arthropathy bilaterally in the lower lumbar spine. 6. Aortic atherosclerosis. Electronically Signed   By: Delbert Phenix M.D.   On: 01/28/2016 10:52   Ct Head Wo Contrast  Result Date: 01/28/2016 CLINICAL DATA:  Fall. EXAM: CT HEAD WITHOUT CONTRAST CT CERVICAL SPINE WITHOUT CONTRAST TECHNIQUE: Multidetector CT imaging of the head and cervical spine was performed following the standard protocol without intravenous contrast. Multiplanar CT image reconstructions of the cervical spine were also generated. COMPARISON:  09/23/2015 FINDINGS: CT HEAD FINDINGS Brain: No evidence of acute infarction, hemorrhage, hydrocephalus, extra-axial collection  or mass lesion/mass effect.Prominence of the sulci and ventricles are identified compatible with brain atrophy. There is low attenuation throughout the subcortical and periventricular white matter compatible with chronic microvascular disease. Vascular: No hyperdense vessel or unexpected calcification. Skull: Normal. Negative for fracture or focal lesion. Sinuses/Orbits: No acute finding. Other: None CT CERVICAL SPINE FINDINGS Alignment: Anterolisthesis of C4 on C5 is likely related to chronic spondylosis. There is a retrolisthesis of C6 on C7. Also likely related to spondylosis. Skull base and vertebrae: No acute fracture. No primary bone lesion or focal pathologic process. Soft tissues and spinal canal: No prevertebral fluid or swelling. No visible canal hematoma. Disc levels: Multi level disc  space narrowing and ventral endplate spurring is identified. This is most advanced at C5-6 and C6-C7. Upper chest: Negative. Other: Carotid artery calcifications from aortic atherosclerosis noted. IMPRESSION: 1. No acute intracranial abnormalities. 2. Small vessel ischemic change and brain atrophy. 3. Advanced cervical spondylosis. Multi level degenerative disc disease is identified. 4. No evidence for cervical spine fracture or dislocation. Electronically Signed   By: Signa Kellaylor  Stroud M.D.   On: 01/28/2016 10:57   Ct Cervical Spine Wo Contrast  Result Date: 01/28/2016 CLINICAL DATA:  Fall. EXAM: CT HEAD WITHOUT CONTRAST CT CERVICAL SPINE WITHOUT CONTRAST TECHNIQUE: Multidetector CT imaging of the head and cervical spine was performed following the standard protocol without intravenous contrast. Multiplanar CT image reconstructions of the cervical spine were also generated. COMPARISON:  09/23/2015 FINDINGS: CT HEAD FINDINGS Brain: No evidence of acute infarction, hemorrhage, hydrocephalus, extra-axial collection or mass lesion/mass effect.Prominence of the sulci and ventricles are identified compatible with brain atrophy. There is low attenuation throughout the subcortical and periventricular white matter compatible with chronic microvascular disease. Vascular: No hyperdense vessel or unexpected calcification. Skull: Normal. Negative for fracture or focal lesion. Sinuses/Orbits: No acute finding. Other: None CT CERVICAL SPINE FINDINGS Alignment: Anterolisthesis of C4 on C5 is likely related to chronic spondylosis. There is a retrolisthesis of C6 on C7. Also likely related to spondylosis. Skull base and vertebrae: No acute fracture. No primary bone lesion or focal pathologic process. Soft tissues and spinal canal: No prevertebral fluid or swelling. No visible canal hematoma. Disc levels: Multi level disc space narrowing and ventral endplate spurring is identified. This is most advanced at C5-6 and C6-C7. Upper  chest: Negative. Other: Carotid artery calcifications from aortic atherosclerosis noted. IMPRESSION: 1. No acute intracranial abnormalities. 2. Small vessel ischemic change and brain atrophy. 3. Advanced cervical spondylosis. Multi level degenerative disc disease is identified. 4. No evidence for cervical spine fracture or dislocation. Electronically Signed   By: Signa Kellaylor  Stroud M.D.   On: 01/28/2016 10:57   Dg Hips Bilat With Pelvis 2v  Result Date: 01/28/2016 CLINICAL DATA:  Fall.  Bilateral hip pain. EXAM: DG HIP (WITH OR WITHOUT PELVIS) 2V BILAT COMPARISON:  10/03/2015 bilateral hip radiographs. FINDINGS: No fracture or diastasis evident in the pelvis. No hip fracture or dislocation. No suspicious focal osseous lesion. Spondylosis in the visualized lower lumbar spine. Mild bilateral hip osteoarthritis. IMPRESSION: 1. No fracture evident in the pelvis. The nondisplaced sacrococcygeal junction fracture seen on the lumbar spine radiographs from today is not evident on these views. 2. No hip fracture or malalignment. Electronically Signed   By: Delbert PhenixJason A Poff M.D.   On: 01/28/2016 10:54        Scheduled Meds: . clopidogrel  75 mg Oral Daily  . donepezil  10 mg Oral QHS  . enoxaparin (LOVENOX) injection  40 mg Subcutaneous Q24H  .  levothyroxine  25 mcg Oral Once per day on Sun Tue Thu Sat  . [START ON 01/30/2016] levothyroxine  50 mcg Oral Once per day on Mon Wed Fri  . lisinopril  10 mg Oral Daily  . multivitamin with minerals  1 tablet Oral Daily  . QUEtiapine  50 mg Oral QHS  . vitamin C  1,000 mg Oral Daily   Continuous Infusions: . sodium chloride 75 mL/hr at 01/29/16 0437     LOS: 0 days    Time spent:    Zannie Cove, MD [facility] Pager [phone]  If 7PM-7AM, please contact night-coverage [email or web address] Password [email or web address] 01/29/2016, 11:40 AM

## 2016-01-29 NOTE — Evaluation (Signed)
Physical Therapy Evaluation Patient Details Name: Tara Hicks MRN: [id number] DOB: 1922/05/28 Today's Date: 01/29/2016   History of Present Illness   Tara Hicks is a 82 y.o. female with a past medical hx of HTN, GERD, hypothyroidism, and dementia presented with a mechanical fall.   XRAY shows nondisplaced sacrococcygeal junction fracture.  Clinical Impression  Pt is at or close to baseline functioning.  Pt will still need to be monitored at or more than previous levels.  Recommend having pt use the RW consistently. There are no further acute PT needs.  Will sign off at this time.    Follow Up Recommendations No PT follow up    Equipment Recommendations  None recommended by PT    Recommendations for Other Services       Precautions / Restrictions Precautions Precautions: Fall      Mobility  Bed Mobility Overal bed mobility: Needs Assistance Bed Mobility: Supine to Sit;Sit to Supine     Supine to sit: Supervision Sit to supine: Supervision      Transfers Overall transfer level: Needs assistance   Transfers: Sit to/from Stand Sit to Stand: Min guard            Ambulation/Gait Ambulation/Gait assistance: Min guard Ambulation Distance (Feet): 150 Feet Assistive device: None Gait Pattern/deviations: Step-through pattern   Gait velocity interpretation: at or above normal speed for age/gender General Gait Details: pt not allowing assist or guarding.  Generally steady, but loses focus within task (walking) and has unsteady episodes.  Stairs            Wheelchair Mobility    Modified Rankin (Stroke Patients Only)       Balance Overall balance assessment: Needs assistance   Sitting balance-Leahy Scale: Good       Standing balance-Leahy Scale: Fair                               Pertinent Vitals/Pain Pain Assessment: Faces Faces Pain Scale: Hurts a little bit Pain Location: sacrum Pain Descriptors / Indicators: Sore Pain  Intervention(s): Monitored during session    Home Living Family/patient expects to be discharged to:: Assisted living               Home Equipment: Walker - 2 wheels      Prior Function Level of Independence: Needs assistance   Gait / Transfers Assistance Needed: Ambulates independently in the supervised setting.  pt supposed to use RW, but forgets  ADL's / Homemaking Assistance Needed: assisted/supervised for bathing, dressing, ? toileting.        Hand Dominance        Extremity/Trunk Assessment   Upper Extremity Assessment: Overall WFL for tasks assessed           Lower Extremity Assessment: Overall WFL for tasks assessed         Communication   Communication: No difficulties  Cognition Arousal/Alertness: Awake/alert Behavior During Therapy: Restless Overall Cognitive Status: History of cognitive impairments - at baseline                      General Comments General comments (skin integrity, edema, etc.): pt takes little notice of discomfort at her sacrum.  Moves and "plops" down on the bed as if nothing is wrong until you ask her if she has discomfort.  "yes ion my tail.    Exercises     Assessment/Plan    PT Assessment Patient  needs continued PT services  PT Problem List Decreased activity tolerance;Decreased mobility;Decreased balance;Decreased knowledge of use of DME          PT Treatment Interventions      PT Goals (Current goals can be found in the Care Plan section)  Acute Rehab PT Goals PT Goal Formulation: Patient unable to participate in goal setting    Frequency     Barriers to discharge        Co-evaluation               End of Session   Activity Tolerance: Patient tolerated treatment well Patient left: in bed;with call bell/phone within reach;with nursing/sitter in room;with family/visitor present Nurse Communication: Mobility status    Functional Assessment Tool Used: clinical judgement Functional  Limitation: Mobility: Walking and moving around Mobility: Walking and Moving Around Current Status ([id number](G8978): At least 1 percent but less than 20 percent impaired, limited or restricted Mobility: Walking and Moving Around Goal Status [phone](G8979): At least 1 percent but less than 20 percent impaired, limited or restricted Mobility: Walking and Moving Around Discharge Status [phone](G8980): At least 1 percent but less than 20 percent impaired, limited or restricted    Time: 1245-1306 PT Time Calculation (min) (ACUTE ONLY): 21 min   Charges:   PT Evaluation $PT Eval Low Complexity: 1 Procedure     PT G Codes:   PT G-Codes **NOT FOR INPATIENT CLASS** Functional Assessment Tool Used: clinical judgement Functional Limitation: Mobility: Walking and moving around Mobility: Walking and Moving Around Current Status ([id number](G8978): At least 1 percent but less than 20 percent impaired, limited or restricted Mobility: Walking and Moving Around Goal Status [phone](G8979): At least 1 percent but less than 20 percent impaired, limited or restricted Mobility: Walking and Moving Around Discharge Status [phone](G8980): At least 1 percent but less than 20 percent impaired, limited or restricted    Angeldejesus Callaham, Eliseo GumKenneth V 01/29/2016, 1:20 PM 01/29/2016  Chatom BingKen Seham Gardenhire, PT [phone]  (pager)

## 2016-04-19 ENCOUNTER — Emergency Department (HOSPITAL_BASED_OUTPATIENT_CLINIC_OR_DEPARTMENT_OTHER): Payer: Medicare Other

## 2016-04-19 ENCOUNTER — Encounter (HOSPITAL_BASED_OUTPATIENT_CLINIC_OR_DEPARTMENT_OTHER): Payer: Self-pay | Admitting: *Deleted

## 2016-04-19 ENCOUNTER — Inpatient Hospital Stay (HOSPITAL_BASED_OUTPATIENT_CLINIC_OR_DEPARTMENT_OTHER)
Admission: EM | Admit: 2016-04-19 | Discharge: 2016-05-01 | DRG: 392 | Disposition: A | Discharge status: Skilled Nursing Facility | Payer: Medicare Other | Attending: Internal Medicine | Admitting: Internal Medicine

## 2016-04-19 DIAGNOSIS — E860 Dehydration: Secondary | ICD-10-CM | POA: Diagnosis present

## 2016-04-19 DIAGNOSIS — K219 Gastro-esophageal reflux disease without esophagitis: Secondary | ICD-10-CM | POA: Diagnosis present

## 2016-04-19 DIAGNOSIS — F0280 Dementia in other diseases classified elsewhere without behavioral disturbance: Secondary | ICD-10-CM | POA: Diagnosis not present

## 2016-04-19 DIAGNOSIS — Z1611 Resistance to penicillins: Secondary | ICD-10-CM | POA: Diagnosis present

## 2016-04-19 DIAGNOSIS — Z7902 Long term (current) use of antithrombotics/antiplatelets: Secondary | ICD-10-CM | POA: Diagnosis not present

## 2016-04-19 DIAGNOSIS — Z781 Physical restraint status: Secondary | ICD-10-CM | POA: Diagnosis not present

## 2016-04-19 DIAGNOSIS — R1084 Generalized abdominal pain: Secondary | ICD-10-CM | POA: Diagnosis not present

## 2016-04-19 DIAGNOSIS — Z79899 Other long term (current) drug therapy: Secondary | ICD-10-CM | POA: Diagnosis not present

## 2016-04-19 DIAGNOSIS — I10 Essential (primary) hypertension: Secondary | ICD-10-CM | POA: Diagnosis present

## 2016-04-19 DIAGNOSIS — K5792 Diverticulitis of intestine, part unspecified, without perforation or abscess without bleeding: Secondary | ICD-10-CM

## 2016-04-19 DIAGNOSIS — G308 Other Alzheimer's disease: Secondary | ICD-10-CM | POA: Diagnosis not present

## 2016-04-19 DIAGNOSIS — R1032 Left lower quadrant pain: Secondary | ICD-10-CM | POA: Diagnosis present

## 2016-04-19 DIAGNOSIS — R109 Unspecified abdominal pain: Secondary | ICD-10-CM | POA: Diagnosis present

## 2016-04-19 DIAGNOSIS — N179 Acute kidney failure, unspecified: Secondary | ICD-10-CM | POA: Diagnosis present

## 2016-04-19 DIAGNOSIS — Z8673 Personal history of transient ischemic attack (TIA), and cerebral infarction without residual deficits: Secondary | ICD-10-CM

## 2016-04-19 DIAGNOSIS — F0390 Unspecified dementia without behavioral disturbance: Secondary | ICD-10-CM | POA: Diagnosis present

## 2016-04-19 DIAGNOSIS — Z66 Do not resuscitate: Secondary | ICD-10-CM | POA: Diagnosis present

## 2016-04-19 DIAGNOSIS — K5720 Diverticulitis of large intestine with perforation and abscess without bleeding: Principal | ICD-10-CM | POA: Diagnosis present

## 2016-04-19 DIAGNOSIS — B9620 Unspecified Escherichia coli [E. coli] as the cause of diseases classified elsewhere: Secondary | ICD-10-CM | POA: Diagnosis present

## 2016-04-19 DIAGNOSIS — E039 Hypothyroidism, unspecified: Secondary | ICD-10-CM | POA: Diagnosis present

## 2016-04-19 DIAGNOSIS — K631 Perforation of intestine (nontraumatic): Secondary | ICD-10-CM | POA: Diagnosis not present

## 2016-04-19 DIAGNOSIS — R410 Disorientation, unspecified: Secondary | ICD-10-CM | POA: Diagnosis not present

## 2016-04-19 DIAGNOSIS — K529 Noninfective gastroenteritis and colitis, unspecified: Secondary | ICD-10-CM | POA: Diagnosis present

## 2016-04-19 DIAGNOSIS — N390 Urinary tract infection, site not specified: Secondary | ICD-10-CM | POA: Diagnosis present

## 2016-04-19 DIAGNOSIS — E876 Hypokalemia: Secondary | ICD-10-CM | POA: Diagnosis not present

## 2016-04-19 DIAGNOSIS — D649 Anemia, unspecified: Secondary | ICD-10-CM | POA: Diagnosis present

## 2016-04-19 LAB — CBC WITH DIFFERENTIAL/PLATELET
BASOS ABS: 0 10*3/uL (ref 0.0–0.1)
Basophils Relative: 0 %
EOS PCT: 0 %
Eosinophils Absolute: 0 10*3/uL (ref 0.0–0.7)
HCT: 38.8 % (ref 36.0–46.0)
Hemoglobin: 12.4 g/dL (ref 12.0–15.0)
LYMPHS PCT: 18 %
Lymphs Abs: 1.4 10*3/uL (ref 0.7–4.0)
MCH: 26.3 pg (ref 26.0–34.0)
MCHC: 32 g/dL (ref 30.0–36.0)
MCV: 82.2 fL (ref 78.0–100.0)
Monocytes Absolute: 0.8 10*3/uL (ref 0.1–1.0)
Monocytes Relative: 11 %
NEUTROS PCT: 71 %
Neutro Abs: 5.5 10*3/uL (ref 1.7–7.7)
PLATELETS: 233 10*3/uL (ref 150–400)
RBC: 4.72 MIL/uL (ref 3.87–5.11)
RDW: 13.7 % (ref 11.5–15.5)
WBC: 7.7 10*3/uL (ref 4.0–10.5)

## 2016-04-19 LAB — COMPREHENSIVE METABOLIC PANEL
ALT: 14 U/L (ref 14–54)
AST: 18 U/L (ref 15–41)
Albumin: 4 g/dL (ref 3.5–5.0)
Alkaline Phosphatase: 74 U/L (ref 38–126)
Anion gap: 10 (ref 5–15)
BUN: 21 mg/dL — AB (ref 6–20)
CHLORIDE: 101 mmol/L (ref 101–111)
CO2: 26 mmol/L (ref 22–32)
CREATININE: 0.9 mg/dL (ref 0.44–1.00)
Calcium: 9.9 mg/dL (ref 8.9–10.3)
GFR calc Af Amer: >60 mL/min (ref >60)
GFR, EST NON AFRICAN AMERICAN: 54 mL/min — AB (ref >60)
Glucose, Bld: 109 mg/dL — ABNORMAL HIGH (ref 65–99)
Potassium: 4.6 mmol/L (ref 3.5–5.1)
SODIUM: 137 mmol/L (ref 135–145)
Total Bilirubin: 1 mg/dL (ref 0.3–1.2)
Total Protein: 6.9 g/dL (ref 6.5–8.1)

## 2016-04-19 LAB — URINALYSIS, ROUTINE W REFLEX MICROSCOPIC
Bilirubin Urine: NEGATIVE
GLUCOSE, UA: NEGATIVE mg/dL
Ketones, ur: NEGATIVE mg/dL
Nitrite: POSITIVE — AB
PH: 7.5 (ref 5.0–8.0)
Protein, ur: NEGATIVE mg/dL
SPECIFIC GRAVITY, URINE: 1.02 (ref 1.005–1.030)

## 2016-04-19 LAB — URINALYSIS, MICROSCOPIC (REFLEX)

## 2016-04-19 LAB — CBG MONITORING, ED: Glucose-Capillary: 134 mg/dL — ABNORMAL HIGH (ref 65–99)

## 2016-04-19 LAB — LIPASE, BLOOD: Lipase: 10 U/L — ABNORMAL LOW (ref 11–51)

## 2016-04-19 MED ORDER — HEPARIN SODIUM (PORCINE) 5000 UNIT/ML IJ SOLN
5000.0000 [IU] | Freq: Three times a day (TID) | INTRAMUSCULAR | Status: DC
  Administered 2016-04-19 – 2016-04-25 (×17): 5000 [IU] via SUBCUTANEOUS
  Filled 2016-04-19 (×14): qty 1

## 2016-04-19 MED ORDER — LEVOTHYROXINE SODIUM 50 MCG PO TABS: 50.0000 ug | ORAL_TABLET | ORAL | Status: DC

## 2016-04-19 MED ORDER — LORAZEPAM 2 MG/ML IJ SOLN: 0.5000 mg | Freq: Four times a day (QID) | INTRAMUSCULAR | Status: DC | PRN

## 2016-04-19 MED ORDER — ACETAMINOPHEN 650 MG RE SUPP: 650.0000 mg | Freq: Four times a day (QID) | RECTAL | Status: DC | PRN

## 2016-04-19 MED ORDER — ACETAMINOPHEN 325 MG PO TABS
650.0000 mg | ORAL_TABLET | Freq: Four times a day (QID) | ORAL | Status: DC | PRN
  Administered 2016-04-25 – 2016-05-01 (×4): 650 mg via ORAL
  Filled 2016-04-19 (×4): qty 2

## 2016-04-19 MED ORDER — HYDRALAZINE HCL 10 MG PO TABS
10.0000 mg | ORAL_TABLET | Freq: Three times a day (TID) | ORAL | Status: DC | PRN
  Administered 2016-04-23 (×2): 10 mg via ORAL
  Filled 2016-04-19 (×2): qty 1

## 2016-04-19 MED ORDER — CIPROFLOXACIN HCL 500 MG PO TABS
500.0000 mg | ORAL_TABLET | Freq: Once | ORAL | Status: AC
  Administered 2016-04-19: 500 mg via ORAL
  Filled 2016-04-19: qty 1

## 2016-04-19 MED ORDER — SODIUM CHLORIDE 0.9 % IV SOLN
INTRAVENOUS | Status: AC
  Administered 2016-04-20: via INTRAVENOUS

## 2016-04-19 MED ORDER — LISINOPRIL 10 MG PO TABS
10.0000 mg | ORAL_TABLET | Freq: Every day | ORAL | Status: DC
  Administered 2016-04-20 (×2): 10 mg via ORAL
  Filled 2016-04-19 (×2): qty 1

## 2016-04-19 MED ORDER — LORAZEPAM 2 MG/ML IJ SOLN
0.5000 mg | INTRAMUSCULAR | Status: DC | PRN
  Administered 2016-04-19: 0.5 mg via INTRAVENOUS
  Filled 2016-04-19: qty 1

## 2016-04-19 MED ORDER — QUETIAPINE FUMARATE 25 MG PO TABS
12.5000 mg | ORAL_TABLET | Freq: Every day | ORAL | Status: DC
  Filled 2016-04-19: qty 1

## 2016-04-19 MED ORDER — CLOPIDOGREL BISULFATE 75 MG PO TABS
75.0000 mg | ORAL_TABLET | Freq: Every day | ORAL | Status: DC
  Administered 2016-04-20: 75 mg via ORAL
  Filled 2016-04-19: qty 1

## 2016-04-19 MED ORDER — CIPROFLOXACIN IN D5W 400 MG/200ML IV SOLN
400.0000 mg | Freq: Two times a day (BID) | INTRAVENOUS | Status: DC
  Administered 2016-04-20: 400 mg via INTRAVENOUS
  Filled 2016-04-19: qty 200

## 2016-04-19 MED ORDER — MORPHINE SULFATE (PF) 4 MG/ML IV SOLN
4.0000 mg | INTRAVENOUS | Status: DC | PRN
  Administered 2016-04-19: 4 mg via INTRAVENOUS
  Filled 2016-04-19: qty 1

## 2016-04-19 MED ORDER — IOPAMIDOL (ISOVUE-300) INJECTION 61%
100.0000 mL | Freq: Once | INTRAVENOUS | Status: AC | PRN
  Administered 2016-04-19: 100 mL via INTRAVENOUS

## 2016-04-19 MED ORDER — METRONIDAZOLE IN NACL 5-0.79 MG/ML-% IV SOLN
500.0000 mg | Freq: Once | INTRAVENOUS | Status: AC
  Administered 2016-04-19: 500 mg via INTRAVENOUS
  Filled 2016-04-19: qty 100

## 2016-04-19 MED ORDER — OXYCODONE HCL 5 MG PO TABS: 2.5000 mg | ORAL_TABLET | ORAL | Status: DC | PRN

## 2016-04-19 MED ORDER — MORPHINE SULFATE (PF) 2 MG/ML IV SOLN
2.0000 mg | INTRAVENOUS | Status: DC | PRN
  Administered 2016-04-25 – 2016-04-26 (×3): 2 mg via INTRAVENOUS
  Filled 2016-04-19 (×3): qty 1

## 2016-04-19 MED ORDER — QUETIAPINE FUMARATE 25 MG PO TABS
25.0000 mg | ORAL_TABLET | Freq: Every day | ORAL | Status: DC
  Administered 2016-04-19: 25 mg via ORAL

## 2016-04-19 MED ORDER — ONDANSETRON HCL 4 MG/2ML IJ SOLN
4.0000 mg | Freq: Once | INTRAMUSCULAR | Status: AC
  Administered 2016-04-19: 4 mg via INTRAVENOUS
  Filled 2016-04-19: qty 2

## 2016-04-19 MED ORDER — METRONIDAZOLE IN NACL 5-0.79 MG/ML-% IV SOLN
500.0000 mg | Freq: Three times a day (TID) | INTRAVENOUS | Status: DC
  Administered 2016-04-20: 500 mg via INTRAVENOUS
  Filled 2016-04-19: qty 100

## 2016-04-19 NOTE — ED Notes (Signed)
Patient transported to CT 

## 2016-04-19 NOTE — Progress Notes (Signed)
82 year old female past medical history of TIA, low thyroid, hypertension, dementia presented to the ED with chief complaint of abdominal pain. Limited history due to dementia and inability of emergency room provider to get up with family. Patient's vital signs been stable in the emergency room. CT scan showed bowel thickening with perforation concerning for malignancy. Patient come in to telemetry bed for observation. Will request lactic acid. Cipro and flagyl given. General surgery notified and will see pt on arrival.  Haydee SalterPhillip M Hobbs MD

## 2016-04-19 NOTE — ED Triage Notes (Signed)
[facility] -- reports abd pain since yesterday. Denies n/v/d, fever. Unsure of when last BM was. Denies genitourinary symptoms.

## 2016-04-19 NOTE — ED Notes (Signed)
[facility] x[facility]

## 2016-04-19 NOTE — ED Notes (Signed)
Pt was 90% on RA, pt placed on 2L [place] 02 and now 96%.

## 2016-04-19 NOTE — Consult Note (Signed)
Reason for Consult: Left lower quadrant abdominal pain Referring Physician: Dr. Carmon Ginsberg  Tara Hicks is an 82 y.o. female.  HPI: This is a 82 year old female with a history of dementia who is in a nursing facility. She was transferred from her facility to [facility] for a one-day history of left-sided abdominal pain.  There was no nausea or vomiting. It is uncertain when she has had her last bowel movement. She is found on CT scan to have thickening of the descending colon with one area of air that appeared outside of the bowel. There was inflammatory changes. There is no lymphadenopathy. Circumferential thickening was found an area approximately 3.5 cm in length. Currently, the patient is a poor historian given her dementia but is awake and alert. She reports left lower quadrant abdominal pain. She reports no prior history of similar discomfort. She describes the pain is sharp, moderate intensity, and fairly constant.  She is uncertain of her last colonoscopy. There is currently no family here. All other history is been obtained from the chart  Past Medical History:  Diagnosis Date  . Arthritis   . Cataract   . Complication of anesthesia   . Dementia   . GERD (gastroesophageal reflux disease)   . Hypertension   . Hypothyroidism   . Leaky heart valve    Hx: of  . PONV (postoperative nausea and vomiting)   . TIA (transient ischemic attack)    Hx: of 2014    Past Surgical History:  Procedure Laterality Date  . BREAST SURGERY     Hx: of left breast cyst removal  . CATARACT EXTRACTION    . COLONOSCOPY     Hx: of  . DILATION AND CURETTAGE OF UTERUS    . LUMBAR LAMINECTOMY/DECOMPRESSION MICRODISCECTOMY Left 01/31/2013   Procedure: Left lumbar five-sacral one laminectomy for synovial cyst;  Surgeon: Faythe Ghee, MD;  Location: [facility];  Service: Neurosurgery;  Laterality: Left;    Family History  Problem Relation Age of Onset  . Diabetes Brother   . Hypertension  Brother   . Cancer - Colon Sister     Social History:  reports that she has never smoked. She has never used smokeless tobacco. She reports that she does not drink alcohol or use drugs.  Allergies:  Allergies  Allergen Reactions  . Phenergan [Promethazine] Other (See Comments)    hyperactive    Medications: I have reviewed the patient's current medications.  Results for orders placed or performed during the hospital encounter of 04/19/16 (from the past 48 hour(s))  CBC with Differential/Platelet     Status: None   Collection Time: 04/19/16 12:35 PM  Result Value Ref Range   WBC 7.7 4.0 - 10.5 K/uL   RBC 4.72 3.87 - 5.11 MIL/uL   Hemoglobin 12.4 12.0 - 15.0 g/dL   HCT 38.8 36.0 - 46.0 %   MCV 82.2 78.0 - 100.0 fL   MCH 26.3 26.0 - 34.0 pg   MCHC 32.0 30.0 - 36.0 g/dL   RDW 13.7 11.5 - 15.5 %   Platelets 233 150 - 400 K/uL   Neutrophils Relative % 71 %   Neutro Abs 5.5 1.7 - 7.7 K/uL   Lymphocytes Relative 18 %   Lymphs Abs 1.4 0.7 - 4.0 K/uL   Monocytes Relative 11 %   Monocytes Absolute 0.8 0.1 - 1.0 K/uL   Eosinophils Relative 0 %   Eosinophils Absolute 0.0 0.0 - 0.7 K/uL   Basophils Relative 0 %  Basophils Absolute 0.0 0.0 - 0.1 K/uL  Comprehensive metabolic panel     Status: Abnormal   Collection Time: 04/19/16 12:35 PM  Result Value Ref Range   Sodium 137 135 - 145 mmol/L   Potassium 4.6 3.5 - 5.1 mmol/L   Chloride 101 101 - 111 mmol/L   CO2 26 22 - 32 mmol/L   Glucose, Bld 109 (H) 65 - 99 mg/dL   BUN 21 (H) 6 - 20 mg/dL   Creatinine, Ser 0.90 0.44 - 1.00 mg/dL   Calcium 9.9 8.9 - 10.3 mg/dL   Total Protein 6.9 6.5 - 8.1 g/dL   Albumin 4.0 3.5 - 5.0 g/dL   AST 18 15 - 41 U/L   ALT 14 14 - 54 U/L   Alkaline Phosphatase 74 38 - 126 U/L   Total Bilirubin 1.0 0.3 - 1.2 mg/dL   GFR calc non Af Amer 54 (L) >60 mL/min   GFR calc Af Amer >60 >60 mL/min    Comment: (NOTE) The eGFR has been calculated using the CKD EPI equation. This calculation has not been  validated in all clinical situations. eGFR's persistently <60 mL/min signify possible Chronic Kidney Disease.    Anion gap 10 5 - 15  Lipase, blood     Status: Abnormal   Collection Time: 04/19/16 12:35 PM  Result Value Ref Range   Lipase 10 (L) 11 - 51 U/L  Urinalysis, Routine w reflex microscopic     Status: Abnormal   Collection Time: 04/19/16 12:59 PM  Result Value Ref Range   Color, Urine YELLOW YELLOW   APPearance CLOUDY (A) CLEAR   Specific Gravity, Urine 1.020 1.005 - 1.030   pH 7.5 5.0 - 8.0   Glucose, UA NEGATIVE NEGATIVE mg/dL   Hgb urine dipstick SMALL (A) NEGATIVE   Bilirubin Urine NEGATIVE NEGATIVE   Ketones, ur NEGATIVE NEGATIVE mg/dL   Protein, ur NEGATIVE NEGATIVE mg/dL   Nitrite POSITIVE (A) NEGATIVE   Leukocytes, UA MODERATE (A) NEGATIVE  Urinalysis, Microscopic (reflex)     Status: Abnormal   Collection Time: 04/19/16 12:59 PM  Result Value Ref Range   RBC / HPF 0-5 0 - 5 RBC/hpf   WBC, UA 6-30 0 - 5 WBC/hpf   Bacteria, UA MANY (A) NONE SEEN   Squamous Epithelial / LPF 6-30 (A) NONE SEEN   Mucous PRESENT   CBG monitoring, ED     Status: Abnormal   Collection Time: 04/19/16  4:52 PM  Result Value Ref Range   Glucose-Capillary 134 (H) 65 - 99 mg/dL    Ct Abdomen Pelvis W Contrast  Result Date: 04/19/2016 CLINICAL DATA:  Bilateral lower quadrant tenderness for 1 day EXAM: CT ABDOMEN AND PELVIS WITH CONTRAST TECHNIQUE: Multidetector CT imaging of the abdomen and pelvis was performed using the standard protocol following bolus administration of intravenous contrast. CONTRAST:  18m ISOVUE-300 IOPAMIDOL (ISOVUE-300) INJECTION 61% COMPARISON:  May 27, 2009 FINDINGS: Lower chest: No acute abnormality. Hepatobiliary: There is diffuse low density of liver without vessel displacement. No focal liver lesion is identified. There are gallstones within the gallbladder. The common bowel duct measures 7 mm at the pancreatic head upper limits are normal for patient age.  Pancreas: Unremarkable. No pancreatic ductal dilatation or surrounding inflammatory changes. Spleen: Normal in size without focal abnormality. Adrenals/Urinary Tract: Adrenal glands are unremarkable. Kidneys are normal, without renal calculi, focal lesion, or hydronephrosis. Bladder is unremarkable. Stomach/Bowel: There are abnormal thick wall small bowel loops in the pelvis. There is 3  cm abnormal circumferential the wall of the distal descending colon. There is a 4 mm focus air which may be extraluminal adjacent to the abnormal thick wall distal descending colon. There is stranding inflammation surrounding the descending colon more prominently in the distal descending colon. There is a large hiatal hernia. Vascular/Lymphatic: Aortic atherosclerosis. No enlarged abdominal or pelvic lymph nodes. Reproductive: Uterus and bilateral adnexa are unremarkable. Other: Small amount free fluid is identified in the pelvis. Musculoskeletal: Degenerative joint changes of the spine are noted. IMPRESSION: 3 cm length of abnormal circumferential thick walled distal descending colon with adjacent focus air which may be extraluminal. Findings could be due to for colonic neoplasm with focal perforation. There are abnormal thick wall small bowel loops in the pelvis which may be inflammatory. These results will be called to the ordering clinician or representative by the Radiologist Assistant, and communication documented in the PACS or zVision Dashboard. Electronically Signed   By: Abelardo Diesel M.D.   On: 04/19/2016 14:13    Review of Systems  Unable to perform ROS: Dementia   Blood pressure (!) 173/85, pulse 90, temperature 99.3 F (37.4 C), resp. rate 18, height 5' 4" (1.626 m), weight 52.5 kg (115 lb 11.2 oz), SpO2 94 %. Physical Exam  Constitutional: She is oriented to person, place, and time. She appears well-developed and well-nourished. No distress.  HENT:  Head: Normocephalic and atraumatic.  Right Ear: External  ear normal.  Left Ear: External ear normal.  Nose: Nose normal.  Mouth/Throat: Oropharynx is clear and moist. No oropharyngeal exudate.  Eyes: Conjunctivae are normal. Pupils are equal, round, and reactive to light. Right eye exhibits no discharge. Left eye exhibits no discharge. No scleral icterus.  Neck: Normal range of motion. No tracheal deviation present.  Cardiovascular: Normal rate, regular rhythm, normal heart sounds and intact distal pulses.   No murmur heard. Respiratory: Effort normal and breath sounds normal. No respiratory distress. She has no wheezes.  GI: Soft. She exhibits no distension. There is tenderness. There is guarding.  There is tenderness with guarding in the left lower quadrant. The rest of the abdomen is soft and nontender  Musculoskeletal: Normal range of motion. She exhibits no edema or tenderness.  Lymphadenopathy:    She has no cervical adenopathy.  Neurological: She is alert and oriented to person, place, and time.  Skin: Skin is warm. No rash noted. She is not diaphoretic. No erythema.  Psychiatric: Her behavior is normal. Judgment normal.    Assessment/Plan: Sigmoid diverticulitis with microperforation  I would favor diverticulitis more than malignancy at this point as there is no specific mass or adenopathy although this cannot be entirely excluded. She currently does not need acute surgical intervention. We would like to try a course of conservative management with bowel rest and IV antibiotics to see if this will improve without the need for surgery. If she does improve, an outpatient lower endoscopy would need to be considered as a follow-up. We will follow her closely with you. Currently she is on bowel rest and IV antibiotics.  , A 04/19/2016, 8:42 PM

## 2016-04-19 NOTE — H&P (Signed)
History and Physical    Tara CopaMatilda Brucker ZOX:[id number] DOB: Jan 27, 1923 DOA: 04/19/2016  PCP: Florentina JennyRIPP, HENRY, MD  [facility]  Chief Complaint: abdominal pain  HPI: Tara Hicks is a 82 y.o. female present with abdominal pain.  Patient has hx of dementia and not the most reliable historian.  As per ER ntoe the pain started on 12/29, unknown last Bm.  No chest pain, palpitations, sob, cough, hemoptysis, nausea, vomiting, fever, chills, abdominal pain, dysuria, hematuria, polyuria, nocturia, BRBPR, melena, hematochezia, heat/cold intolerance, orthopnea, PND, or leg swelling Patient reports "pain everywhere but right now I am ok", "my belly has been hurting for some time now ... I am not sure for how long though." I called and spoke to Electa Sniffim Watkin her son.  He was not aware of any abdominal pain report.  This is the first he is hearing of the complaint.    ED Course: Ct scan with perforation, iv flagyl/cipro  Review of Systems: As per HPI otherwise 10 point review of systems negative. Patient is not reliable based on history  Past Medical History:  Diagnosis Date  . Arthritis   . Cataract   . Complication of anesthesia   . Dementia   . GERD (gastroesophageal reflux disease)   . Hypertension   . Hypothyroidism   . Leaky heart valve    Hx: of  . PONV (postoperative nausea and vomiting)   . TIA (transient ischemic attack)    Hx: of 2014    Past Surgical History:  Procedure Laterality Date  . BREAST SURGERY     Hx: of left breast cyst removal  . CATARACT EXTRACTION    . COLONOSCOPY     Hx: of  . DILATION AND CURETTAGE OF UTERUS    . LUMBAR LAMINECTOMY/DECOMPRESSION MICRODISCECTOMY Left 01/31/2013   Procedure: Left lumbar five-sacral one laminectomy for synovial cyst;  Surgeon: Reinaldo Meekerandy O Kritzer, MD;  Location: [facility];  Service: Neurosurgery;  Laterality: Left;     reports that she has never smoked. She has never used smokeless tobacco. She reports that she does not  drink alcohol or use drugs.  Allergies  Allergen Reactions  . Phenergan [Promethazine] Other (See Comments)    hyperactive    Family History  Problem Relation Age of Onset  . Diabetes Brother   . Hypertension Brother   . Cancer - Colon Sister    Family hx reviewed  Prior to Admission medications   Medication Sig Start Date End Date Taking? Authorizing Provider  acetaminophen (TYLENOL) 325 MG tablet Take 2 tablets (650 mg total) by mouth every 6 (six) hours as needed for mild pain (or Fever >/= 101). 01/29/16  Yes Zannie CovePreetha Joseph, MD  clopidogrel (PLAVIX) 75 MG tablet Take 75 mg by mouth daily.   Yes Historical Provider, MD  donepezil (ARICEPT) 10 MG tablet Take 10 mg by mouth at bedtime.   Yes Historical Provider, MD  guaifenesin (ROBAFEN) 100 MG/5ML syrup Take 100 mg by mouth 4 (four) times daily as needed for cough.   Yes Historical Provider, MD  levothyroxine (SYNTHROID, LEVOTHROID) 50 MCG tablet Take 50 mcg by mouth 3 (three) times a week. Take on Mon, Wed, Fri   Yes Historical Provider, MD  Magnesium Hydroxide (MILK OF MAGNESIA PO) Take by mouth.   Yes Historical Provider, MD  Melatonin 5 MG TABS Take by mouth at bedtime.   Yes Historical Provider, MD  methocarbamol (ROBAXIN) 500 MG tablet Take 500 mg by mouth 4 (four) times daily.  Yes Historical Provider, MD  ondansetron (ZOFRAN) 4 MG tablet Take 4 mg by mouth every 8 (eight) hours as needed for nausea.   Yes Historical Provider, MD  QUEtiapine (SEROQUEL) 25 MG tablet Take 12.5 mg by mouth at bedtime.   Yes Historical Provider, MD  vitamin B-12 (CYANOCOBALAMIN) 500 MCG tablet Take 500 mcg by mouth daily.   Yes Historical Provider, MD  Ascorbic Acid (VITAMIN C) 1000 MG tablet Take 1,000 mg by mouth daily.    Historical Provider, MD  diazepam (VALIUM) 2 MG tablet Take 2 mg by mouth every 6 (six) hours as needed for anxiety.    Historical Provider, MD  guaiFENesin-codeine (ROBITUSSIN AC) 100-10 MG/5ML syrup Take 5 mLs by mouth 3  (three) times daily as needed for cough.    Historical Provider, MD  HYDROcodone-acetaminophen (NORCO/VICODIN) 5-325 MG tablet Take 1 tablet by mouth every 6 (six) hours as needed for moderate pain. 01/29/16   Zannie Cove, MD  lisinopril (PRINIVIL,ZESTRIL) 10 MG tablet Take 10 mg by mouth daily.    Historical Provider, MD  Multiple Vitamin (MULTIVITAMIN WITH MINERALS) TABS tablet Take 1 tablet by mouth daily.    Historical Provider, MD  QUEtiapine (SEROQUEL) 50 MG tablet Take 50 mg by mouth at bedtime.    Historical Provider, MD    Physical Exam: Vitals:   04/19/16 1715 04/19/16 1727 04/19/16 1736 04/19/16 1838  BP:   149/83 (!) 173/85  Pulse: 89  91 90  Resp: 21  22 18   Temp:  99.3 F (37.4 C) 99.3 F (37.4 C)   TempSrc:  Oral    SpO2: 99%  99% 94%  Weight:    52.5 kg (115 lb 11.2 oz)  Height:    5\' 4"  (1.626 m)      Constitutional: NAD, calm, comfortable Vitals:   04/19/16 1715 04/19/16 1727 04/19/16 1736 04/19/16 1838  BP:   149/83 (!) 173/85  Pulse: 89  91 90  Resp: 21  22 18   Temp:  99.3 F (37.4 C) 99.3 F (37.4 C)   TempSrc:  Oral    SpO2: 99%  99% 94%  Weight:    52.5 kg (115 lb 11.2 oz)  Height:    5\' 4"  (1.626 m)   Eyes: PERRL, lids and conjunctivae normal, anicteric ENMT: Mucous membranes are moist. Posterior pharynx clear of any exudate or lesions.Normal dentition.  Neck: normal, supple, no masses, no thyromegaly Respiratory: clear to auscultation bilaterally, no wheezing, no crackles. Normal respiratory effort. No accessory muscle use.  Cardiovascular: Regular rate and rhythm, no murmurs / rubs / gallops. No extremity edema. 2+ pedal pulses. No carotid bruits.  Abdomen: + bowel sounds but decreased, + TTP in LLQ,  no masses palpated. No hepatosplenomegaly. No guarding, rigidity, rebound or distention Musculoskeletal: no clubbing / cyanosis. No joint deformity upper and lower extremities. Good ROM, no contractures. Normal muscle tone.  Skin: no rashes,  lesions, ulcers. No induration Neurologic: CN 2-12 grossly intact. Sensation intact, non-focal exam. Strength 5/5 in all 4.  Psychiatric: Normal judgment and insight. Alert and oriented x 1. Normal mood.  Capillary refill less than 2 seconds   Labs on Admission: I have personally reviewed following labs and imaging studies  CBC:  Recent Labs Lab 04/19/16 1235  WBC 7.7  NEUTROABS 5.5  HGB 12.4  HCT 38.8  MCV 82.2  PLT 233   Basic Metabolic Panel:  Recent Labs Lab 04/19/16 1235  NA 137  K 4.6  CL 101  CO2 26  GLUCOSE 109*  BUN 21*  CREATININE 0.90  CALCIUM 9.9   GFR: Estimated Creatinine Clearance: 33.7 mL/min (by C-G formula based on SCr of 0.9 mg/dL). Liver Function Tests:  Recent Labs Lab 04/19/16 1235  AST 18  ALT 14  ALKPHOS 74  BILITOT 1.0  PROT 6.9  ALBUMIN 4.0    Recent Labs Lab 04/19/16 1235  LIPASE 10*   No results for input(s): AMMONIA in the last 168 hours. Coagulation Profile: No results for input(s): INR, PROTIME in the last 168 hours. Cardiac Enzymes: No results for input(s): CKTOTAL, CKMB, CKMBINDEX, TROPONINI in the last 168 hours. BNP (last 3 results) No results for input(s): PROBNP in the last 8760 hours. HbA1C: No results for input(s): HGBA1C in the last 72 hours. CBG:  Recent Labs Lab 04/19/16 1652  GLUCAP 134*   Lipid Profile: No results for input(s): CHOL, HDL, LDLCALC, TRIG, CHOLHDL, LDLDIRECT in the last 72 hours. Thyroid Function Tests: No results for input(s): TSH, T4TOTAL, FREET4, T3FREE, THYROIDAB in the last 72 hours. Anemia Panel: No results for input(s): VITAMINB12, FOLATE, FERRITIN, TIBC, IRON, RETICCTPCT in the last 72 hours. Urine analysis:    Component Value Date/Time   COLORURINE YELLOW 04/19/2016 1259   APPEARANCEUR CLOUDY (A) 04/19/2016 1259   LABSPEC 1.020 04/19/2016 1259   PHURINE 7.5 04/19/2016 1259   GLUCOSEU NEGATIVE 04/19/2016 1259   HGBUR SMALL (A) 04/19/2016 1259   BILIRUBINUR NEGATIVE  04/19/2016 1259   KETONESUR NEGATIVE 04/19/2016 1259   PROTEINUR NEGATIVE 04/19/2016 1259   UROBILINOGEN 1.0 07/08/2014 1132   NITRITE POSITIVE (A) 04/19/2016 1259   LEUKOCYTESUR MODERATE (A) 04/19/2016 1259    Sepsis Labs: No results found for this or any previous visit (from the past 240 hour(s)).   Radiological Exams on Admission: Ct Abdomen Pelvis W Contrast  Result Date: 04/19/2016 CLINICAL DATA:  Bilateral lower quadrant tenderness for 1 day EXAM: CT ABDOMEN AND PELVIS WITH CONTRAST TECHNIQUE: Multidetector CT imaging of the abdomen and pelvis was performed using the standard protocol following bolus administration of intravenous contrast. CONTRAST:  ISOVUE-300 IOPAMIDOL (ISOVUE-300) INJECTION 61% COMPARISON:  May 27, 2009 FINDINGS: Lower chest: No acute abnormality. Hepatobiliary: There is diffuse low density of liver without vessel displacement. No focal liver lesion is identified. There are gallstones within the gallbladder. The common bowel duct measures 7 mm at the pancreatic head upper limits are normal for patient age. Pancreas: Unremarkable. No pancreatic ductal dilatation or surrounding inflammatory changes. Spleen: Normal in size without focal abnormality. Adrenals/Urinary Tract: Adrenal glands are unremarkable. Kidneys are normal, without renal calculi, focal lesion, or hydronephrosis. Bladder is unremarkable. Stomach/Bowel: There are abnormal thick wall small bowel loops in the pelvis. There is 3 cm abnormal circumferential the wall of the distal descending colon. There is a 4 mm focus air which may be extraluminal adjacent to the abnormal thick wall distal descending colon. There is stranding inflammation surrounding the descending colon more prominently in the distal descending colon. There is a large hiatal hernia. Vascular/Lymphatic: Aortic atherosclerosis. No enlarged abdominal or pelvic lymph nodes. Reproductive: Uterus and bilateral adnexa are unremarkable. Other:  Small amount free fluid is identified in the pelvis. Musculoskeletal: Degenerative joint changes of the spine are noted. IMPRESSION: 3 cm length of abnormal circumferential thick walled distal descending colon with adjacent focus air which may be extraluminal. Findings could be due to for colonic neoplasm with focal perforation. There are abnormal thick wall small bowel loops in the pelvis which may be inflammatory. These results will be  called to the ordering clinician or representative by the Radiologist Assistant, and communication documented in the PACS or zVision Dashboard. Electronically Signed   By: Sherian ReinWei-Chen  Lin M.D.   On: 04/19/2016 14:13    EKG: by my independent review. NSR 68, no Qw, TWI or ST changes  Assessment/Plan Active Problems:   Abdominal pain    UTI: iv cipro, check cultures, monitor, adjust based on result.    Bowel perforation and colonic mass: NPO, broaden coverage with flagyl iv, ck cultures.  GenSurg Dr Magnus IvanBlackman was contacted.  He will follow case and enter his recommendations.  PRN pain meds  HTN: continue meds, please verifiy as not verified. monitor, optimize care and Rx.  PRN entered.    TIA: prior hx, nothing acute at this time.  Continue with plavix  Dementia: appears stable, continue meds, monitor.  Med list was not sent over.  Will need all meds to be reconciled/verified during daytime hours.  Asked floor RN to contact facility for advance directive and med list.  Hypothyroidism: monitor, ck labs, continue med  DVT prophylaxis: HSQ  Code Status: DNR  Family Communication: Electa Sniffim Fuertes, son discussed care and Rx plan.  Advance directive was not sent from facility.  Tim verified his mother did not want to be on a machine to keep her alive.  DNR code status. Disposition Plan: Assisted Living Facility  Consults called: GenSurg  Admission status: 2 midnights    Kerrin Moobin Zamira Hickam MD [facility] Pager [phone]  If 7PM-7AM, please contact  night-coverage [email or web address] Password [facility]  04/19/2016, 7:39 PM

## 2016-04-19 NOTE — ED Notes (Signed)
Attempted to call [facility] to find other family member's phone numbers.  No answer when calling, will try again.

## 2016-04-19 NOTE — ED Notes (Signed)
Pt placed on bedpan to urinate with scant amount of urine in pan after finishing.  Pt cleaned and diaper placed on pt.

## 2016-04-19 NOTE — ED Provider Notes (Signed)
[facility]   CSN: [id number] Arrival date & time: 04/19/16  1155     History   Chief Complaint Chief Complaint  Patient presents with  . Abdominal Pain    HPI Tara Hicks is a 82 y.o. female. She has history of dementia. She resides at [facility]. Transferred today for evaluation of abdominal pain.  HPI brought in by EMS. Facility reports abdominal pain starting last night. Unknown last bowel movement. No nausea vomiting diarrhea. No fever this morning and vitals sign check.  No additional information is available from the patient.  Past Medical History:  Diagnosis Date  . Arthritis   . Cataract   . Complication of anesthesia   . Dementia   . GERD (gastroesophageal reflux disease)   . Hypertension   . Hypothyroidism   . Leaky heart valve    Hx: of  . PONV (postoperative nausea and vomiting)   . TIA (transient ischemic attack)    Hx: of 2014    Patient Active Problem List   Diagnosis Date Noted  . Sacral fracture, closed (HCC) 01/28/2016  . Dementia 01/28/2016  . Hypothyroidism 01/28/2016  . Essential hypertension 01/28/2016    Past Surgical History:  Procedure Laterality Date  . BREAST SURGERY     Hx: of left breast cyst removal  . CATARACT EXTRACTION    . COLONOSCOPY     Hx: of  . DILATION AND CURETTAGE OF UTERUS    . LUMBAR LAMINECTOMY/DECOMPRESSION MICRODISCECTOMY Left 01/31/2013   Procedure: Left lumbar five-sacral one laminectomy for synovial cyst;  Surgeon: Reinaldo Meeker, MD;  Location: [facility];  Service: Neurosurgery;  Laterality: Left;    OB History    No data available       Home Medications    Prior to Admission medications   Medication Sig Start Date End Date Taking? Authorizing Provider  acetaminophen (TYLENOL) 325 MG tablet Take 2 tablets (650 mg total) by mouth every 6 (six) hours as needed for mild pain (or Fever >/= 101). 01/29/16  Yes Zannie Cove, MD  clopidogrel (PLAVIX) 75 MG  tablet Take 75 mg by mouth daily.   Yes Historical Provider, MD  donepezil (ARICEPT) 10 MG tablet Take 10 mg by mouth at bedtime.   Yes Historical Provider, MD  guaifenesin (ROBAFEN) 100 MG/5ML syrup Take 100 mg by mouth 4 (four) times daily as needed for cough.   Yes Historical Provider, MD  levothyroxine (SYNTHROID, LEVOTHROID) 50 MCG tablet Take 50 mcg by mouth 3 (three) times a week. Take on Mon, Wed, Fri   Yes Historical Provider, MD  Magnesium Hydroxide (MILK OF MAGNESIA PO) Take by mouth.   Yes Historical Provider, MD  Melatonin 5 MG TABS Take by mouth at bedtime.   Yes Historical Provider, MD  methocarbamol (ROBAXIN) 500 MG tablet Take 500 mg by mouth 4 (four) times daily.   Yes Historical Provider, MD  ondansetron (ZOFRAN) 4 MG tablet Take 4 mg by mouth every 8 (eight) hours as needed for nausea.   Yes Historical Provider, MD  QUEtiapine (SEROQUEL) 25 MG tablet Take 12.5 mg by mouth at bedtime.   Yes Historical Provider, MD  vitamin B-12 (CYANOCOBALAMIN) 500 MCG tablet Take 500 mcg by mouth daily.   Yes Historical Provider, MD  Ascorbic Acid (VITAMIN C) 1000 MG tablet Take 1,000 mg by mouth daily.    Historical Provider, MD  diazepam (VALIUM) 2 MG tablet Take 2 mg by mouth every 6 (six) hours as  needed for anxiety.    Historical Provider, MD  guaiFENesin-codeine (ROBITUSSIN AC) 100-10 MG/5ML syrup Take 5 mLs by mouth 3 (three) times daily as needed for cough.    Historical Provider, MD  HYDROcodone-acetaminophen (NORCO/VICODIN) 5-325 MG tablet Take 1 tablet by mouth every 6 (six) hours as needed for moderate pain. 01/29/16   Zannie CovePreetha Joseph, MD  lisinopril (PRINIVIL,ZESTRIL) 10 MG tablet Take 10 mg by mouth daily.    Historical Provider, MD  Multiple Vitamin (MULTIVITAMIN WITH MINERALS) TABS tablet Take 1 tablet by mouth daily.    Historical Provider, MD  QUEtiapine (SEROQUEL) 50 MG tablet Take 50 mg by mouth at bedtime.    Historical Provider, MD    Family History Family History    Problem Relation Age of Onset  . Diabetes Brother   . Hypertension Brother   . Cancer - Colon Sister     Social History Social History  Substance Use Topics  . Smoking status: Never Smoker  . Smokeless tobacco: Never Used  . Alcohol use No     Allergies   Phenergan [promethazine]   Review of Systems Review of Systems  Unable to perform ROS: Dementia     Physical Exam Updated Vital Signs BP 169/98   Pulse 112   Temp 98.1 F (36.7 C) (Oral)   Resp 16   Ht 5\' 4"  (1.626 m)   Wt 108 lb (49 kg)   SpO2 100%   BMI 18.54 kg/m   Physical Exam  Constitutional: No distress.  Appears in mild distress with pain. Is also quite anxious. As I entered the room she immediately starts shouting "I'm choking". Reassured. Improves.  HENT:  Head: Normocephalic.  Eyes: Conjunctivae are normal. Pupils are equal, round, and reactive to light. No scleral icterus.  Neck: Normal range of motion. Neck supple. No thyromegaly present.  Cardiovascular: Normal rate and regular rhythm.  Exam reveals no gallop and no friction rub.   No murmur heard. Pulmonary/Chest: Effort normal and breath sounds normal. No respiratory distress. She has no wheezes. She has no rales.  Abdominal: Soft. Bowel sounds are normal. She exhibits no distension. There is no tenderness. There is no rebound.  Tenderness in the lower abdomen. Immediate pain with palpation. But not rigid or guarding. No pain with movements, or with shouts.  Musculoskeletal: Normal range of motion.  Neurological: She is alert.  Skin: Skin is warm and dry. No rash noted.  Psychiatric: She has a normal mood and affect. Her behavior is normal.     ED Treatments / Results  Labs (all labs ordered are listed, but only abnormal results are displayed) Labs Reviewed  COMPREHENSIVE METABOLIC PANEL - Abnormal; Notable for the following:       Result Value   Glucose, Bld 109 (*)    BUN 21 (*)    GFR calc non Af Amer 54 (*)    All other  components within normal limits  LIPASE, BLOOD - Abnormal; Notable for the following:    Lipase 10 (*)    All other components within normal limits  URINALYSIS, ROUTINE W REFLEX MICROSCOPIC - Abnormal; Notable for the following:    APPearance CLOUDY (*)    Hgb urine dipstick SMALL (*)    Nitrite POSITIVE (*)    Leukocytes, UA MODERATE (*)    All other components within normal limits  URINALYSIS, MICROSCOPIC (REFLEX) - Abnormal; Notable for the following:    Bacteria, UA MANY (*)    Squamous Epithelial / LPF 6-30 (*)  All other components within normal limits  CBC WITH DIFFERENTIAL/PLATELET    EKG  EKG Interpretation None       Radiology Ct Abdomen Pelvis W Contrast  Result Date: 04/19/2016 CLINICAL DATA:  Bilateral lower quadrant tenderness for 1 day EXAM: CT ABDOMEN AND PELVIS WITH CONTRAST TECHNIQUE: Multidetector CT imaging of the abdomen and pelvis was performed using the standard protocol following bolus administration of intravenous contrast. CONTRAST:  ISOVUE-300 IOPAMIDOL (ISOVUE-300) INJECTION 61% COMPARISON:  May 27, 2009 FINDINGS: Lower chest: No acute abnormality. Hepatobiliary: There is diffuse low density of liver without vessel displacement. No focal liver lesion is identified. There are gallstones within the gallbladder. The common bowel duct measures 7 mm at the pancreatic head upper limits are normal for patient age. Pancreas: Unremarkable. No pancreatic ductal dilatation or surrounding inflammatory changes. Spleen: Normal in size without focal abnormality. Adrenals/Urinary Tract: Adrenal glands are unremarkable. Kidneys are normal, without renal calculi, focal lesion, or hydronephrosis. Bladder is unremarkable. Stomach/Bowel: There are abnormal thick wall small bowel loops in the pelvis. There is 3 cm abnormal circumferential the wall of the distal descending colon. There is a 4 mm focus air which may be extraluminal adjacent to the abnormal thick wall  distal descending colon. There is stranding inflammation surrounding the descending colon more prominently in the distal descending colon. There is a large hiatal hernia. Vascular/Lymphatic: Aortic atherosclerosis. No enlarged abdominal or pelvic lymph nodes. Reproductive: Uterus and bilateral adnexa are unremarkable. Other: Small amount free fluid is identified in the pelvis. Musculoskeletal: Degenerative joint changes of the spine are noted. IMPRESSION: 3 cm length of abnormal circumferential thick walled distal descending colon with adjacent focus air which may be extraluminal. Findings could be due to for colonic neoplasm with focal perforation. There are abnormal thick wall small bowel loops in the pelvis which may be inflammatory. These results will be called to the ordering clinician or representative by the Radiologist Assistant, and communication documented in the PACS or zVision Dashboard. Electronically Signed   By: Sherian Rein M.D.   On: 04/19/2016 14:13    Procedures Procedures (including critical care time)  Medications Ordered in ED Medications  morphine 4 MG/ML injection 4 mg (4 mg Intravenous Given 04/19/16 1240)  LORazepam (ATIVAN) injection 0.5 mg (0.5 mg Intravenous Given 04/19/16 1326)  metroNIDAZOLE (FLAGYL) IVPB 500 mg (500 mg Intravenous New Bag/Given 04/19/16 1435)  ondansetron (ZOFRAN) injection 4 mg (4 mg Intravenous Given 04/19/16 1240)  iopamidol (ISOVUE-300) 61 % injection 100 mL (100 mLs Intravenous Contrast Given 04/19/16 1348)  ciprofloxacin (CIPRO) tablet 500 mg (500 mg Oral Given 04/19/16 1434)     Initial Impression / Assessment and Plan / ED Course  I have reviewed the triage vital signs and the nursing notes.  Pertinent labs & imaging results that were available during my care of the patient were reviewed by me and considered in my medical decision making (see chart for details).  Clinical Course     Patient given morphine. Had some improvement in  pain. Remained quite anxious. Given Ativan 0.5. He has some continued discomfort but is resting calmly and comfortably.  No leukocytosis. No abnormalities of hepatobiliary pain clinic enzymes. BUN 21 creatinine 0.9 urine shows suggestion of infection. CT scan shows thickened distal colonic segment was surrounding inflammatory changes and small area of possible extraluminal air. Radiology read as possible malignancy with perforation. Differential diagnosis would also include diverticulitis with localized microperforation. Patient given Cipro and Flagyl IV. I placed a call hospitalist regarding  admission. I've placed a call 2, left a message with the patient's contact per her information her son him Katrinka BlazingSmith. I'm not able to contact family at this time. [facility]  Final Clinical Impressions(s) / ED Diagnoses   Final diagnoses:  Left lower quadrant pain    New Prescriptions New Prescriptions   No medications on file     Rolland PorterMark Braeley Buskey, MD 04/19/16 1456

## 2016-04-19 NOTE — ED Notes (Signed)
Called Tim, son to notify him that pt was going to 5W bed 25.   Lonnie on 5W [facility].   Documents sent with carelink include: EMTALA, carelink/transfer, med necessity, electronic signature, and facesheet.  Pt in stable condition leaving facility at this time.

## 2016-04-19 NOTE — ED Notes (Signed)
Pt taken to bathroom via wheelchair, able to stand and pivot with x1 person assist. C/o ABD pain

## 2016-04-20 DIAGNOSIS — K529 Noninfective gastroenteritis and colitis, unspecified: Secondary | ICD-10-CM

## 2016-04-20 LAB — COMPREHENSIVE METABOLIC PANEL
ALK PHOS: 62 U/L (ref 38–126)
ALT: 12 U/L — AB (ref 14–54)
ANION GAP: 11 (ref 5–15)
AST: 18 U/L (ref 15–41)
Albumin: 3 g/dL — ABNORMAL LOW (ref 3.5–5.0)
BILIRUBIN TOTAL: 0.9 mg/dL (ref 0.3–1.2)
BUN: 23 mg/dL — ABNORMAL HIGH (ref 6–20)
CALCIUM: 9 mg/dL (ref 8.9–10.3)
CO2: 23 mmol/L (ref 22–32)
CREATININE: 1.17 mg/dL — AB (ref 0.44–1.00)
Chloride: 103 mmol/L (ref 101–111)
GFR, EST AFRICAN AMERICAN: 46 mL/min — AB (ref >60)
GFR, EST NON AFRICAN AMERICAN: 39 mL/min — AB (ref >60)
Glucose, Bld: 113 mg/dL — ABNORMAL HIGH (ref 65–99)
Potassium: 3.8 mmol/L (ref 3.5–5.1)
SODIUM: 137 mmol/L (ref 135–145)
TOTAL PROTEIN: 5.8 g/dL — AB (ref 6.5–8.1)

## 2016-04-20 LAB — PROTIME-INR
INR: 1.32
PROTHROMBIN TIME: 16.5 s — AB (ref 11.4–15.2)

## 2016-04-20 LAB — CBC
HEMATOCRIT: 34.1 % — AB (ref 36.0–46.0)
Hemoglobin: 10.9 g/dL — ABNORMAL LOW (ref 12.0–15.0)
MCH: 26.3 pg (ref 26.0–34.0)
MCHC: 32 g/dL (ref 30.0–36.0)
MCV: 82.2 fL (ref 78.0–100.0)
PLATELETS: 222 10*3/uL (ref 150–400)
RBC: 4.15 MIL/uL (ref 3.87–5.11)
RDW: 13.9 % (ref 11.5–15.5)
WBC: 10.7 10*3/uL — AB (ref 4.0–10.5)

## 2016-04-20 LAB — MRSA PCR SCREENING: MRSA by PCR: NEGATIVE

## 2016-04-20 LAB — TSH: TSH: 3.113 u[IU]/mL (ref 0.350–4.500)

## 2016-04-20 LAB — MAGNESIUM: MAGNESIUM: 1.8 mg/dL (ref 1.7–2.4)

## 2016-04-20 MED ORDER — SODIUM CHLORIDE 0.9 % IV SOLN
INTRAVENOUS | Status: AC
  Administered 2016-04-20: 22:00:00 via INTRAVENOUS

## 2016-04-20 MED ORDER — LEVOTHYROXINE SODIUM 50 MCG PO TABS
50.0000 ug | ORAL_TABLET | ORAL | Status: DC
  Administered 2016-04-21 – 2016-04-30 (×5): 50 ug via ORAL
  Filled 2016-04-20 (×5): qty 1

## 2016-04-20 MED ORDER — PIPERACILLIN-TAZOBACTAM 3.375 G IVPB
3.3750 g | Freq: Three times a day (TID) | INTRAVENOUS | Status: DC
  Administered 2016-04-20 – 2016-04-29 (×28): 3.375 g via INTRAVENOUS
  Filled 2016-04-20 (×31): qty 50

## 2016-04-20 MED ORDER — DONEPEZIL HCL 10 MG PO TABS
10.0000 mg | ORAL_TABLET | Freq: Every day | ORAL | Status: DC
  Administered 2016-04-20 – 2016-04-30 (×11): 10 mg via ORAL
  Filled 2016-04-20 (×11): qty 1

## 2016-04-20 MED ORDER — QUETIAPINE FUMARATE 25 MG PO TABS
12.5000 mg | ORAL_TABLET | Freq: Every day | ORAL | Status: DC
  Administered 2016-04-20 – 2016-04-30 (×11): 12.5 mg via ORAL
  Filled 2016-04-20 (×11): qty 1

## 2016-04-20 NOTE — Progress Notes (Addendum)
PROGRESS NOTE                                                                                                                                                                                                             Patient Demographics:    Tara Hicks, is a 82 y.o. female, DOB - Dec 11, 1922, NGE:[id number]  Admit date - 04/19/2016   Admitting Physician Haydee SalterPhillip M Hobbs, MD  Outpatient Primary MD for the patient is Florentina JennyRIPP, HENRY, MD  LOS - 1  Outpatient Specialists: none  Chief Complaint  Patient presents with  . Abdominal Pain       Brief Narrative   82 year old female with moderate dementia, hypertension, hypothyroidism history of [facility] presented to the ED with abdominal pain. History limited due to dementia. Vitals were stable. CT scan of the abdomen done showed thick-walled distal descending colon with perforation. Admitted to hospitalist service and surgery consulted.   Subjective:   Patient denies any symptoms but on abdominal exam winces in pain.   Assessment  & Plan :   AcuteColitis/  diverticulitis with microperforation Keep nothing by mouth. Supportive care with gentle hydration, antiemetic and morphine when necessary for pain. Empiric IV antibiotic (Zosyn). Surgery following plan on conservative management with fluids, antibiotics and bowel rest. Serial abdominal exam.  UTI Follow cultures. On empiric antibiotic.   Essential hypertension Blood pressure soft. hold lisinopril and monitor on as needed hydralazine.  History of TIA Hold Plavix for now.  Dementia, moderate resident of assisted living. Per son is able to recognize family. Capable of most of her ADLs.     Code Status : DO NOT RESUSCITATE  Family Communication  : Spoke with son on the phone  Disposition Plan  : Pending hospital course  Barriers For Discharge : Active symptoms  Consults  :    CCS  Procedures  :  CT abdomen and pelvis  DVT Prophylaxis  :  Lovenox -   Lab Results  Component Value Date   PLT 222 04/20/2016    Antibiotics  :    Anti-infectives    Start     Dose/Rate Route Frequency Ordered Stop   04/20/16 0830  piperacillin-tazobactam (ZOSYN) IVPB 3.375 g     3.375 g 12.5 mL/hr over 240 Minutes Intravenous Every 8 hours 04/20/16 0825  04/20/16 0230  ciprofloxacin (CIPRO) IVPB 400 mg  Status:  Discontinued     400 mg 200 mL/hr over 60 Minutes Intravenous Every 12 hours 04/19/16 1938 04/20/16 0812   04/20/16 0000  metroNIDAZOLE (FLAGYL) IVPB 500 mg  Status:  Discontinued     500 mg 100 mL/hr over 60 Minutes Intravenous Every 8 hours 04/19/16 1938 04/20/16 0812   04/19/16 1430  metroNIDAZOLE (FLAGYL) IVPB 500 mg     500 mg 100 mL/hr over 60 Minutes Intravenous  Once 04/19/16 1428 04/19/16 1543   04/19/16 1430  ciprofloxacin (CIPRO) tablet 500 mg     500 mg Oral  Once 04/19/16 1428 04/19/16 1434        Objective:   Vitals:   04/20/16 0115 04/20/16 0355 04/20/16 0514 04/20/16 0608  BP:   (!) 94/41 102/61  Pulse:   74   Resp:   20   Temp:   99.1 F (37.3 C)   TempSrc:   Oral   SpO2:   93%   Weight: 53.7 kg (118 lb 4.8 oz) 53.7 kg (118 lb 4.8 oz)    Height:        Wt Readings from Last 3 Encounters:  04/20/16 53.7 kg (118 lb 4.8 oz)  01/28/16 53.9 kg (118 lb 13.3 oz)  11/08/15 54 kg (119 lb)     Intake/Output Summary (Last 24 hours) at 04/20/16 1203 Last data filed at 04/20/16 1100  Gross per 24 hour  Intake           933.33 ml  Output               20 ml  Net           913.33 ml     Physical Exam  Gen: not in distress HEENT:moist mucosa, supple neck Chest: clear b/l, no added sounds CVS: N S1&S2, no murmurs,  GI: soft, Distended, sluggish bowel sounds, left lower quadrant tenderness Musculoskeletal: warm, no edema CNS: AAOX1, nonfocal    Data Review:    CBC  Recent Labs Lab 04/19/16 1235 04/20/16 0824  WBC 7.7  10.7*  HGB 12.4 10.9*  HCT 38.8 34.1*  PLT 233 222  MCV 82.2 82.2  MCH 26.3 26.3  MCHC 32.0 32.0  RDW 13.7 13.9  LYMPHSABS 1.4  --   MONOABS 0.8  --   EOSABS 0.0  --   BASOSABS 0.0  --     Chemistries   Recent Labs Lab 04/19/16 1235 04/20/16 0824  NA 137 137  K 4.6 3.8  CL 101 103  CO2 26 23  GLUCOSE 109* 113*  BUN 21* 23*  CREATININE 0.90 1.17*  CALCIUM 9.9 9.0  MG  --  1.8  AST 18 18  ALT 14 12*  ALKPHOS 74 62  BILITOT 1.0 0.9   ------------------------------------------------------------------------------------------------------------------ No results for input(s): CHOL, HDL, LDLCALC, TRIG, CHOLHDL, LDLDIRECT in the last 72 hours.  No results found for: HGBA1C ------------------------------------------------------------------------------------------------------------------  Recent Labs  04/20/16 0824  TSH 3.113   ------------------------------------------------------------------------------------------------------------------ No results for input(s): VITAMINB12, FOLATE, FERRITIN, TIBC, IRON, RETICCTPCT in the last 72 hours.  Coagulation profile  Recent Labs Lab 04/20/16 0824  INR 1.32    No results for input(s): DDIMER in the last 72 hours.  Cardiac Enzymes No results for input(s): CKMB, TROPONINI, MYOGLOBIN in the last 168 hours.  Invalid input(s): CK ------------------------------------------------------------------------------------------------------------------ No results found for: BNP  Inpatient Medications  Scheduled Meds: . clopidogrel  75 mg Oral Daily  .  heparin  5,000 Units Subcutaneous Q8H  . [START ON 04/21/2016] levothyroxine  50 mcg Oral Once per day on Mon Wed Fri  . lisinopril  10 mg Oral Daily  . piperacillin-tazobactam (ZOSYN)  IV  3.375 g Intravenous Q8H  . QUEtiapine  25 mg Oral QHS   Continuous Infusions: . sodium chloride 75 mL/hr at 04/20/16 1035   PRN Meds:.acetaminophen **OR** acetaminophen, hydrALAZINE,  LORazepam, morphine injection, oxyCODONE  Micro Results Recent Results (from the past 240 hour(s))  MRSA PCR Screening     Status: None   Collection Time: 04/20/16 12:24 AM  Result Value Ref Range Status   MRSA by PCR NEGATIVE NEGATIVE Final    Comment:        The GeneXpert MRSA Assay (FDA approved for NASAL specimens only), is one component of a comprehensive MRSA colonization surveillance program. It is not intended to diagnose MRSA infection nor to guide or monitor treatment for MRSA infections.     Radiology Reports Ct Abdomen Pelvis W Contrast  Result Date: 04/19/2016 CLINICAL DATA:  Bilateral lower quadrant tenderness for 1 day EXAM: CT ABDOMEN AND PELVIS WITH CONTRAST TECHNIQUE: Multidetector CT imaging of the abdomen and pelvis was performed using the standard protocol following bolus administration of intravenous contrast. CONTRAST:  100mL ISOVUE-300 IOPAMIDOL (ISOVUE-300) INJECTION 61% COMPARISON:  May 27, 2009 FINDINGS: Lower chest: No acute abnormality. Hepatobiliary: There is diffuse low density of liver without vessel displacement. No focal liver lesion is identified. There are gallstones within the gallbladder. The common bowel duct measures 7 mm at the pancreatic head upper limits are normal for patient age. Pancreas: Unremarkable. No pancreatic ductal dilatation or surrounding inflammatory changes. Spleen: Normal in size without focal abnormality. Adrenals/Urinary Tract: Adrenal glands are unremarkable. Kidneys are normal, without renal calculi, focal lesion, or hydronephrosis. Bladder is unremarkable. Stomach/Bowel: There are abnormal thick wall small bowel loops in the pelvis. There is 3 cm abnormal circumferential the wall of the distal descending colon. There is a 4 mm focus air which may be extraluminal adjacent to the abnormal thick wall distal descending colon. There is stranding inflammation surrounding the descending colon more prominently in the distal  descending colon. There is a large hiatal hernia. Vascular/Lymphatic: Aortic atherosclerosis. No enlarged abdominal or pelvic lymph nodes. Reproductive: Uterus and bilateral adnexa are unremarkable. Other: Small amount free fluid is identified in the pelvis. Musculoskeletal: Degenerative joint changes of the spine are noted. IMPRESSION: 3 cm length of abnormal circumferential thick walled distal descending colon with adjacent focus air which may be extraluminal. Findings could be due to for colonic neoplasm with focal perforation. There are abnormal thick wall small bowel loops in the pelvis which may be inflammatory. These results will be called to the ordering clinician or representative by the Radiologist Assistant, and communication documented in the PACS or zVision Dashboard. Electronically Signed   By: Sherian ReinWei-Chen  Lin M.D.   On: 04/19/2016 14:13    Time Spent in minutes  25   Eddie NorthHUNGEL, Virgene Tirone M.D on 04/20/2016 at 12:03 PM  Between 7am to 7pm - Pager - [phone]  After 7pm go to [email or web address] - password [email or web address]  [facility] -  Office  [phone]

## 2016-04-20 NOTE — Progress Notes (Signed)
Patient confused and placed in camera surveillance room. Madelin RearLonnie Kalianne Fetting, MSN, RN, Reliant EnergyCMSRN

## 2016-04-20 NOTE — Progress Notes (Signed)
Assessment Active Problems:  Acute colitis/diverticulitis with microperforation-no evidence of sepsis; on Cipro and Flagyl   Dementia   Hypothyroidism   Essential hypertension   Urinary tract infection   Plan:  Recommended broader spectrum coverage so will change to IV Zosyn.  Continue bowel rest.   LOS: 1 day        Subjective: Pain not as bad.  Objective: Vital signs in last 24 hours: Temp:  [98.1 F (36.7 C)-99.9 F (37.7 C)] 99.1 F (37.3 C) (12/31 0514) Pulse Rate:  [62-112] 74 (12/31 0514) Resp:  [14-27] 20 (12/31 0514) BP: (94-216)/(41-102) 102/61 (12/31 0608) SpO2:  [92 %-100 %] 93 % (12/31 0514) Weight:  [49 kg (108 lb)-53.7 kg (118 lb 4.8 oz)] 53.7 kg (118 lb 4.8 oz) (12/31 0355) Last BM Date:  (PTA, pt unsure)  Intake/Output from previous day: 12/30 0701 - 12/31 0700 In: 933.3 [I.V.:633.3; IV Piggyback:300] Out: -  Intake/Output this shift: No intake/output data recorded.  PE: General- In NAD Abdomen-soft, moderate tenderness in LLQ and suprapubic regions.  Lab Results:   Recent Labs  04/19/16 1235  WBC 7.7  HGB 12.4  HCT 38.8  PLT 233   BMET  Recent Labs  04/19/16 1235  NA 137  K 4.6  CL 101  CO2 26  GLUCOSE 109*  BUN 21*  CREATININE 0.90  CALCIUM 9.9   PT/INR No results for input(s): LABPROT, INR in the last 72 hours. Comprehensive Metabolic Panel:    Component Value Date/Time   NA 137 04/19/2016 1235   NA 136 01/29/2016 0520   K 4.6 04/19/2016 1235   K 3.8 01/29/2016 0520   CL 101 04/19/2016 1235   CL 104 01/29/2016 0520   CO2 26 04/19/2016 1235   CO2 23 01/29/2016 0520   BUN 21 (H) 04/19/2016 1235   BUN 16 01/29/2016 0520   CREATININE 0.90 04/19/2016 1235   CREATININE 0.76 01/29/2016 0520   GLUCOSE 109 (H) 04/19/2016 1235   GLUCOSE 107 (H) 01/29/2016 0520   CALCIUM 9.9 04/19/2016 1235   CALCIUM 9.2 01/29/2016 0520   AST 18 04/19/2016 1235   AST 22 07/08/2014 1055   ALT 14 04/19/2016 1235   ALT 14 07/08/2014 1055    ALKPHOS 74 04/19/2016 1235   ALKPHOS 87 07/08/2014 1055   BILITOT 1.0 04/19/2016 1235   BILITOT 0.6 07/08/2014 1055   PROT 6.9 04/19/2016 1235   PROT 6.6 07/08/2014 1055   ALBUMIN 4.0 04/19/2016 1235   ALBUMIN 4.0 07/08/2014 1055     Studies/Results: Ct Abdomen Pelvis W Contrast  Result Date: 04/19/2016 CLINICAL DATA:  Bilateral lower quadrant tenderness for 1 day EXAM: CT ABDOMEN AND PELVIS WITH CONTRAST TECHNIQUE: Multidetector CT imaging of the abdomen and pelvis was performed using the standard protocol following bolus administration of intravenous contrast. CONTRAST:  [phone] MaWaldon MeCasimiro ClarksvilMadlyn FranJudeth Cor[address] [id number] 9Doreatha MaWaldon MeCasimiro [facility] FranJudeth Co 4 [phone] MaWaldon MeCasimiro Ut HealthMadlyn FranJudeth Cor415 Lex9910 I [phone] MaWaldon MeCasimiro Surgery Madlyn FranJudeth Cor8[address] [place] Ea Doreatha MaWaldon MeCasimiro [place] MMadlyn F4 P [phone] MaWaldon MeCasimiro [facility] RehabiMadlyn FranJudeth [place] [phone] MaWaldon MeCasimiro HarringtonMadlyn FranJudeth Cor620 [phone] MaWaldon MeCasimiro FlagstMadlyn FranJudeth Cor98 Fo 5 [phone] MaWaldon MeCasimiro Roundup MMadlyn FranJudeth Cor532 Cypr30 [phone] MaWaldon MeCasimiro [facility]opaMadlyn FranJudeth Cor[address] [phone] MaWaldon MeCasimiro Madlyn FranJudeth Cor9 [address] 5 [phone] MaWaldon MeCasimiro Ms [facility] RehMadlyn FranJudeth Cor80 [phone] MaWaldon MeCasimiro Amg SpecialtMadlyn FranJudeth Cor8312 Pu [phone] MaWaldon MeCasimiro [facility]byteriMadlyn FranJudeth Cor9 [phone] MaWaldon MeCasimiro B[facility] BehaviorMadlyn FranJudeth Cor250 Ceme57 <MEASUREMENTWaldon [facility] HoMadlyn [phone] MaWaldon MeCasimiro [facility] RehabilitMadlyn FranJudeth Cor9836 East [phone] MaWaldon MeCasimiro Surgery CMadlyn FranJudeth Cor23 Mo [phone] MaWaldon MeCasimiro Gastrointestinal CenMadlyn FranJudeth Cor7371 [address]193 LaMerchant navy officerISOVUE-300) INJECTION 61% COMPARISON:  May 27, 2009 FINDINGS: Lower chest: No acute abnormality. Hepatobiliary: There is diffuse low density of liver without vessel displacement. No focal liver lesion is identified. There are gallstones within the gallbladder. The common bowel duct measures 7 mm at the pancreatic head upper limits are normal for patient age. Pancreas: Unremarkable. No pancreatic ductal dilatation or surrounding inflammatory changes. Spleen: Normal in size without focal abnormality. Adrenals/Urinary Tract: Adrenal glands are unremarkable. Kidneys are normal, without renal calculi, focal lesion, or hydronephrosis. Bladder is unremarkable. Stomach/Bowel: There are abnormal thick wall small bowel loops in the pelvis. There is 3 cm abnormal circumferential the wall of the distal  descending colon. There is a 4 mm focus air which may be extraluminal adjacent to the abnormal thick wall distal descending colon. There is stranding inflammation surrounding the descending colon more prominently in the distal descending colon. There is a large hiatal hernia. Vascular/Lymphatic: Aortic atherosclerosis. No enlarged abdominal or pelvic lymph nodes. Reproductive: Uterus and bilateral adnexa are unremarkable. Other: Small amount free fluid is identified in the pelvis. Musculoskeletal: Degenerative  joint changes of the spine are noted. IMPRESSION: 3 cm length of abnormal circumferential thick walled distal descending colon with adjacent focus air which may be extraluminal. Findings could be due to for colonic neoplasm with focal perforation. There are abnormal thick wall small bowel loops in the pelvis which may be inflammatory. These results will be called to the ordering clinician or representative by the Radiologist Assistant, and communication documented in the PACS or zVision Dashboard. Electronically Signed   By: Sherian ReinWei-Chen  Lin M.D.   On: 04/19/2016 14:13    Anti-infectives: Anti-infectives    Start     Dose/Rate Route Frequency Ordered Stop   04/20/16 0230  ciprofloxacin (CIPRO) IVPB 400 mg     400 mg 200 mL/hr over 60 Minutes Intravenous Every 12 hours 04/19/16 1938     04/20/16 0000  metroNIDAZOLE (FLAGYL) IVPB 500 mg     500 mg 100 mL/hr over 60 Minutes Intravenous Every 8 hours 04/19/16 1938     04/19/16 1430  metroNIDAZOLE (FLAGYL) IVPB 500 mg     500 mg 100 mL/hr over 60 Minutes Intravenous  Once 04/19/16 1428 04/19/16 1543   04/19/16 1430  ciprofloxacin (CIPRO) tablet 500 mg     500 mg Oral  Once 04/19/16 1428 04/19/16 1434       Taksh Hjort J 04/20/2016

## 2016-04-20 NOTE — Progress Notes (Signed)
Pharmacy Antibiotic Note  Tara Hicks is a 82 y.o. female admitted on 04/19/2016 with acute colitis/diverticulitis with microperforation.Marland Kitchen.  Pharmacy has been consulted for Zosyn dosing.  Patient is currently afebrile with normal wbc.  CrCl is ~30-35 ml/min adjusted for age and weight.   Plan: Zosyn 3.375g IV q8h (4 hour infusion).  Monitor renal function and clinical improvement Follow-up length of therapy and surgical plans  Height: 5\' 4"  (162.6 cm) Weight: 118 lb 4.8 oz (53.7 kg) IBW/kg (Calculated) : 54.7  Temp (24hrs), Avg:99.1 F (37.3 C), Min:98.1 F (36.7 C), Max:99.9 F (37.7 C)   Recent Labs Lab 04/19/16 1235  WBC 7.7  CREATININE 0.90    Estimated Creatinine Clearance: 34.5 mL/min (by C-G formula based on SCr of 0.9 mg/dL).    Allergies  Allergen Reactions  . Phenergan [Promethazine] Other (See Comments)    hyperactive    Antimicrobials this admission:  Cipro 12/30 >> 12/31 Flagyl 12/30 >> 12/31 Zosyn 12/31 >>  Dose adjustments this admission:  None  Microbiology results:  12/31 MRSA PCR: negative  Thank you for allowing pharmacy to be a part of this patient's care.  Allie BossierApryl Anderson, PharmD PGY1 Pharmacy Resident [phone] (Pager) 04/20/2016 8:35 AM

## 2016-04-21 LAB — CBC
HEMATOCRIT: 31.3 % — AB (ref 36.0–46.0)
HEMOGLOBIN: 10 g/dL — AB (ref 12.0–15.0)
MCH: 26.9 pg (ref 26.0–34.0)
MCHC: 31.9 g/dL (ref 30.0–36.0)
MCV: 84.1 fL (ref 78.0–100.0)
Platelets: 210 10*3/uL (ref 150–400)
RBC: 3.72 MIL/uL — AB (ref 3.87–5.11)
RDW: 14.1 % (ref 11.5–15.5)
WBC: 9.9 10*3/uL (ref 4.0–10.5)

## 2016-04-21 NOTE — Progress Notes (Signed)
PROGRESS NOTE                                                                                                                                                                                                             Patient Demographics:    Tara Hicks, is a 83 y.o. female, DOB - 1922/12/03, WUJ:[id number]  Admit date - 04/19/2016   Admitting Physician Haydee SalterPhillip M Hobbs, MD  Outpatient Primary MD for the patient is Florentina JennyRIPP, HENRY, MD  LOS - 2  Outpatient Specialists: none  Chief Complaint  Patient presents with  . Abdominal Pain       Brief Narrative   83 year old female with moderate dementia, hypertension, hypothyroidism history of [facility] presented to the ED with abdominal pain. History limited due to dementia. Vitals were stable. CT scan of the abdomen done showed thick-walled distal descending colon with perforation. Admitted to hospitalist service and surgery consulted.   Subjective:   Patient remains confused and denies specific symptoms.   Assessment  & Plan :   AcuteColitis/  diverticulitis with microperforation Conservative management with empiric IV Zosyn, pain control and hydration. Surgery recommends since of water and ice chips. Serial abdominal exam.  UTI Culture not sent on admission, ordered today. On empiric antibiotics.   Essential hypertension Blood pressure soft. Holding lisinopril and monitor on as needed hydralazine.  History of TIA Hold Plavix for now.  Dementia, moderate resident of assisted living. Per son is able to recognize family. Capable of most of her ADLs.     Code Status : DO NOT RESUSCITATE  Family Communication  : Spoke with son on the phone on 12/31.  Disposition Plan  : Pending hospital course  Barriers For Discharge : Active symptoms  Consults  :   CCS  Procedures  :  CT abdomen and pelvis  DVT Prophylaxis  :  Lovenox  -   Lab Results  Component Value Date   PLT 210 04/21/2016    Antibiotics  :    Anti-infectives    Start     Dose/Rate Route Frequency Ordered Stop   04/20/16 0830  piperacillin-tazobactam (ZOSYN) IVPB 3.375 g     3.375 g 12.5 mL/hr over 240 Minutes Intravenous Every 8 hours 04/20/16 0825     04/20/16 0230  ciprofloxacin (CIPRO) IVPB 400 mg  Status:  Discontinued     400 mg 200 mL/hr over 60 Minutes Intravenous Every 12 hours 04/19/16 1938 04/20/16 0812   04/20/16 0000  metroNIDAZOLE (FLAGYL) IVPB 500 mg  Status:  Discontinued     500 mg 100 mL/hr over 60 Minutes Intravenous Every 8 hours 04/19/16 1938 04/20/16 0812   04/19/16 1430  metroNIDAZOLE (FLAGYL) IVPB 500 mg     500 mg 100 mL/hr over 60 Minutes Intravenous  Once 04/19/16 1428 04/19/16 1543   04/19/16 1430  ciprofloxacin (CIPRO) tablet 500 mg     500 mg Oral  Once 04/19/16 1428 04/19/16 1434        Objective:   Vitals:   04/20/16 0608 04/20/16 1237 04/20/16 2116 04/21/16 0547  BP: 102/61 (!) 104/47 (!) 113/51 (!) 114/53  Pulse:  71 73 70  Resp:  19 17 13   Temp:  98 F (36.7 C) 99 F (37.2 C) 98 F (36.7 C)  TempSrc:  Oral Oral Oral  SpO2:  92% 99%   Weight:      Height:        Wt Readings from Last 3 Encounters:  04/20/16 53.7 kg (118 lb 4.8 oz)  01/28/16 53.9 kg (118 lb 13.3 oz)  11/08/15 54 kg (119 lb)     Intake/Output Summary (Last 24 hours) at 04/21/16 1123 Last data filed at 04/20/16 1500  Gross per 24 hour  Intake           431.25 ml  Output                0 ml  Net           431.25 ml     Physical Exam  Gen: not in distress, Confused HEENT:moist mucosa, supple neck Chest: clear b/l, no added sounds CVS: N S1&S2, no murmurs,  GI: soft, Distended, sluggish bowel sounds, left lower quadrant tenderness Musculoskeletal: warm, no edema     Data Review:    CBC  Recent Labs Lab 04/19/16 1235 04/20/16 0824 04/21/16 0420  WBC 7.7 10.7* 9.9  HGB 12.4 10.9* 10.0*  HCT 38.8 34.1*  31.3*  PLT 233 222 210  MCV 82.2 82.2 84.1  MCH 26.3 26.3 26.9  MCHC 32.0 32.0 31.9  RDW 13.7 13.9 14.1  LYMPHSABS 1.4  --   --   MONOABS 0.8  --   --   EOSABS 0.0  --   --   BASOSABS 0.0  --   --     Chemistries   Recent Labs Lab 04/19/16 1235 04/20/16 0824  NA 137 137  K 4.6 3.8  CL 101 103  CO2 26 23  GLUCOSE 109* 113*  BUN 21* 23*  CREATININE 0.90 1.17*  CALCIUM 9.9 9.0  MG  --  1.8  AST 18 18  ALT 14 12*  ALKPHOS 74 62  BILITOT 1.0 0.9   ------------------------------------------------------------------------------------------------------------------ No results for input(s): CHOL, HDL, LDLCALC, TRIG, CHOLHDL, LDLDIRECT in the last 72 hours.  No results found for: HGBA1C ------------------------------------------------------------------------------------------------------------------  Recent Labs  04/20/16 0824  TSH 3.113   ------------------------------------------------------------------------------------------------------------------ No results for input(s): VITAMINB12, FOLATE, FERRITIN, TIBC, IRON, RETICCTPCT in the last 72 hours.  Coagulation profile  Recent Labs Lab 04/20/16 0824  INR 1.32    No results for input(s): DDIMER in the last 72 hours.  Cardiac Enzymes No results for input(s): CKMB, TROPONINI, MYOGLOBIN in the last 168 hours.  Invalid input(s): CK ------------------------------------------------------------------------------------------------------------------ No results found for: BNP  Inpatient Medications  Scheduled Meds: . donepezil  10 mg Oral QHS  . heparin  5,000 Units Subcutaneous Q8H  . levothyroxine  50 mcg Oral Once per day on Mon Wed Fri  . piperacillin-tazobactam (ZOSYN)  IV  3.375 g Intravenous Q8H  . QUEtiapine  12.5 mg Oral QHS   Continuous Infusions:  PRN Meds:.acetaminophen **OR** acetaminophen, hydrALAZINE, LORazepam, morphine injection  Micro Results Recent Results (from the past 240 hour(s))  MRSA  PCR Screening     Status: None   Collection Time: 04/20/16 12:24 AM  Result Value Ref Range Status   MRSA by PCR NEGATIVE NEGATIVE Final    Comment:        The GeneXpert MRSA Assay (FDA approved for NASAL specimens only), is one component of a comprehensive MRSA colonization surveillance program. It is not intended to diagnose MRSA infection nor to guide or monitor treatment for MRSA infections.     Radiology Reports Ct Abdomen Pelvis W Contrast  Result Date: 04/19/2016 CLINICAL DATA:  Bilateral lower quadrant tenderness for 1 day EXAM: CT ABDOMEN AND PELVIS WITH CONTRAST TECHNIQUE: Multidetector CT imaging of the abdomen and pelvis was performed using the standard protocol following bolus administration of intravenous contrast. CONTRAST:  ISOVUE-300 IOPAMIDOL (ISOVUE-300) INJECTION 61% COMPARISON:  May 27, 2009 FINDINGS: Lower chest: No acute abnormality. Hepatobiliary: There is diffuse low density of liver without vessel displacement. No focal liver lesion is identified. There are gallstones within the gallbladder. The common bowel duct measures 7 mm at the pancreatic head upper limits are normal for patient age. Pancreas: Unremarkable. No pancreatic ductal dilatation or surrounding inflammatory changes. Spleen: Normal in size without focal abnormality. Adrenals/Urinary Tract: Adrenal glands are unremarkable. Kidneys are normal, without renal calculi, focal lesion, or hydronephrosis. Bladder is unremarkable. Stomach/Bowel: There are abnormal thick wall small bowel loops in the pelvis. There is 3 cm abnormal circumferential the wall of the distal descending colon. There is a 4 mm focus air which may be extraluminal adjacent to the abnormal thick wall distal descending colon. There is stranding inflammation surrounding the descending colon more prominently in the distal descending colon. There is a large hiatal hernia. Vascular/Lymphatic: Aortic atherosclerosis. No enlarged abdominal  or pelvic lymph nodes. Reproductive: Uterus and bilateral adnexa are unremarkable. Other: Small amount free fluid is identified in the pelvis. Musculoskeletal: Degenerative joint changes of the spine are noted. IMPRESSION: 3 cm length of abnormal circumferential thick walled distal descending colon with adjacent focus air which may be extraluminal. Findings could be due to for colonic neoplasm with focal perforation. There are abnormal thick wall small bowel loops in the pelvis which may be inflammatory. These results will be called to the ordering clinician or representative by the Radiologist Assistant, and communication documented in the PACS or zVision Dashboard. Electronically Signed   By: Sherian Rein M.D.   On: 04/19/2016 14:13    Time Spent in minutes  25   Eddie North M.D on 04/21/2016 at 11:23 AM  Between 7am to 7pm - Pager - [phone]  After 7pm go to [email or web address] - password [facility]  [facility] -  Office  [phone]

## 2016-04-21 NOTE — Progress Notes (Signed)
Assessment Active Problems:  Acute colitis/diverticulitis with microperforation-no evidence of sepsis; on IV Zosyn; WBC back to normal   Dementia   Hypothyroidism   Essential hypertension   Urinary tract infection ?-Many Bacteria and Squamous epithelial cells on UA so ? Contaminated speciman.   Plan:  Sips of water and ice chips.  Check urine culture. Continue IV Zosyn.  LOS: 2 days        Subjective: Does not know why she is in the hospital.  States she still has left sided abdominal pain.  Objective: Vital signs in last 24 hours: Temp:  [98 F (36.7 C)-99 F (37.2 C)] 98 F (36.7 C) (01/01 0547) Pulse Rate:  [70-73] 70 (01/01 0547) Resp:  [13-19] 13 (01/01 0547) BP: (104-114)/(47-53) 114/53 (01/01 0547) SpO2:  [92 %-99 %] 99 % (12/31 2116) Last BM Date:  (PTA)  Intake/Output from previous day: 12/31 0701 - 01/01 0700 In: 431.3 [I.V.:331.3; IV Piggyback:100] Out: 20 [Urine:20] Intake/Output this shift: No intake/output data recorded.  PE: General- In NAD Abdomen-soft, moderate tenderness in LLQ but less in suprapubic regions.  Lab Results:   Recent Labs  04/20/16 0824 04/21/16 0420  WBC 10.7* 9.9  HGB 10.9* 10.0*  HCT 34.1* 31.3*  PLT 222 210   BMET  Recent Labs  04/19/16 1235 04/20/16 0824  NA 137 137  K 4.6 3.8  CL 101 103  CO2 26 23  GLUCOSE 109* 113*  BUN 21* 23*  CREATININE 0.90 1.17*  CALCIUM 9.9 9.0   PT/INR  Recent Labs  04/20/16 0824  LABPROT 16.5*  INR 1.32   Comprehensive Metabolic Panel:    Component Value Date/Time   NA 137 04/20/2016 0824   NA 137 04/19/2016 1235   K 3.8 04/20/2016 0824   K 4.6 04/19/2016 1235   CL 103 04/20/2016 0824   CL 101 04/19/2016 1235   CO2 23 04/20/2016 0824   CO2 26 04/19/2016 1235   BUN 23 (H) 04/20/2016 0824   BUN 21 (H) 04/19/2016 1235   CREATININE 1.17 (H) 04/20/2016 0824   CREATININE 0.90 04/19/2016 1235   GLUCOSE 113 (H) 04/20/2016 0824   GLUCOSE 109 (H) 04/19/2016 1235   CALCIUM  9.0 04/20/2016 0824   CALCIUM 9.9 04/19/2016 1235   AST 18 04/20/2016 0824   AST 18 04/19/2016 1235   ALT 12 (L) 04/20/2016 0824   ALT 14 04/19/2016 1235   ALKPHOS 62 04/20/2016 0824   ALKPHOS 74 04/19/2016 1235   BILITOT 0.9 04/20/2016 0824   BILITOT 1.0 04/19/2016 1235   PROT 5.8 (L) 04/20/2016 0824   PROT 6.9 04/19/2016 1235   ALBUMIN 3.0 (L) 04/20/2016 0824   ALBUMIN 4.0 04/19/2016 1235     Studies/Results: Ct Abdomen Pelvis W Contrast  Result Date: 04/19/2016 CLINICAL DATA:  Bilateral lower quadrant tenderness for 1 day EXAM: CT ABDOMEN AND PELVIS WITH CONTRAST TECHNIQUE: Multidetector CT imaging of the abdomen and pelvis was performed using the standard protocol following bolus administration of intravenous contrast. CONTRAST:  ISOVUE-300 IOPAMIDOL (ISOVUE-300) INJECTION 61% COMPARISON:  May 27, 2009 FINDINGS: Lower chest: No acute abnormality. Hepatobiliary: There is diffuse low density of liver without vessel displacement. No focal liver lesion is identified. There are gallstones within the gallbladder. The common bowel duct measures 7 mm at the pancreatic head upper limits are normal for patient age. Pancreas: Unremarkable. No pancreatic ductal dilatation or surrounding inflammatory changes. Spleen: Normal in size without focal abnormality. Adrenals/Urinary Tract: Adrenal glands are unremarkable. Kidneys are normal, without  renal calculi, focal lesion, or hydronephrosis. Bladder is unremarkable. Stomach/Bowel: There are abnormal thick wall small bowel loops in the pelvis. There is 3 cm abnormal circumferential the wall of the distal descending colon. There is a 4 mm focus air which may be extraluminal adjacent to the abnormal thick wall distal descending colon. There is stranding inflammation surrounding the descending colon more prominently in the distal descending colon. There is a large hiatal hernia. Vascular/Lymphatic: Aortic atherosclerosis. No enlarged abdominal or  pelvic lymph nodes. Reproductive: Uterus and bilateral adnexa are unremarkable. Other: Small amount free fluid is identified in the pelvis. Musculoskeletal: Degenerative joint changes of the spine are noted. IMPRESSION: 3 cm length of abnormal circumferential thick walled distal descending colon with adjacent focus air which may be extraluminal. Findings could be due to for colonic neoplasm with focal perforation. There are abnormal thick wall small bowel loops in the pelvis which may be inflammatory. These results will be called to the ordering clinician or representative by the Radiologist Assistant, and communication documented in the PACS or zVision Dashboard. Electronically Signed   By: Sherian ReinWei-Chen  Lin M.D.   On: 04/19/2016 14:13    Anti-infectives: Anti-infectives    Start     Dose/Rate Route Frequency Ordered Stop   04/20/16 0830  piperacillin-tazobactam (ZOSYN) IVPB 3.375 g     3.375 g 12.5 mL/hr over 240 Minutes Intravenous Every 8 hours 04/20/16 0825     04/20/16 0230  ciprofloxacin (CIPRO) IVPB 400 mg  Status:  Discontinued     400 mg 200 mL/hr over 60 Minutes Intravenous Every 12 hours 04/19/16 1938 04/20/16 0812   04/20/16 0000  metroNIDAZOLE (FLAGYL) IVPB 500 mg  Status:  Discontinued     500 mg 100 mL/hr over 60 Minutes Intravenous Every 8 hours 04/19/16 1938 04/20/16 0812   04/19/16 1430  metroNIDAZOLE (FLAGYL) IVPB 500 mg     500 mg 100 mL/hr over 60 Minutes Intravenous  Once 04/19/16 1428 04/19/16 1543   04/19/16 1430  ciprofloxacin (CIPRO) tablet 500 mg     500 mg Oral  Once 04/19/16 1428 04/19/16 1434       Vedh Ptacek J 04/21/2016

## 2016-04-22 DIAGNOSIS — R1032 Left lower quadrant pain: Secondary | ICD-10-CM

## 2016-04-22 LAB — BASIC METABOLIC PANEL
ANION GAP: 12 (ref 5–15)
BUN: 28 mg/dL — ABNORMAL HIGH (ref 6–20)
CHLORIDE: 104 mmol/L (ref 101–111)
CO2: 20 mmol/L — AB (ref 22–32)
Calcium: 8.9 mg/dL (ref 8.9–10.3)
Creatinine, Ser: 0.9 mg/dL (ref 0.44–1.00)
GFR calc non Af Amer: 54 mL/min — ABNORMAL LOW (ref >60)
Glucose, Bld: 92 mg/dL (ref 65–99)
POTASSIUM: 3.4 mmol/L — AB (ref 3.5–5.1)
Sodium: 136 mmol/L (ref 135–145)

## 2016-04-22 LAB — URINE CULTURE: CULTURE: NO GROWTH

## 2016-04-22 MED ORDER — LORAZEPAM 2 MG/ML IJ SOLN
1.0000 mg | Freq: Once | INTRAMUSCULAR | Status: AC
  Administered 2016-04-22: 1 mg via INTRAMUSCULAR
  Filled 2016-04-22: qty 1

## 2016-04-22 MED ORDER — POTASSIUM CHLORIDE CRYS ER 20 MEQ PO TBCR
40.0000 meq | EXTENDED_RELEASE_TABLET | Freq: Once | ORAL | Status: AC
  Administered 2016-04-22: 40 meq via ORAL
  Filled 2016-04-22: qty 2

## 2016-04-22 NOTE — Progress Notes (Signed)
PROGRESS NOTE                                                                                                                                                                                                             Patient Demographics:    Tara Hicks, is a 83 y.o. female, DOB - 1922-10-29, ZOX:[id number]  Admit date - 04/19/2016   Admitting Physician Haydee Salter, MD  Outpatient Primary MD for the patient is Florentina Jenny, MD  LOS - 3  Outpatient Specialists: none  Chief Complaint  Patient presents with  . Abdominal Pain       Brief Narrative   83 year old female with moderate dementia, hypertension, hypothyroidism history of TIA resident of [facility] presented to the ED with abdominal pain. History limited due to dementia. Vitals were stable. CT scan of the abdomen done showed thick-walled distal descending colon with perforation. Admitted to hospitalist service and surgery consulted.   Subjective:   Remains afebrile. Confused and asking why she has been locked up   Assessment  & Plan :   AcuteColitis/  diverticulitis with microperforation Conservative management with empiric IV Zosyn, pain control and hydration. Okay to start clears for surgery. Serial abdominal exam.  UTI Follow urine cultures. On empiric antibiotics.   Essential hypertension Blood pressure soft. Holding lisinopril and monitor on as needed hydralazine.  History of TIA Holding Plavix for now.  Dementia, moderate resident of assisted living. Per son is able to recognize family. Capable of most of her ADLs.   Hypokalemia Replenish  Acute kidney injury Mild secondary to dehydration. Resolved with fluids.   Code Status : DO NOT RESUSCITATE  Family Communication  : Spoke with son on the phone on 12/31.  Disposition Plan  : Pending hospital course and clinical improvement  Barriers For Discharge : Active  symptoms  Consults  :   CCS  Procedures  :  CT abdomen and pelvis  DVT Prophylaxis  :  Lovenox -   Lab Results  Component Value Date   PLT 210 04/21/2016    Antibiotics  :    Anti-infectives    Start     Dose/Rate Route Frequency Ordered Stop   04/20/16 0830  piperacillin-tazobactam (ZOSYN) IVPB 3.375 g     3.375 g 12.5 mL/hr over 240 Minutes Intravenous Every 8 hours 04/20/16  0825     04/20/16 0230  ciprofloxacin (CIPRO) IVPB 400 mg  Status:  Discontinued     400 mg 200 mL/hr over 60 Minutes Intravenous Every 12 hours 04/19/16 1938 04/20/16 0812   04/20/16 0000  metroNIDAZOLE (FLAGYL) IVPB 500 mg  Status:  Discontinued     500 mg 100 mL/hr over 60 Minutes Intravenous Every 8 hours 04/19/16 1938 04/20/16 0812   04/19/16 1430  metroNIDAZOLE (FLAGYL) IVPB 500 mg     500 mg 100 mL/hr over 60 Minutes Intravenous  Once 04/19/16 1428 04/19/16 1543   04/19/16 1430  ciprofloxacin (CIPRO) tablet 500 mg     500 mg Oral  Once 04/19/16 1428 04/19/16 1434        Objective:   Vitals:   04/21/16 0547 04/21/16 1439 04/21/16 2129 04/22/16 0451  BP: (!) 114/53 (!) 104/42 (!) 137/53 (!) 145/65  Pulse: 70 (!) 59 64 (!) 59  Resp: 13 13 18 20   Temp: 98 F (36.7 C) 98.1 F (36.7 C) 98.8 F (37.1 C) 98.4 F (36.9 C)  TempSrc: Oral Oral Oral Oral  SpO2:  95% 97% 94%  Weight:      Height:        Wt Readings from Last 3 Encounters:  04/20/16 53.7 kg (118 lb 4.8 oz)  01/28/16 53.9 kg (118 lb 13.3 oz)  11/08/15 54 kg (119 lb)     Intake/Output Summary (Last 24 hours) at 04/22/16 1313 Last data filed at 04/22/16 1230  Gross per 24 hour  Intake                0 ml  Output              200 ml  Net             -200 ml     Physical Exam  Gen: not in distress, Confused HEENT:moist mucosa, supple neck Chest: clear b/l CVS: N S1&S2, no murmurs,  GI: soft, Nondistended, bowel sounds present, left lower quadrant tenderness to pressure Musculoskeletal: warm, no edema     Data  Review:    CBC  Recent Labs Lab 04/19/16 1235 04/20/16 0824 04/21/16 0420  WBC 7.7 10.7* 9.9  HGB 12.4 10.9* 10.0*  HCT 38.8 34.1* 31.3*  PLT 233 222 210  MCV 82.2 82.2 84.1  MCH 26.3 26.3 26.9  MCHC 32.0 32.0 31.9  RDW 13.7 13.9 14.1  LYMPHSABS 1.4  --   --   MONOABS 0.8  --   --   EOSABS 0.0  --   --   BASOSABS 0.0  --   --     Chemistries   Recent Labs Lab 04/19/16 1235 04/20/16 0824 04/22/16 1148  NA 137 137 136  K 4.6 3.8 3.4*  CL 101 103 104  CO2 26 23 20*  GLUCOSE 109* 113* 92  BUN 21* 23* 28*  CREATININE 0.90 1.17* 0.90  CALCIUM 9.9 9.0 8.9  MG  --  1.8  --   AST 18 18  --   ALT 14 12*  --   ALKPHOS 74 62  --   BILITOT 1.0 0.9  --    ------------------------------------------------------------------------------------------------------------------ No results for input(s): CHOL, HDL, LDLCALC, TRIG, CHOLHDL, LDLDIRECT in the last 72 hours.  No results found for: HGBA1C ------------------------------------------------------------------------------------------------------------------  Recent Labs  04/20/16 0824  TSH 3.113   ------------------------------------------------------------------------------------------------------------------ No results for input(s): VITAMINB12, FOLATE, FERRITIN, TIBC, IRON, RETICCTPCT in the last 72 hours.  Coagulation profile  Recent  Labs Lab 04/20/16 0824  INR 1.32    No results for input(s): DDIMER in the last 72 hours.  Cardiac Enzymes No results for input(s): CKMB, TROPONINI, MYOGLOBIN in the last 168 hours.  Invalid input(s): CK ------------------------------------------------------------------------------------------------------------------ No results found for: BNP  Inpatient Medications  Scheduled Meds: . donepezil  10 mg Oral QHS  . heparin  5,000 Units Subcutaneous Q8H  . levothyroxine  50 mcg Oral Once per day on Mon Wed Fri  . piperacillin-tazobactam (ZOSYN)  IV  3.375 g Intravenous Q8H  .  QUEtiapine  12.5 mg Oral QHS   Continuous Infusions:  PRN Meds:.acetaminophen **OR** acetaminophen, hydrALAZINE, LORazepam, morphine injection  Micro Results Recent Results (from the past 240 hour(s))  MRSA PCR Screening     Status: None   Collection Time: 04/20/16 12:24 AM  Result Value Ref Range Status   MRSA by PCR NEGATIVE NEGATIVE Final    Comment:        The GeneXpert MRSA Assay (FDA approved for NASAL specimens only), is one component of a comprehensive MRSA colonization surveillance program. It is not intended to diagnose MRSA infection nor to guide or monitor treatment for MRSA infections.     Radiology Reports Ct Abdomen Pelvis W Contrast  Result Date: 04/19/2016 CLINICAL DATA:  Bilateral lower quadrant tenderness for 1 day EXAM: CT ABDOMEN AND PELVIS WITH CONTRAST TECHNIQUE: Multidetector CT imaging of the abdomen and pelvis was performed using the standard protocol following bolus administration of intravenous contrast. CONTRAST:  100mL ISOVUE-300 IOPAMIDOL (ISOVUE-300) INJECTION 61% COMPARISON:  May 27, 2009 FINDINGS: Lower chest: No acute abnormality. Hepatobiliary: There is diffuse low density of liver without vessel displacement. No focal liver lesion is identified. There are gallstones within the gallbladder. The common bowel duct measures 7 mm at the pancreatic head upper limits are normal for patient age. Pancreas: Unremarkable. No pancreatic ductal dilatation or surrounding inflammatory changes. Spleen: Normal in size without focal abnormality. Adrenals/Urinary Tract: Adrenal glands are unremarkable. Kidneys are normal, without renal calculi, focal lesion, or hydronephrosis. Bladder is unremarkable. Stomach/Bowel: There are abnormal thick wall small bowel loops in the pelvis. There is 3 cm abnormal circumferential the wall of the distal descending colon. There is a 4 mm focus air which may be extraluminal adjacent to the abnormal thick wall distal descending  colon. There is stranding inflammation surrounding the descending colon more prominently in the distal descending colon. There is a large hiatal hernia. Vascular/Lymphatic: Aortic atherosclerosis. No enlarged abdominal or pelvic lymph nodes. Reproductive: Uterus and bilateral adnexa are unremarkable. Other: Small amount free fluid is identified in the pelvis. Musculoskeletal: Degenerative joint changes of the spine are noted. IMPRESSION: 3 cm length of abnormal circumferential thick walled distal descending colon with adjacent focus air which may be extraluminal. Findings could be due to for colonic neoplasm with focal perforation. There are abnormal thick wall small bowel loops in the pelvis which may be inflammatory. These results will be called to the ordering clinician or representative by the Radiologist Assistant, and communication documented in the PACS or zVision Dashboard. Electronically Signed   By: Sherian ReinWei-Chen  Lin M.D.   On: 04/19/2016 14:13    Time Spent in minutes  25   Eddie NorthHUNGEL, Jaquila Santelli M.D on 04/22/2016 at 1:13 PM  Between 7am to 7pm - Pager - [phone]  After 7pm go to [email or web address] - password [email or web address]  [facility] -  Office  [phone]

## 2016-04-22 NOTE — Progress Notes (Signed)
[facility] Progress Note     Subjective: Demented. Denies pain at rest.  Objective: Vital signs in last 24 hours: Temp:  [98.1 F (36.7 C)-98.8 F (37.1 C)] 98.4 F (36.9 C) (01/02 0451) Pulse Rate:  [59-64] 59 (01/02 0451) Resp:  [13-20] 20 (01/02 0451) BP: (104-145)/(42-65) 145/65 (01/02 0451) SpO2:  [94 %-97 %] 94 % (01/02 0451) Last BM Date:  (PTA)  Intake/Output from previous day: 01/01 0701 - 01/02 0700 In: -  Out: 100 [Urine:100] Intake/Output this shift: No intake/output data recorded.  PE: Gen:  Alert, demented, mildly agitated. Pulm:  Unlabored Abd: soft, LLQ tenderness with guarding, +BS  Lab Results:   Recent Labs  04/20/16 0824 04/21/16 0420  WBC 10.7* 9.9  HGB 10.9* 10.0*  HCT 34.1* 31.3*  PLT 222 210   BMET  Recent Labs  04/19/16 1235 04/20/16 0824  NA 137 137  K 4.6 3.8  CL 101 103  CO2 26 23  GLUCOSE 109* 113*  BUN 21* 23*  CREATININE 0.90 1.17*  CALCIUM 9.9 9.0   PT/INR  Recent Labs  04/20/16 0824  LABPROT 16.5*  INR 1.32   CMP     Component Value Date/Time   NA 137 04/20/2016 0824   K 3.8 04/20/2016 0824   CL 103 04/20/2016 0824   CO2 23 04/20/2016 0824   GLUCOSE 113 (H) 04/20/2016 0824   BUN 23 (H) 04/20/2016 0824   CREATININE 1.17 (H) 04/20/2016 0824   CALCIUM 9.0 04/20/2016 0824   PROT 5.8 (L) 04/20/2016 0824   ALBUMIN 3.0 (L) 04/20/2016 0824   AST 18 04/20/2016 0824   ALT 12 (L) 04/20/2016 0824   ALKPHOS 62 04/20/2016 0824   BILITOT 0.9 04/20/2016 0824   GFRNONAA 39 (L) 04/20/2016 0824   GFRAA 46 (L) 04/20/2016 0824   Lipase     Component Value Date/Time   LIPASE 10 (L) 04/19/2016 1235       Studies/Results: No results found.  Anti-infectives: Anti-infectives    Start     Dose/Rate Route Frequency Ordered Stop   04/20/16 0830  piperacillin-tazobactam (ZOSYN) IVPB 3.375 g     3.375 g 12.5 mL/hr over 240 Minutes Intravenous Every 8 hours 04/20/16 0825     04/20/16 0230   ciprofloxacin (CIPRO) IVPB 400 mg  Status:  Discontinued     400 mg 200 mL/hr over 60 Minutes Intravenous Every 12 hours 04/19/16 1938 04/20/16 0812   04/20/16 0000  metroNIDAZOLE (FLAGYL) IVPB 500 mg  Status:  Discontinued     500 mg 100 mL/hr over 60 Minutes Intravenous Every 8 hours 04/19/16 1938 04/20/16 0812   04/19/16 1430  metroNIDAZOLE (FLAGYL) IVPB 500 mg     500 mg 100 mL/hr over 60 Minutes Intravenous  Once 04/19/16 1428 04/19/16 1543   04/19/16 1430  ciprofloxacin (CIPRO) tablet 500 mg     500 mg Oral  Once 04/19/16 1428 04/19/16 1434       Assessment/Plan Acute colitis/diverticulitis with microperforation-no evidence of sepsis WBC 9.9 on 04/22/15 Afebrile, VSS  Continue IV abx Clear liquids today     Dementia   Hypothyroidism   Essential hypertension   Urinary tract infection  ID:  Cipro and Flagyl, Zosyn   Plan: continue non-operative management with IV abx, clear liquid diet  We will follow    LOS: 3 days    Adam PhenixElizabeth S Derius Ghosh , [facility] 04/22/2016, 9:58 AM Pager: [phone] Consults: [phone] Mon-Fri 7:00 am-4:30 pm Sat-Sun 7:00 am-11:30 am

## 2016-04-22 NOTE — Progress Notes (Addendum)
At around 10:49pm, 04/23/15, pt was agitated, pulling on care equipments.Tele was pulled about 4X, IV's been pulled by pt as well.Pt pulled mittens out. She has Zosyn.NP was asked if the ABX PO can be given instead and telemetry d/ced. NP responded to get an IV assess. IM ativan was given without improvement. Restraints were ordered by NP. Restraints applied, IV Zosyn was given, and telemetry was resumed. Will continue to monitor.

## 2016-04-22 NOTE — Clinical Social Work Note (Signed)
Clinical Social Work Assessment  Patient Details  Name: Tara Hicks MRN: [id number] Date of Birth: 04-Sep-1922  Date of referral:  04/22/16               Reason for consult:  Facility Placement                Permission sought to share information with:  Facility Medical sales representativeContact Representative, Family Supports Permission granted to share information::  No  Name::     Radio producerTim  Agency::  [facility]  Relationship::  Son  SolicitorContact Information:     Housing/Transportation Living arrangements for the past 2 months:  Assisted Living Facility Source of Information:  Adult Children Patient Interpreter Needed:  None Criminal Activity/Legal Involvement Pertinent to Current Situation/Hospitalization:  No - Comment as needed Significant Relationships:  Adult Children Lives with:  Facility Resident Do you feel safe going back to the place where you live?  Yes Need for family participation in patient care:  Yes (Comment)  Care giving concerns:  CSW received consult regarding discharge planning. Patient is disoriented. CSW spoke with patient's son, [name]. He reported [facility]. At this point he is unsure if patient is able to return, depending on if she needs surgery. CSW to continue to follow for discharge needs.   Social Worker assessment / plan:  CSW spoke with patient's son regarding return to ALF.   Employment status:  Retired Health and safety inspectornsurance information:  Medicare PT Recommendations:  Not assessed at this time Information / Referral to community resources:     Patient/Family's Response to care:  Patient's son reports that he would like patient to return to ALF, but is unsure at this time what level of care she will need.  Patient/Family's Understanding of and Emotional Response to Diagnosis, Current Treatment, and Prognosis: Patient's son expressed understanding of CSW role and discharge process. No questions/concerns about plan or treatment.    Emotional  Assessment Appearance:  Appears stated age Attitude/Demeanor/Rapport:  Unable to Assess Affect (typically observed):  Unable to Assess Orientation:  Oriented to Self Alcohol / Substance use:  Not Applicable Psych involvement (Current and /or in the community):  No (Comment)  Discharge Needs  Concerns to be addressed:  Care Coordination Readmission within the last 30 days:  No Current discharge risk:  None Barriers to Discharge:  Continued Medical Work up   [employer] Incadia S Mendi Constable, LCSWA 04/22/2016, 12:58 PM

## 2016-04-23 DIAGNOSIS — R410 Disorientation, unspecified: Secondary | ICD-10-CM

## 2016-04-23 LAB — CBC
HEMATOCRIT: 35 % — AB (ref 36.0–46.0)
HEMOGLOBIN: 11.4 g/dL — AB (ref 12.0–15.0)
MCH: 26.5 pg (ref 26.0–34.0)
MCHC: 32.6 g/dL (ref 30.0–36.0)
MCV: 81.2 fL (ref 78.0–100.0)
Platelets: 270 10*3/uL (ref 150–400)
RBC: 4.31 MIL/uL (ref 3.87–5.11)
RDW: 13.2 % (ref 11.5–15.5)
WBC: 7.7 10*3/uL (ref 4.0–10.5)

## 2016-04-23 LAB — BASIC METABOLIC PANEL
Anion gap: 9 (ref 5–15)
BUN: 14 mg/dL (ref 6–20)
CHLORIDE: 106 mmol/L (ref 101–111)
CO2: 25 mmol/L (ref 22–32)
Calcium: 9.5 mg/dL (ref 8.9–10.3)
Creatinine, Ser: 0.82 mg/dL (ref 0.44–1.00)
GFR calc Af Amer: >60 mL/min (ref >60)
GFR calc non Af Amer: >60 mL/min (ref >60)
GLUCOSE: 118 mg/dL — AB (ref 65–99)
POTASSIUM: 3.9 mmol/L (ref 3.5–5.1)
Sodium: 140 mmol/L (ref 135–145)

## 2016-04-23 MED ORDER — HALOPERIDOL LACTATE 5 MG/ML IJ SOLN
1.0000 mg | Freq: Four times a day (QID) | INTRAMUSCULAR | Status: DC | PRN
  Administered 2016-04-23 – 2016-04-28 (×2): 1 mg via INTRAVENOUS
  Filled 2016-04-23 (×2): qty 1

## 2016-04-23 NOTE — Care Management Important Message (Signed)
Important Message  Patient Details  Name: Tara Hicks MRN: [id number] Date of Birth: May 26, 1922   Medicare Important Message Given:  Yes    Kyla BalzarineShealy, Latiffany Harwick Abena 04/23/2016, 12:33 PM

## 2016-04-23 NOTE — Progress Notes (Signed)
PROGRESS NOTE                                                                                                                                                                                                             Patient Demographics:    Tara Hicks, is a 83 y.o. female, DOB - Oct 22, 1922, AVW:[id number]  Admit date - 04/19/2016   Admitting Physician Haydee Salter, MD  Outpatient Primary MD for the patient is Florentina Jenny, MD  LOS - 4  Outpatient Specialists: none  Chief Complaint  Patient presents with  . Abdominal Pain       Brief Narrative   83 year old female with moderate dementia, hypertension, hypothyroidism history of TIA resident of [facility] assisted living presented to the ED with abdominal pain. History limited due to dementia. Vitals were stable. CT scan of the abdomen done showed thick-walled distal descending colon with perforation. Admitted to hospitalist service and surgery consulted.   Subjective:   Patient confused and calling out her IV line last night. Afebrile.   Assessment  & Plan :   AcuteColitis/  diverticulitis with microperforation Conservative management with empiric IV Zosyn, pain control and hydration. Alternating tears. Abdominal tenderness much improved on exam today. Possibly advance diet further.  UTI On empiric antibiotics. Cultures negative.   Essential hypertension Blood pressure elevated. Resumed lisinopril (was held due to soft blood pressure on admission). When necessary hydralazine.  History of TIA Holding Plavix for now.  Dementia, moderate with acute delirium resident of assisted living. Per son is able to recognize some of the family members. Capable of most of her ADLs. Patient will probably need skilled care. Will order physical therapy.  Son informed that patient does not tolerate Ativan or Seroquel well. Will monitor on when necessary Haldol for  sundowning. She seems to have tolerated Seroquel which I will continue.  Hypokalemia Replenished  Acute kidney injury Mild secondary to dehydration. Resolved with fluids.   Code Status : DO NOT RESUSCITATE  Family Communication  : son at bedside  Disposition Plan  : Pending hospital course and clinical improvement  Barriers For Discharge : Active symptoms  Consults  :   CCS  Procedures  :  CT abdomen and pelvis  DVT Prophylaxis  :  Lovenox -   Lab Results  Component Value Date   PLT 270  04/23/2016    Antibiotics  :    Anti-infectives    Start     Dose/Rate Route Frequency Ordered Stop   04/20/16 0830  piperacillin-tazobactam (ZOSYN) IVPB 3.375 g     3.375 g 12.5 mL/hr over 240 Minutes Intravenous Every 8 hours 04/20/16 0825     04/20/16 0230  ciprofloxacin (CIPRO) IVPB 400 mg  Status:  Discontinued     400 mg 200 mL/hr over 60 Minutes Intravenous Every 12 hours 04/19/16 1938 04/20/16 0812   04/20/16 0000  metroNIDAZOLE (FLAGYL) IVPB 500 mg  Status:  Discontinued     500 mg 100 mL/hr over 60 Minutes Intravenous Every 8 hours 04/19/16 1938 04/20/16 0812   04/19/16 1430  metroNIDAZOLE (FLAGYL) IVPB 500 mg     500 mg 100 mL/hr over 60 Minutes Intravenous  Once 04/19/16 1428 04/19/16 1543   04/19/16 1430  ciprofloxacin (CIPRO) tablet 500 mg     500 mg Oral  Once 04/19/16 1428 04/19/16 1434        Objective:   Vitals:   04/22/16 0451 04/22/16 1343 04/22/16 2138 04/23/16 0632  BP: (!) 145/65 (!) 167/72 (!) 141/66 (!) 167/73  Pulse: (!) 59 66 (!) 59 (!) 58  Resp: 20 20 18 18   Temp: 98.4 F (36.9 C) 98 F (36.7 C) 98.1 F (36.7 C) 97.7 F (36.5 C)  TempSrc: Oral Oral Oral Oral  SpO2: 94% 99% 97% 94%  Weight:      Height:        Wt Readings from Last 3 Encounters:  04/20/16 53.7 kg (118 lb 4.8 oz)  01/28/16 53.9 kg (118 lb 13.3 oz)  11/08/15 54 kg (119 lb)     Intake/Output Summary (Last 24 hours) at 04/23/16 1341 Last data filed at 04/23/16 1017   Gross per 24 hour  Intake                0 ml  Output              202 ml  Net             -202 ml     Physical Exam  Gen: not in distress, Confused HEENT:moist mucosa, supple neck Chest: clear b/l CVS: N S1&S2, no murmurs,  GI: soft, Nondistended, bowel sounds present, No further left lower quadrant tenderness. Musculoskeletal: warm, no edema     Data Review:    CBC  Recent Labs Lab 04/19/16 1235 04/20/16 0824 04/21/16 0420 04/23/16 0835  WBC 7.7 10.7* 9.9 7.7  HGB 12.4 10.9* 10.0* 11.4*  HCT 38.8 34.1* 31.3* 35.0*  PLT 233 222 210 270  MCV 82.2 82.2 84.1 81.2  MCH 26.3 26.3 26.9 26.5  MCHC 32.0 32.0 31.9 32.6  RDW 13.7 13.9 14.1 13.2  LYMPHSABS 1.4  --   --   --   MONOABS 0.8  --   --   --   EOSABS 0.0  --   --   --   BASOSABS 0.0  --   --   --     Chemistries   Recent Labs Lab 04/19/16 1235 04/20/16 0824 04/22/16 1148 04/23/16 0811  NA 137 137 136 140  K 4.6 3.8 3.4* 3.9  CL 101 103 104 106  CO2 26 23 20* 25  GLUCOSE 109* 113* 92 118*  BUN 21* 23* 28* 14  CREATININE 0.90 1.17* 0.90 0.82  CALCIUM 9.9 9.0 8.9 9.5  MG  --  1.8  --   --  AST 18 18  --   --   ALT 14 12*  --   --   ALKPHOS 74 62  --   --   BILITOT 1.0 0.9  --   --    ------------------------------------------------------------------------------------------------------------------ No results for input(s): CHOL, HDL, LDLCALC, TRIG, CHOLHDL, LDLDIRECT in the last 72 hours.  No results found for: HGBA1C ------------------------------------------------------------------------------------------------------------------ No results for input(s): TSH, T4TOTAL, T3FREE, THYROIDAB in the last 72 hours.  Invalid input(s): FREET3 ------------------------------------------------------------------------------------------------------------------ No results for input(s): VITAMINB12, FOLATE, FERRITIN, TIBC, IRON, RETICCTPCT in the last 72 hours.  Coagulation profile  Recent Labs Lab  04/20/16 0824  INR 1.32    No results for input(s): DDIMER in the last 72 hours.  Cardiac Enzymes No results for input(s): CKMB, TROPONINI, MYOGLOBIN in the last 168 hours.  Invalid input(s): CK ------------------------------------------------------------------------------------------------------------------ No results found for: BNP  Inpatient Medications  Scheduled Meds: . donepezil  10 mg Oral QHS  . heparin  5,000 Units Subcutaneous Q8H  . levothyroxine  50 mcg Oral Once per day on Mon Wed Fri  . piperacillin-tazobactam (ZOSYN)  IV  3.375 g Intravenous Q8H  . QUEtiapine  12.5 mg Oral QHS   Continuous Infusions:  PRN Meds:.acetaminophen **OR** acetaminophen, hydrALAZINE, LORazepam, morphine injection  Micro Results Recent Results (from the past 240 hour(s))  MRSA PCR Screening     Status: None   Collection Time: 04/20/16 12:24 AM  Result Value Ref Range Status   MRSA by PCR NEGATIVE NEGATIVE Final    Comment:        The GeneXpert MRSA Assay (FDA approved for NASAL specimens only), is one component of a comprehensive MRSA colonization surveillance program. It is not intended to diagnose MRSA infection nor to guide or monitor treatment for MRSA infections.   Culture, Urine     Status: None   Collection Time: 04/21/16  4:22 PM  Result Value Ref Range Status   Specimen Description URINE, CATHETERIZED  Final   Special Requests NONE  Final   Culture NO GROWTH  Final   Report Status 04/22/2016 FINAL  Final    Radiology Reports Ct Abdomen Pelvis W Contrast  Result Date: 04/19/2016 CLINICAL DATA:  Bilateral lower quadrant tenderness for 1 day EXAM: CT ABDOMEN AND PELVIS WITH CONTRAST TECHNIQUE: Multidetector CT imaging of the abdomen and pelvis was performed using the standard protocol following bolus administration of intravenous contrast. CONTRAST:  100mL ISOVUE-300 IOPAMIDOL (ISOVUE-300) INJECTION 61% COMPARISON:  May 27, 2009 FINDINGS: Lower chest: No acute  abnormality. Hepatobiliary: There is diffuse low density of liver without vessel displacement. No focal liver lesion is identified. There are gallstones within the gallbladder. The common bowel duct measures 7 mm at the pancreatic head upper limits are normal for patient age. Pancreas: Unremarkable. No pancreatic ductal dilatation or surrounding inflammatory changes. Spleen: Normal in size without focal abnormality. Adrenals/Urinary Tract: Adrenal glands are unremarkable. Kidneys are normal, without renal calculi, focal lesion, or hydronephrosis. Bladder is unremarkable. Stomach/Bowel: There are abnormal thick wall small bowel loops in the pelvis. There is 3 cm abnormal circumferential the wall of the distal descending colon. There is a 4 mm focus air which may be extraluminal adjacent to the abnormal thick wall distal descending colon. There is stranding inflammation surrounding the descending colon more prominently in the distal descending colon. There is a large hiatal hernia. Vascular/Lymphatic: Aortic atherosclerosis. No enlarged abdominal or pelvic lymph nodes. Reproductive: Uterus and bilateral adnexa are unremarkable. Other: Small amount free fluid is identified in the pelvis.  Musculoskeletal: Degenerative joint changes of the spine are noted. IMPRESSION: 3 cm length of abnormal circumferential thick walled distal descending colon with adjacent focus air which may be extraluminal. Findings could be due to for colonic neoplasm with focal perforation. There are abnormal thick wall small bowel loops in the pelvis which may be inflammatory. These results will be called to the ordering clinician or representative by the Radiologist Assistant, and communication documented in the PACS or zVision Dashboard. Electronically Signed   By: Sherian Rein M.D.   On: 04/19/2016 14:13    Time Spent in minutes  25   Eddie North M.D on 04/23/2016 at 1:41 PM  Between 7am to 7pm - Pager - [phone]  After 7pm go  to [email or web address] - password [facility]  [facility] -  Office  [phone]

## 2016-04-23 NOTE — Progress Notes (Signed)
[facility] Progress Note     Subjective: Pt with history of dementia. She thinks she is at my house. She denies abdominal pain at rest. She denies fever, chills, nausea or vomiting.  Objective: Vital signs in last 24 hours: Temp:  [97.7 F (36.5 C)-98.1 F (36.7 C)] 97.7 F (36.5 C) (01/03 9604) Pulse Rate:  [58-66] 58 (01/03 0632) Resp:  [18-20] 18 (01/03 5409) BP: (141-167)/(66-73) 167/73 (01/03 8119) SpO2:  [94 %-99 %] 94 % (01/03 1478) Last BM Date:  (PTA)  Intake/Output from previous day: 01/02 0701 - 01/03 0700 In: 360 [P.O.:360] Out: 102 [Urine:101; Stool:1] Intake/Output this shift: No intake/output data recorded.  PE: Gen:  Alert, NAD, pleasant, cooperative, not oriented to place or time Card:  Regular rate, no M/G/R heard Pulm:  CTA anteriorly, no W/R/R, effort normal Abd: Soft, non distended, +BS, TTP of LLQ, no abdominal scars noted   Lab Results:   Recent Labs  04/20/16 0824 04/21/16 0420  WBC 10.7* 9.9  HGB 10.9* 10.0*  HCT 34.1* 31.3*  PLT 222 210   BMET  Recent Labs  04/20/16 0824 04/22/16 1148  NA 137 136  K 3.8 3.4*  CL 103 104  CO2 23 20*  GLUCOSE 113* 92  BUN 23* 28*  CREATININE 1.17* 0.90  CALCIUM 9.0 8.9   PT/INR  Recent Labs  04/20/16 0824  LABPROT 16.5*  INR 1.32   CMP     Component Value Date/Time   NA 136 04/22/2016 1148   K 3.4 (L) 04/22/2016 1148   CL 104 04/22/2016 1148   CO2 20 (L) 04/22/2016 1148   GLUCOSE 92 04/22/2016 1148   BUN 28 (H) 04/22/2016 1148   CREATININE 0.90 04/22/2016 1148   CALCIUM 8.9 04/22/2016 1148   PROT 5.8 (L) 04/20/2016 0824   ALBUMIN 3.0 (L) 04/20/2016 0824   AST 18 04/20/2016 0824   ALT 12 (L) 04/20/2016 0824   ALKPHOS 62 04/20/2016 0824   BILITOT 0.9 04/20/2016 0824   GFRNONAA 54 (L) 04/22/2016 1148   GFRAA >60 04/22/2016 1148   Lipase     Component Value Date/Time   LIPASE 10 (L) 04/19/2016 1235       Studies/Results: No results  found.  Anti-infectives: Anti-infectives    Start     Dose/Rate Route Frequency Ordered Stop   04/20/16 0830  piperacillin-tazobactam (ZOSYN) IVPB 3.375 g     3.375 g 12.5 mL/hr over 240 Minutes Intravenous Every 8 hours 04/20/16 0825     04/20/16 0230  ciprofloxacin (CIPRO) IVPB 400 mg  Status:  Discontinued     400 mg 200 mL/hr over 60 Minutes Intravenous Every 12 hours 04/19/16 1938 04/20/16 0812   04/20/16 0000  metroNIDAZOLE (FLAGYL) IVPB 500 mg  Status:  Discontinued     500 mg 100 mL/hr over 60 Minutes Intravenous Every 8 hours 04/19/16 1938 04/20/16 0812   04/19/16 1430  metroNIDAZOLE (FLAGYL) IVPB 500 mg     500 mg 100 mL/hr over 60 Minutes Intravenous  Once 04/19/16 1428 04/19/16 1543   04/19/16 1430  ciprofloxacin (CIPRO) tablet 500 mg     500 mg Oral  Once 04/19/16 1428 04/19/16 1434       Assessment/Plan  Acute colitis/diverticulitis with microperforation-no evidence of sepsis WBC 9.9 on 04/21/16, pending CBC today Afebrile, VSS  Continue IV abx Clear liquids               Dementia Hypothyroidism Essential hypertension Urinary tract infection  ID:  Cipro 12/30>>12/31  Flagyl 12/30>>12/31 Zosyn 12/31>>  Plan: continue non-operative management with IV abx, clear liquid diet, pending CBC We will follow     LOS: 4 days    Jerre SimonJessica L Focht , [facility] 04/23/2016, 8:16 AM Pager: [phone] Consults: [phone] Mon-Fri 7:00 am-4:30 pm Sat-Sun 7:00 am-11:30 am

## 2016-04-24 DIAGNOSIS — K5720 Diverticulitis of large intestine with perforation and abscess without bleeding: Secondary | ICD-10-CM | POA: Diagnosis present

## 2016-04-24 MED ORDER — CLOPIDOGREL BISULFATE 75 MG PO TABS
75.0000 mg | ORAL_TABLET | Freq: Every day | ORAL | Status: DC
  Administered 2016-04-24: 75 mg via ORAL
  Filled 2016-04-24 (×2): qty 1

## 2016-04-24 NOTE — Progress Notes (Signed)
Discontinued restraints.  Patient has been sleeping all night. Bed alarm on, she is close to nurses station, and is in a camera room.  Informed MD, and will pass this information on the day nurse to inform the family.

## 2016-04-24 NOTE — Progress Notes (Signed)
[facility] Progress Note     Subjective: Pt states no pain at rest. She denies fever, chills, nausea, vomiting, BM. No new complaints.   Objective: Vital signs in last 24 hours: Temp:  [97.5 F (36.4 C)-98.8 F (37.1 C)] 98.8 F (37.1 C) (01/04 0632) Pulse Rate:  [62-78] 62 (01/04 0632) Resp:  [16-18] 18 (01/04 0632) BP: (148-202)/(71-95) 148/71 (01/04 29560632) SpO2:  [93 %-96 %] 93 % (01/04 21300632) Last BM Date: 04/24/16  Intake/Output from previous day: 01/03 0701 - 01/04 0700 In: 50 [IV Piggyback:50] Out: 300 [Urine:300] Intake/Output this shift: Total I/O In: 360 [P.O.:360] Out: -   PE: Gen:  Alert, NAD, pleasant, cooperative, sleeping in bed, easily aroused.  Lungs: effort normal Abd: Soft, ND, +BS, very mild TTP to LLQ Skin: no rashes noted, warm and dry  Lab Results:   Recent Labs  04/23/16 0835  WBC 7.7  HGB 11.4*  HCT 35.0*  PLT 270   BMET  Recent Labs  04/22/16 1148 04/23/16 0811  NA 136 140  K 3.4* 3.9  CL 104 106  CO2 20* 25  GLUCOSE 92 118*  BUN 28* 14  CREATININE 0.90 0.82  CALCIUM 8.9 9.5   PT/INR No results for input(s): LABPROT, INR in the last 72 hours. CMP     Component Value Date/Time   NA 140 04/23/2016 0811   K 3.9 04/23/2016 0811   CL 106 04/23/2016 0811   CO2 25 04/23/2016 0811   GLUCOSE 118 (H) 04/23/2016 0811   BUN 14 04/23/2016 0811   CREATININE 0.82 04/23/2016 0811   CALCIUM 9.5 04/23/2016 0811   PROT 5.8 (L) 04/20/2016 0824   ALBUMIN 3.0 (L) 04/20/2016 0824   AST 18 04/20/2016 0824   ALT 12 (L) 04/20/2016 0824   ALKPHOS 62 04/20/2016 0824   BILITOT 0.9 04/20/2016 0824   GFRNONAA >60 04/23/2016 0811   GFRAA >60 04/23/2016 0811   Lipase     Component Value Date/Time   LIPASE 10 (L) 04/19/2016 1235       Studies/Results: No results found.  Anti-infectives: Anti-infectives    Start     Dose/Rate Route Frequency Ordered Stop   04/20/16 0830  piperacillin-tazobactam (ZOSYN) IVPB 3.375 g      3.375 g 12.5 mL/hr over 240 Minutes Intravenous Every 8 hours 04/20/16 0825     04/20/16 0230  ciprofloxacin (CIPRO) IVPB 400 mg  Status:  Discontinued     400 mg 200 mL/hr over 60 Minutes Intravenous Every 12 hours 04/19/16 1938 04/20/16 0812   04/20/16 0000  metroNIDAZOLE (FLAGYL) IVPB 500 mg  Status:  Discontinued     500 mg 100 mL/hr over 60 Minutes Intravenous Every 8 hours 04/19/16 1938 04/20/16 0812   04/19/16 1430  metroNIDAZOLE (FLAGYL) IVPB 500 mg     500 mg 100 mL/hr over 60 Minutes Intravenous  Once 04/19/16 1428 04/19/16 1543   04/19/16 1430  ciprofloxacin (CIPRO) tablet 500 mg     500 mg Oral  Once 04/19/16 1428 04/19/16 1434       Assessment/Plan  Acute colitis/diverticulitis with microperforation-no evidence of sepsis WBC trending down and normal today Afebrile, VSS  Continue IV abx fulls  Dementia Hypothyroidism Essential hypertension Urinary tract infection  ID:  Cipro 12/30>>12/31  Flagyl 12/30>>12/31 Zosyn 12/31>>  Plan:continue non-operative management with IV abx, full liquid diet per primary, WBC trended down to normal. We are hopeful the pt will do well with conservative treatment and avoid surgery if possible.  We  will follow     LOS: 5 days    Jerre Simon , [facility] 04/24/2016, 12:09 PM Pager: [phone] Consults: [phone] Mon-Fri 7:00 am-4:30 pm Sat-Sun 7:00 am-11:30 am

## 2016-04-24 NOTE — Evaluation (Signed)
Occupational Therapy Evaluation Patient Details Name: Tara Hicks MRN: [id number] DOB: [birth year] Today's Date: 04/24/2016    History of Present Illness Pt is a 83 y.o. female with PMHx of Dementia, GERD, HTN, TIA, Leaky heart valve presenting with abdominal pain.   Clinical Impression   Pt unable to provide accurate PLOF information but per chart review pt requires assist with ADL PTA. Currently pt overall min assist for ADL and functional mobility. Pt found incontinent of BM upon arrival; assisted with cleaning and toileting. Anticipate pt close is at or close to her functional baseline. No further acute OT needs identified; signing off at this time. Please re-consult if needs change. Thank you for this referral.    Follow Up Recommendations  No OT follow up;Supervision/Assistance - 24 hour (return to ALF)    Equipment Recommendations  None recommended by OT    Recommendations for Other Services PT consult     Precautions / Restrictions Precautions Precautions: Fall Restrictions Weight Bearing Restrictions: No      Mobility Bed Mobility Overal bed mobility: Needs Assistance Bed Mobility: Supine to Sit;Sit to Supine     Supine to sit: Min guard;HOB elevated Sit to supine: Min guard;HOB elevated   General bed mobility comments: Min guard for safety; no physical assist requied. Increased time and HOB elevated with use of bed rails.  Transfers Overall transfer level: Needs assistance Equipment used: 1 person hand held assist Transfers: Sit to/from Stand Sit to Stand: Min assist         General transfer comment: Min hand held assist to steady with sit to stand.    Balance Overall balance assessment: Needs assistance Sitting-balance support: Feet supported;No upper extremity supported Sitting balance-Leahy Scale: Good     Standing balance support: No upper extremity supported;During functional activity Standing balance-Leahy Scale: Poor Standing balance  comment: Min assist to maintain standing balance without UE support                            ADL Overall ADL's : Needs assistance/impaired     Grooming: Minimal assistance;Standing;Wash/dry hands Grooming Details (indicate cue type and reason): Min assist for standing balance. Upper Body Bathing: Min guard;Sitting   Lower Body Bathing: Minimal assistance;Sit to/from stand   Upper Body Dressing : Minimal assistance;Sitting Upper Body Dressing Details (indicate cue type and reason): to doff/don hospital gown Lower Body Dressing: Moderate assistance;Sit to/from stand   Toilet Transfer: Minimal assistance;Ambulation;Regular Toilet   Toileting- Clothing Manipulation and Hygiene: Minimal assistance;Sit to/from stand       Functional mobility during ADLs: Minimal assistance General ADL Comments: Pt found incontinent of bowel upon arrival. Pt assisted to bathroom and cleaned.     Vision     Perception     Praxis      Pertinent Vitals/Pain Pain Assessment: No/denies pain     Hand Dominance     Extremity/Trunk Assessment Upper Extremity Assessment Upper Extremity Assessment: Generalized weakness   Lower Extremity Assessment Lower Extremity Assessment: Defer to PT evaluation   Cervical / Trunk Assessment Cervical / Trunk Assessment: Kyphotic   Communication Communication Communication: No difficulties   Cognition Arousal/Alertness: Awake/alert Behavior During Therapy: WFL for tasks assessed/performed Overall Cognitive Status: History of cognitive impairments - at baseline                     General Comments       Exercises       Shoulder  Instructions      Home Living Family/patient expects to be discharged to:: Assisted living                                 Additional Comments: Pt from [facility]F per RN.      Prior Functioning/Environment          Comments: Unsure, pt unable to provide and no family present.  Per chart review from prior admission 10/17, pt ambulates in supervised setting without AD and receives assist for ADL.        OT Problem List:     OT Treatment/Interventions:      OT Goals(Current goals can be found in the care plan section) Acute Rehab OT Goals Patient Stated Goal: none stated OT Goal Formulation: All assessment and education complete, DC therapy  OT Frequency:     Barriers to D/C:            Co-evaluation              End of Session Nurse Communication: Mobility status  Activity Tolerance: Patient tolerated treatment well Patient left: in bed;with call bell/phone within reach;with bed alarm set   Time: 1610-9604 OT Time Calculation (min): 17 min Charges:  OT General Charges $OT Visit: 1 Procedure OT Evaluation $OT Eval Moderate Complexity: 1 Procedure G-Codes:     Gaye Alken M.S., OTR/L Pager: [phone]  04/24/2016, 5:09 PM

## 2016-04-24 NOTE — Progress Notes (Signed)
Hydralazine 10 mg given at 22:20 for B/P.

## 2016-04-24 NOTE — Progress Notes (Signed)
PROGRESS NOTE                                                                                                                                                                                                             Patient Demographics:    Tara Hicks, is a 83 y.o. female, DOB - 03/26/1923, NWG:[id number]  Admit date - 04/19/2016   Admitting Physician Haydee Salter, MD  Outpatient Primary MD for the patient is Florentina Jenny, MD  LOS - 5  Outpatient Specialists: none  Chief Complaint  Patient presents with  . Abdominal Pain       Brief Narrative   83 year old female with moderate dementia, hypertension, hypothyroidism history of TIA resident of [facility] assisted living presented to the ED with abdominal pain. History limited due to dementia. Vitals were stable. CT scan of the abdomen done showed thick-walled distal descending colon with perforation. Admitted to hospitalist service and surgery consulted.   Subjective:   Got haldol overnight for agitation, better this am, tolerating liquids, had a small Bm today   Assessment  & Plan :   Acute diverticulitis/colitis with microperforation -improving with conservative management-empiric IV Zosyn, IVF, bowel rest -CCS following -per son, colonoscopy within last 5years: unremarkable -tolerating clears, BM today, WBC down, will advance to full liquids -OOB/Ambulate, PT/OT  Essential hypertension -stable, on lisinopril  History of TIA -resume plavix  Dementia, moderate with acute delirium -Resides at ALF, Per son, pt recognizes some of the family members, capable of most of her ADLs. -may need short term SNF, Pt/OT evals pending -haldol PRN for agitation and low dose seroquel QHS -son reports increased agitation with ativan  Hypokalemia -Repleted  Acute kidney injury -due to decreased PO, resolved with IVF  DVT proph: lovenox  Code Status : DO NOT  RESUSCITATE Family Communication  : son at bedside Disposition Plan  : Pending hospital course and clinical improvement  Consults  :   CCS  Procedures  :  CT abdomen and pelvis   Lab Results  Component Value Date   PLT 270 04/23/2016    Antibiotics  :    Anti-infectives    Start     Dose/Rate Route Frequency Ordered Stop   04/20/16 0830  piperacillin-tazobactam (ZOSYN) IVPB 3.375 g     3.375 g 12.5 mL/hr over 240 Minutes Intravenous Every  8 hours 04/20/16 0825     04/20/16 0230  ciprofloxacin (CIPRO) IVPB 400 mg  Status:  Discontinued     400 mg 200 mL/hr over 60 Minutes Intravenous Every 12 hours 04/19/16 1938 04/20/16 0812   04/20/16 0000  metroNIDAZOLE (FLAGYL) IVPB 500 mg  Status:  Discontinued     500 mg 100 mL/hr over 60 Minutes Intravenous Every 8 hours 04/19/16 1938 04/20/16 0812   04/19/16 1430  metroNIDAZOLE (FLAGYL) IVPB 500 mg     500 mg 100 mL/hr over 60 Minutes Intravenous  Once 04/19/16 1428 04/19/16 1543   04/19/16 1430  ciprofloxacin (CIPRO) tablet 500 mg     500 mg Oral  Once 04/19/16 1428 04/19/16 1434        Objective:   Vitals:   04/23/16 0632 04/23/16 1622 04/23/16 2220 04/24/16 0632  BP: (!) 167/73 (!) 202/90 (!) 199/95 (!) 148/71  Pulse: (!) 58 78 65 62  Resp: 18 16 17 18   Temp: 97.7 F (36.5 C) 97.5 F (36.4 C) 98.2 F (36.8 C) 98.8 F (37.1 C)  TempSrc: Oral Oral Oral Oral  SpO2: 94% 94% 96% 93%  Weight:      Height:        Wt Readings from Last 3 Encounters:  04/20/16 53.7 kg (118 lb 4.8 oz)  01/28/16 53.9 kg (118 lb 13.3 oz)  11/08/15 54 kg (119 lb)     Intake/Output Summary (Last 24 hours) at 04/24/16 1152 Last data filed at 04/24/16 0730  Gross per 24 hour  Intake              410 ml  Output              100 ml  Net              310 ml     Physical Exam  Gen: Alert, awake, oriented to self only, pleasant, confused abt other questions HEENT:moist mucosa, supple neck Chest: CTAB CVS: N S1&S2, no murmurs,  GI:  soft, non tender now, Nondistended, bowel sounds increased Musculoskeletal: warm, no edema     Data Review:    CBC  Recent Labs Lab 04/19/16 1235 04/20/16 0824 04/21/16 0420 04/23/16 0835  WBC 7.7 10.7* 9.9 7.7  HGB 12.4 10.9* 10.0* 11.4*  HCT 38.8 34.1* 31.3* 35.0*  PLT 233 222 210 270  MCV 82.2 82.2 84.1 81.2  MCH 26.3 26.3 26.9 26.5  MCHC 32.0 32.0 31.9 32.6  RDW 13.7 13.9 14.1 13.2  LYMPHSABS 1.4  --   --   --   MONOABS 0.8  --   --   --   EOSABS 0.0  --   --   --   BASOSABS 0.0  --   --   --     Chemistries   Recent Labs Lab 04/19/16 1235 04/20/16 0824 04/22/16 1148 04/23/16 0811  NA 137 137 136 140  K 4.6 3.8 3.4* 3.9  CL 101 103 104 106  CO2 26 23 20* 25  GLUCOSE 109* 113* 92 118*  BUN 21* 23* 28* 14  CREATININE 0.90 1.17* 0.90 0.82  CALCIUM 9.9 9.0 8.9 9.5  MG  --  1.8  --   --   AST 18 18  --   --   ALT 14 12*  --   --   ALKPHOS 74 62  --   --   BILITOT 1.0 0.9  --   --    ------------------------------------------------------------------------------------------------------------------ No results for  input(s): CHOL, HDL, LDLCALC, TRIG, CHOLHDL, LDLDIRECT in the last 72 hours.  No results found for: HGBA1C ------------------------------------------------------------------------------------------------------------------ No results for input(s): TSH, T4TOTAL, T3FREE, THYROIDAB in the last 72 hours.  Invalid input(s): FREET3 ------------------------------------------------------------------------------------------------------------------ No results for input(s): VITAMINB12, FOLATE, FERRITIN, TIBC, IRON, RETICCTPCT in the last 72 hours.  Coagulation profile  Recent Labs Lab 04/20/16 0824  INR 1.32    No results for input(s): DDIMER in the last 72 hours.  Cardiac Enzymes No results for input(s): CKMB, TROPONINI, MYOGLOBIN in the last 168 hours.  Invalid input(s):  CK ------------------------------------------------------------------------------------------------------------------ No results found for: BNP  Inpatient Medications  Scheduled Meds: . donepezil  10 mg Oral QHS  . heparin  5,000 Units Subcutaneous Q8H  . levothyroxine  50 mcg Oral Once per day on Mon Wed Fri  . piperacillin-tazobactam (ZOSYN)  IV  3.375 g Intravenous Q8H  . QUEtiapine  12.5 mg Oral QHS   Continuous Infusions:  PRN Meds:.acetaminophen **OR** acetaminophen, haloperidol lactate, hydrALAZINE, morphine injection  Micro Results Recent Results (from the past 240 hour(s))  MRSA PCR Screening     Status: None   Collection Time: 04/20/16 12:24 AM  Result Value Ref Range Status   MRSA by PCR NEGATIVE NEGATIVE Final    Comment:        The GeneXpert MRSA Assay (FDA approved for NASAL specimens only), is one component of a comprehensive MRSA colonization surveillance program. It is not intended to diagnose MRSA infection nor to guide or monitor treatment for MRSA infections.   Culture, Urine     Status: None   Collection Time: 04/21/16  4:22 PM  Result Value Ref Range Status   Specimen Description URINE, CATHETERIZED  Final   Special Requests NONE  Final   Culture NO GROWTH  Final   Report Status 04/22/2016 FINAL  Final    Radiology Reports Ct Abdomen Pelvis W Contrast  Result Date: 04/19/2016 CLINICAL DATA:  Bilateral lower quadrant tenderness for 1 day EXAM: CT ABDOMEN AND PELVIS WITH CONTRAST TECHNIQUE: Multidetector CT imaging of the abdomen and pelvis was performed using the standard protocol following bolus administration of intravenous contrast. CONTRAST:  100mL ISOVUE-300 IOPAMIDOL (ISOVUE-300) INJECTION 61% COMPARISON:  May 27, 2009 FINDINGS: Lower chest: No acute abnormality. Hepatobiliary: There is diffuse low density of liver without vessel displacement. No focal liver lesion is identified. There are gallstones within the gallbladder. The common  bowel duct measures 7 mm at the pancreatic head upper limits are normal for patient age. Pancreas: Unremarkable. No pancreatic ductal dilatation or surrounding inflammatory changes. Spleen: Normal in size without focal abnormality. Adrenals/Urinary Tract: Adrenal glands are unremarkable. Kidneys are normal, without renal calculi, focal lesion, or hydronephrosis. Bladder is unremarkable. Stomach/Bowel: There are abnormal thick wall small bowel loops in the pelvis. There is 3 cm abnormal circumferential the wall of the distal descending colon. There is a 4 mm focus air which may be extraluminal adjacent to the abnormal thick wall distal descending colon. There is stranding inflammation surrounding the descending colon more prominently in the distal descending colon. There is a large hiatal hernia. Vascular/Lymphatic: Aortic atherosclerosis. No enlarged abdominal or pelvic lymph nodes. Reproductive: Uterus and bilateral adnexa are unremarkable. Other: Small amount free fluid is identified in the pelvis. Musculoskeletal: Degenerative joint changes of the spine are noted. IMPRESSION: 3 cm length of abnormal circumferential thick walled distal descending colon with adjacent focus air which may be extraluminal. Findings could be due to for colonic neoplasm with focal perforation. There are abnormal thick  wall small bowel loops in the pelvis which may be inflammatory. These results will be called to the ordering clinician or representative by the Radiologist Assistant, and communication documented in the PACS or zVision Dashboard. Electronically Signed   By: Sherian Rein M.D.   On: 04/19/2016 14:13    Time Spent in minutes  25   Chen Holzman M.D on 04/24/2016 at 11:52 AM  Between 7am to 7pm - Pager - [phone]  After 7pm go to [email or web address] - password [facility]  [facility] -  Office  [phone]

## 2016-04-25 ENCOUNTER — Inpatient Hospital Stay (HOSPITAL_COMMUNITY): Payer: Medicare Other

## 2016-04-25 ENCOUNTER — Encounter (HOSPITAL_COMMUNITY): Payer: Self-pay | Admitting: Radiology

## 2016-04-25 DIAGNOSIS — R1084 Generalized abdominal pain: Secondary | ICD-10-CM

## 2016-04-25 LAB — BASIC METABOLIC PANEL
Anion gap: 12 (ref 5–15)
BUN: 11 mg/dL (ref 6–20)
CHLORIDE: 100 mmol/L — AB (ref 101–111)
CO2: 24 mmol/L (ref 22–32)
CREATININE: 0.96 mg/dL (ref 0.44–1.00)
Calcium: 9.3 mg/dL (ref 8.9–10.3)
GFR calc Af Amer: 58 mL/min — ABNORMAL LOW (ref >60)
GFR, EST NON AFRICAN AMERICAN: 50 mL/min — AB (ref >60)
Glucose, Bld: 111 mg/dL — ABNORMAL HIGH (ref 65–99)
Potassium: 3.2 mmol/L — ABNORMAL LOW (ref 3.5–5.1)
SODIUM: 136 mmol/L (ref 135–145)

## 2016-04-25 LAB — CBC
HCT: 38 % (ref 36.0–46.0)
Hemoglobin: 12.3 g/dL (ref 12.0–15.0)
MCH: 25.9 pg — ABNORMAL LOW (ref 26.0–34.0)
MCHC: 32.4 g/dL (ref 30.0–36.0)
MCV: 80.2 fL (ref 78.0–100.0)
PLATELETS: 306 10*3/uL (ref 150–400)
RBC: 4.74 MIL/uL (ref 3.87–5.11)
RDW: 13.2 % (ref 11.5–15.5)
WBC: 11.6 10*3/uL — AB (ref 4.0–10.5)

## 2016-04-25 LAB — PROTIME-INR
INR: 1.08
PROTHROMBIN TIME: 14.1 s (ref 11.4–15.2)

## 2016-04-25 MED ORDER — LIDOCAINE HCL (PF) 1 % IJ SOLN
INTRAMUSCULAR | Status: AC
  Administered 2016-04-25: 14:00:00
  Filled 2016-04-25: qty 30

## 2016-04-25 MED ORDER — FENTANYL CITRATE (PF) 100 MCG/2ML IJ SOLN
INTRAMUSCULAR | Status: AC
  Administered 2016-04-25: 15:00:00
  Filled 2016-04-25: qty 2

## 2016-04-25 MED ORDER — MIDAZOLAM HCL 2 MG/2ML IJ SOLN
INTRAMUSCULAR | Status: AC
  Administered 2016-04-25: 15:00:00
  Filled 2016-04-25: qty 2

## 2016-04-25 MED ORDER — IOPAMIDOL (ISOVUE-300) INJECTION 61%
INTRAVENOUS | Status: AC
  Administered 2016-04-25: 75 mL
  Filled 2016-04-25: qty 75

## 2016-04-25 MED ORDER — FENTANYL CITRATE (PF) 100 MCG/2ML IJ SOLN
INTRAMUSCULAR | Status: DC | PRN
  Administered 2016-04-25: 25 ug via INTRAVENOUS
  Administered 2016-04-25: 50 ug via INTRAVENOUS

## 2016-04-25 MED ORDER — MIDAZOLAM HCL 2 MG/2ML IJ SOLN
INTRAMUSCULAR | Status: DC | PRN
  Administered 2016-04-25: 1 mg via INTRAVENOUS

## 2016-04-25 MED ORDER — IOPAMIDOL (ISOVUE-300) INJECTION 61%
INTRAVENOUS | Status: AC
  Filled 2016-04-25: qty 30

## 2016-04-25 MED ORDER — HEPARIN SODIUM (PORCINE) 5000 UNIT/ML IJ SOLN
5000.0000 [IU] | Freq: Three times a day (TID) | INTRAMUSCULAR | Status: DC
  Administered 2016-04-26 – 2016-05-01 (×17): 5000 [IU] via SUBCUTANEOUS
  Filled 2016-04-25 (×18): qty 1

## 2016-04-25 MED ORDER — POTASSIUM CHLORIDE CRYS ER 20 MEQ PO TBCR
40.0000 meq | EXTENDED_RELEASE_TABLET | Freq: Every day | ORAL | Status: DC
  Administered 2016-04-25 – 2016-05-01 (×7): 40 meq via ORAL
  Filled 2016-04-25 (×7): qty 2

## 2016-04-25 NOTE — Progress Notes (Signed)
PROGRESS NOTE                                                                                                                                                                                                             Patient Demographics:    Tara Hicks, is a 83 y.o. female, DOB - 08-May-1922, ZOX:[id number]  Admit date - 04/19/2016   Admitting Physician Haydee Salter, MD  Outpatient Primary MD for the patient is Florentina Jenny, MD  LOS - 6  Outpatient Specialists: none  Chief Complaint  Patient presents with  . Abdominal Pain       Brief Narrative   89 [facility] of [facility] moderate dementia,  hypertension,  hypothyroidism  history of TIA r presented to the ED with abdominal pain.  History limited due to dementia. Vitals were stable. C CT scan of the abdomen done showed thick-walled distal descending colon with perforation.  Admitted to hospitalist service and surgery consulted.   Subjective:   Fair, mild confused, no other issues Npo for procedure Son bedside Patient re-directable   Assessment  & Plan :   Acute diverticulitis/colitis -CT scan 1/5 repeat shows 5x3x7 abcess  -empiric IV Zosyn, IVF, bowel rest -CCS following -needs IR drainage of abcess -NPO  -morphine 2mg  q 4 prn pain  Essential hypertension -stable, on lisinopril At home which has been held for now, using hydralazine  History of TIA -holding for now plavix  Dementia, moderate with acute delirium -Resides at ALF, Per son, pt recognizes some of the family members, capable of most of her ADLs. -may need short term SNF, Pt/OT evals pending -haldol 1 mg every 6 PRN for agitation and low dose seroquel 12.5 QHS, continue Aricept 10 daily at bedtime -son reports increased agitation with ativan  Hypokalemia -Replacing with 40 mEq's daily K do  Acute kidney injury -due to decreased PO, resolved with IVF -Stable  currently  Hypothyroid Cont syntrhoid 50 m,cg  DVT proph: lovenox  Code Status : DO NOT RESUSCITATE Family Communication  : son at bedside Disposition Plan  : Pending hospital course and clinical improvement  Consults  :   CCS  Procedures  :  CT abdomen and pelvis   Lab Results  Component Value Date   PLT 306 04/25/2016    Antibiotics  :  Objective:   Vitals:   04/24/16 0632 04/24/16 1400 04/24/16 2155 04/25/16 0532  BP: (!) 148/71 136/67 (!) 147/64 (!) 159/89  Pulse: 62 72 66 68  Resp: 18 18 16 20   Temp: 98.8 F (37.1 C) 98.8 F (37.1 C) 98 F (36.7 C) 98.8 F (37.1 C)  TempSrc: Oral Oral Oral Oral  SpO2: 93% 98% 94% 98%  Weight:      Height:        Wt Readings from Last 3 Encounters:  04/20/16 53.7 kg (118 lb 4.8 oz)  01/28/16 53.9 kg (118 lb 13.3 oz)  11/08/15 54 kg (119 lb)     Intake/Output Summary (Last 24 hours) at 04/25/16 1127 Last data filed at 04/25/16 0938  Gross per 24 hour  Intake              630 ml  Output              700 ml  Net              -70 ml     Physical Exam  Gen: Alert, awake, oriented to self only, pleasant, confused abt other questions HEENT:moist mucosa, supple neck Chest: CTAB CVS: N S1&S2, no murmurs,  GI: soft, non tender now, Nondistended, bowel sounds increased Musculoskeletal: warm, no edema     Data Review:    CBC  Recent Labs Lab 04/19/16 1235 04/20/16 0824 04/21/16 0420 04/23/16 0835 04/25/16 0634  WBC 7.7 10.7* 9.9 7.7 11.6*  HGB 12.4 10.9* 10.0* 11.4* 12.3  HCT 38.8 34.1* 31.3* 35.0* 38.0  PLT 233 222 210 270 306  MCV 82.2 82.2 84.1 81.2 80.2  MCH 26.3 26.3 26.9 26.5 25.9*  MCHC 32.0 32.0 31.9 32.6 32.4  RDW 13.7 13.9 14.1 13.2 13.2  LYMPHSABS 1.4  --   --   --   --   MONOABS 0.8  --   --   --   --   EOSABS 0.0  --   --   --   --   BASOSABS 0.0  --   --   --   --     Chemistries   Recent Labs Lab 04/19/16 1235 04/20/16 0824 04/22/16 1148 04/23/16 0811 04/25/16 0634  NA  137 137 136 140 136  K 4.6 3.8 3.4* 3.9 3.2*  CL 101 103 104 106 100*  CO2 26 23 20* 25 24  GLUCOSE 109* 113* 92 118* 111*  BUN 21* 23* 28* 14 11  CREATININE 0.90 1.17* 0.90 0.82 0.96  CALCIUM 9.9 9.0 8.9 9.5 9.3  MG  --  1.8  --   --   --   AST 18 18  --   --   --   ALT 14 12*  --   --   --   ALKPHOS 74 62  --   --   --   BILITOT 1.0 0.9  --   --   --    ------------------------------------------------------------------------------------------------------------------ No results for input(s): CHOL, HDL, LDLCALC, TRIG, CHOLHDL, LDLDIRECT in the last 72 hours.  No results found for: HGBA1C ------------------------------------------------------------------------------------------------------------------ No results for input(s): TSH, T4TOTAL, T3FREE, THYROIDAB in the last 72 hours.  Invalid input(s): FREET3 ------------------------------------------------------------------------------------------------------------------ No results for input(s): VITAMINB12, FOLATE, FERRITIN, TIBC, IRON, RETICCTPCT in the last 72 hours.  Coagulation profile  Recent Labs Lab 04/20/16 0824  INR 1.32    No results for input(s): DDIMER in the last 72 hours.  Cardiac Enzymes No results for input(s): CKMB,  TROPONINI, MYOGLOBIN in the last 168 hours.  Invalid input(s): CK ------------------------------------------------------------------------------------------------------------------ No results found for: BNP  Inpatient Medications  Scheduled Meds: . donepezil  10 mg Oral QHS  . [START ON 04/26/2016] heparin  5,000 Units Subcutaneous Q8H  . iopamidol      . levothyroxine  50 mcg Oral Once per day on Mon Wed Fri  . piperacillin-tazobactam (ZOSYN)  IV  3.375 g Intravenous Q8H  . potassium chloride  40 mEq Oral Daily  . QUEtiapine  12.5 mg Oral QHS   Continuous Infusions:  PRN Meds:.acetaminophen **OR** acetaminophen, haloperidol lactate, hydrALAZINE, morphine injection  Micro Results Recent  Results (from the past 240 hour(s))  MRSA PCR Screening     Status: None   Collection Time: 04/20/16 12:24 AM  Result Value Ref Range Status   MRSA by PCR NEGATIVE NEGATIVE Final    Comment:        The GeneXpert MRSA Assay (FDA approved for NASAL specimens only), is one component of a comprehensive MRSA colonization surveillance program. It is not intended to diagnose MRSA infection nor to guide or monitor treatment for MRSA infections.   Culture, Urine     Status: None   Collection Time: 04/21/16  4:22 PM  Result Value Ref Range Status   Specimen Description URINE, CATHETERIZED  Final   Special Requests NONE  Final   Culture NO GROWTH  Final   Report Status 04/22/2016 FINAL  Final    Radiology Reports Ct Abdomen Pelvis W Contrast  Result Date: 04/25/2016 CLINICAL DATA:  Left lower quadrant pain for awhile. EXAM: CT ABDOMEN AND PELVIS WITH CONTRAST TECHNIQUE: Multidetector CT imaging of the abdomen and pelvis was performed using the standard protocol following bolus administration of intravenous contrast. CONTRAST:  75mL ISOVUE-300 IOPAMIDOL (ISOVUE-300) INJECTION 61% COMPARISON:  04/19/2016 FINDINGS: Lower chest:  Unremarkable. Hepatobiliary: No focal abnormality within the liver parenchyma. Gallbladder is distended with several gallstones identified measuring about 5 mm each. No evidence gallbladder wall thickening or pericholecystic fluid. Extrahepatic common bile duct remains mildly distended at 10 mm. No intrahepatic biliary duct dilatation. Pancreas: Pancreas is diffusely atrophic. Spleen: No splenomegaly. No focal mass lesion. Adrenals/Urinary Tract: No adrenal nodule or mass. Kidneys are unremarkable. No evidence for hydroureter. The urinary bladder appears normal for the degree of distention. Stomach/Bowel: Moderate to large hiatal hernia is evident. Duodenal diverticulum noted in the region of the ampulla. No small bowel wall thickening. No small bowel dilatation. The terminal  ileum is normal. The appendix is normal. Diverticular changes are seen scattered along the length of the colon. In the anterior left lower quadrant, in the region of the distal descending/ proximal sigmoid colon, a 4.7 x 3.0 x 6.5 cm collection of gas and fluid is identified just deep to the rectus musculature. This is probably in the preperitoneal space for contained within the inferior omentum. Adjacent tiny contained loculated sub of free air are associated and there is some gas in the subcutaneous tissues of the overlying left lower quadrant anterior abdominal wall, potentially representing dissection through the rectus sheath or potentially reflecting overlying injection sites. Vascular/Lymphatic: There is abdominal aortic atherosclerosis without aneurysm. There is no gastrohepatic or hepatoduodenal ligament lymphadenopathy. No intraperitoneal or retroperitoneal lymphadenopathy. No pelvic sidewall lymphadenopathy. Reproductive: The uterus has normal CT imaging appearance. There is no adnexal mass. Other: No intraperitoneal free fluid. Musculoskeletal: Bone windows reveal no worrisome lytic or sclerotic osseous lesions. Degenerative changes noted lower lumbar spine. IMPRESSION: 1. The wall thickening seen previously in the  distal descending colon near the junction with the sigmoid segment has decreased in the interval although pericolonic edema/inflammation does persist and there is now a 5 x 3 x 7 cm abscess just anterior to this segment of colon, localized either in the preperitoneal fat or lower omentum. Changes most likely represent perforated diverticulitis with interval development of para colonic abscess. Tiny gas bubbles are seen in the overlying subcutaneous fat, superficial to the rectus sheath, probably related to subcutaneous injection site although dissection of gas through the rectus sheath into the subcutaneous fat cannot be entirely excluded. 2.  Abdominal Aortic Atherosclerois (ICD10-170.0) 3.  Distended gallbladder with tiny calcified gallstones and slight progression of extrahepatic biliary duct dilatation. Correlation with liver function test may prove helpful. 4. Moderate hiatal hernia. Electronically Signed   By: Kennith Center M.D.   On: 04/25/2016 08:23   Ct Abdomen Pelvis W Contrast  Result Date: 04/19/2016 CLINICAL DATA:  Bilateral lower quadrant tenderness for 1 day EXAM: CT ABDOMEN AND PELVIS WITH CONTRAST TECHNIQUE: Multidetector CT imaging of the abdomen and pelvis was performed using the standard protocol following bolus administration of intravenous contrast. CONTRAST:  ISOVUE-300 IOPAMIDOL (ISOVUE-300) INJECTION 61% COMPARISON:  May 27, 2009 FINDINGS: Lower chest: No acute abnormality. Hepatobiliary: There is diffuse low density of liver without vessel displacement. No focal liver lesion is identified. There are gallstones within the gallbladder. The common bowel duct measures 7 mm at the pancreatic head upper limits are normal for patient age. Pancreas: Unremarkable. No pancreatic ductal dilatation or surrounding inflammatory changes. Spleen: Normal in size without focal abnormality. Adrenals/Urinary Tract: Adrenal glands are unremarkable. Kidneys are normal, without renal calculi, focal lesion, or hydronephrosis. Bladder is unremarkable. Stomach/Bowel: There are abnormal thick wall small bowel loops in the pelvis. There is 3 cm abnormal circumferential the wall of the distal descending colon. There is a 4 mm focus air which may be extraluminal adjacent to the abnormal thick wall distal descending colon. There is stranding inflammation surrounding the descending colon more prominently in the distal descending colon. There is a large hiatal hernia. Vascular/Lymphatic: Aortic atherosclerosis. No enlarged abdominal or pelvic lymph nodes. Reproductive: Uterus and bilateral adnexa are unremarkable. Other: Small amount free fluid is identified in the pelvis. Musculoskeletal:  Degenerative joint changes of the spine are noted. IMPRESSION: 3 cm length of abnormal circumferential thick walled distal descending colon with adjacent focus air which may be extraluminal. Findings could be due to for colonic neoplasm with focal perforation. There are abnormal thick wall small bowel loops in the pelvis which may be inflammatory. These results will be called to the ordering clinician or representative by the Radiologist Assistant, and communication documented in the PACS or zVision Dashboard. Electronically Signed   By: Sherian Rein M.D.   On: 04/19/2016 14:13    Time Spent in minutes  25   Rhetta Mura M.D on 04/25/2016 at 11:27 AM Pleas Koch, MD [facility] ([phone]   After 7pm go to [email or web address] - password [facility]  [facility] -  Office  [phone]

## 2016-04-25 NOTE — Sedation Documentation (Signed)
Patient is resting comfortably. 

## 2016-04-25 NOTE — Progress Notes (Signed)
Patient ID: Tara Hicks, female   DOB: [birth year], 83 y.o.   MRN: [id number]  [facility]     Subjective: Tired today. No new complaints. Denies abdominal pain, nausea or vomiting. Patient had multiple BM's yesterday. Repeat CT scan performed today.  Objective: Vital signs in last 24 hours: Temp:  [98 F (36.7 C)-98.8 F (37.1 C)] 98.8 F (37.1 C) (01/05 0532) Pulse Rate:  [66-72] 68 (01/05 0532) Resp:  [16-20] 20 (01/05 0532) BP: (136-159)/(64-89) 159/89 (01/05 0532) SpO2:  [94 %-98 %] 98 % (01/05 0532) Last BM Date: 04/24/16  Intake/Output from previous day: 01/04 0701 - 01/05 0700 In: 990 [P.O.:840; IV Piggyback:150] Out: 200 [Urine:200] Intake/Output this shift: Total I/O In: -  Out: 500 [Urine:500]  PE: Gen:  Alert, NAD, pleasant Pulm:  Effort normal Abd: Soft, ND, +BS, no HSM, TTP LLQ and suprapubic region Ext:  No erythema, edema, or tenderness   Lab Results:   Recent Labs  04/23/16 0835 04/25/16 0634  WBC 7.7 11.6*  HGB 11.4* 12.3  HCT 35.0* 38.0  PLT 270 306   BMET  Recent Labs  04/23/16 0811 04/25/16 0634  NA 140 136  K 3.9 3.2*  CL 106 100*  CO2 25 24  GLUCOSE 118* 111*  BUN 14 11  CREATININE 0.82 0.96  CALCIUM 9.5 9.3   PT/INR No results for input(s): LABPROT, INR in the last 72 hours. CMP     Component Value Date/Time   NA 136 04/25/2016 0634   K 3.2 (L) 04/25/2016 0634   CL 100 (L) 04/25/2016 0634   CO2 24 04/25/2016 0634   GLUCOSE 111 (H) 04/25/2016 0634   BUN 11 04/25/2016 0634   CREATININE 0.96 04/25/2016 0634   CALCIUM 9.3 04/25/2016 0634   PROT 5.8 (L) 04/20/2016 0824   ALBUMIN 3.0 (L) 04/20/2016 0824   AST 18 04/20/2016 0824   ALT 12 (L) 04/20/2016 0824   ALKPHOS 62 04/20/2016 0824   BILITOT 0.9 04/20/2016 0824   GFRNONAA 50 (L) 04/25/2016 0634   GFRAA 58 (L) 04/25/2016 0634   Lipase     Component Value Date/Time   LIPASE 10 (L) 04/19/2016 1235       Studies/Results: Ct  Abdomen Pelvis W Contrast  Result Date: 04/25/2016 CLINICAL DATA:  Left lower quadrant pain for awhile. EXAM: CT ABDOMEN AND PELVIS WITH CONTRAST TECHNIQUE: Multidetector CT imaging of the abdomen and pelvis was performed using the standard protocol following bolus administration of intravenous contrast. CONTRAST:  75mL ISOVUE-300 IOPAMIDOL (ISOVUE-300) INJECTION 61% COMPARISON:  04/19/2016 FINDINGS: Lower chest:  Unremarkable. Hepatobiliary: No focal abnormality within the liver parenchyma. Gallbladder is distended with several gallstones identified measuring about 5 mm each. No evidence gallbladder wall thickening or pericholecystic fluid. Extrahepatic common bile duct remains mildly distended at 10 mm. No intrahepatic biliary duct dilatation. Pancreas: Pancreas is diffusely atrophic. Spleen: No splenomegaly. No focal mass lesion. Adrenals/Urinary Tract: No adrenal nodule or mass. Kidneys are unremarkable. No evidence for hydroureter. The urinary bladder appears normal for the degree of distention. Stomach/Bowel: Moderate to large hiatal hernia is evident. Duodenal diverticulum noted in the region of the ampulla. No small bowel wall thickening. No small bowel dilatation. The terminal ileum is normal. The appendix is normal. Diverticular changes are seen scattered along the length of the colon. In the anterior left lower quadrant, in the region of the distal descending/ proximal sigmoid colon, a 4.7 x 3.0 x 6.5 cm collection of gas and fluid is identified just  deep to the rectus musculature. This is probably in the preperitoneal space for contained within the inferior omentum. Adjacent tiny contained loculated sub of free air are associated and there is some gas in the subcutaneous tissues of the overlying left lower quadrant anterior abdominal wall, potentially representing dissection through the rectus sheath or potentially reflecting overlying injection sites. Vascular/Lymphatic: There is abdominal aortic  atherosclerosis without aneurysm. There is no gastrohepatic or hepatoduodenal ligament lymphadenopathy. No intraperitoneal or retroperitoneal lymphadenopathy. No pelvic sidewall lymphadenopathy. Reproductive: The uterus has normal CT imaging appearance. There is no adnexal mass. Other: No intraperitoneal free fluid. Musculoskeletal: Bone windows reveal no worrisome lytic or sclerotic osseous lesions. Degenerative changes noted lower lumbar spine. IMPRESSION: 1. The wall thickening seen previously in the distal descending colon near the junction with the sigmoid segment has decreased in the interval although pericolonic edema/inflammation does persist and there is now a 5 x 3 x 7 cm abscess just anterior to this segment of colon, localized either in the preperitoneal fat or lower omentum. Changes most likely represent perforated diverticulitis with interval development of para colonic abscess. Tiny gas bubbles are seen in the overlying subcutaneous fat, superficial to the rectus sheath, probably related to subcutaneous injection site although dissection of gas through the rectus sheath into the subcutaneous fat cannot be entirely excluded. 2.  Abdominal Aortic Atherosclerois (ICD10-170.0) 3. Distended gallbladder with tiny calcified gallstones and slight progression of extrahepatic biliary duct dilatation. Correlation with liver function test may prove helpful. 4. Moderate hiatal hernia. Electronically Signed   By: Kennith Center M.D.   On: 04/25/2016 08:23    Anti-infectives: Anti-infectives    Start     Dose/Rate Route Frequency Ordered Stop   04/20/16 0830  piperacillin-tazobactam (ZOSYN) IVPB 3.375 g     3.375 g 12.5 mL/hr over 240 Minutes Intravenous Every 8 hours 04/20/16 0825     04/20/16 0230  ciprofloxacin (CIPRO) IVPB 400 mg  Status:  Discontinued     400 mg 200 mL/hr over 60 Minutes Intravenous Every 12 hours 04/19/16 1938 04/20/16 0812   04/20/16 0000  metroNIDAZOLE (FLAGYL) IVPB 500 mg   Status:  Discontinued     500 mg 100 mL/hr over 60 Minutes Intravenous Every 8 hours 04/19/16 1938 04/20/16 0812   04/19/16 1430  metroNIDAZOLE (FLAGYL) IVPB 500 mg     500 mg 100 mL/hr over 60 Minutes Intravenous  Once 04/19/16 1428 04/19/16 1543   04/19/16 1430  ciprofloxacin (CIPRO) tablet 500 mg     500 mg Oral  Once 04/19/16 1428 04/19/16 1434       Assessment/Plan Acute colitis/diverticulitis with microperforation-no evidence of sepsis - WBC up to 11.6 today, afebrile - CT today shows decreased inflammation in the distal descending colon near the junction with the sigmoid colon, there is also a new 5 x 3 x 7 cm abscess   Dementia Hypothyroidism Essential hypertension Urinary tract infection  ID:  Cipro 12/30>>12/31  Flagyl 12/30>>12/31 Zosyn 12/31>> FEN: NPO, IVF VTE: SCDs, Heparin and Plavix (held today for procedure)  Plan:IR was consulted for new abscess formation and are planning for IR drain placement. NPO for now. Continue IV antibiotics.   LOS: 6 days    Edson Snowball , [facility] 04/25/2016, 11:10 AM Pager: [phone] Consults: [phone] Mon-Fri 7:00 am-4:30 pm Sat-Sun 7:00 am-11:30 am

## 2016-04-25 NOTE — Sedation Documentation (Signed)
Patient denies pain and is resting comfortably.  

## 2016-04-25 NOTE — Evaluation (Signed)
Physical Therapy Evaluation Patient Details Name: Tara Hicks MRN: [id number] DOB: 08/12/1922 Today's Date: 04/25/2016   History of Present Illness  Pt is a 83 y.o. female with PMHx of Dementia, GERD, HTN, TIA, Leaky heart valve presenting with abdominal pain.  Clinical Impression  Pt is up to walk with PT for only a short trip, but is primarily limited by exacerbation of her pain.  Her plan is to be followed acutely and transition back to ALF with walker and follow up HHPT.  Will work on standing and gait with training for RW and will dc if not needed as she becomes more comfortable.    Follow Up Recommendations Home health PT;Supervision for mobility/OOB    Equipment Recommendations  Rolling walker with 5" wheels    Recommendations for Other Services       Precautions / Restrictions Precautions Precautions: Fall Restrictions Weight Bearing Restrictions: No      Mobility  Bed Mobility Overal bed mobility: Needs Assistance Bed Mobility: Supine to Sit;Sit to Supine     Supine to sit: Mod assist;Min assist Sit to supine: Mod assist;HOB elevated   General bed mobility comments: Pt is hurting more today and required more assistance than with OT yesterday  Transfers Overall transfer level: Needs assistance Equipment used: 1 person hand held assist Transfers: Sit to/from Stand;Stand Pivot Transfers Sit to Stand: Min assist Stand pivot transfers: Min assist       General transfer comment: cues for hand placement 100% of the time  Ambulation/Gait Ambulation/Gait assistance: Min assist Ambulation Distance (Feet): 8 Feet Assistive device: Rolling walker (2 wheeled);1 person hand held assist Gait Pattern/deviations: Step-through pattern;Step-to pattern;Trunk flexed;Wide base of support;Shuffle;Decreased stride length Gait velocity: reduced Gait velocity interpretation: Below normal speed for age/gender General Gait Details: awkward gait as pt is not used to RW but also  weaker and in pain today  Stairs            Wheelchair Mobility    Modified Rankin (Stroke Patients Only)       Balance Overall balance assessment: Needs assistance Sitting-balance support: Feet supported;Bilateral upper extremity supported Sitting balance-Leahy Scale: Fair     Standing balance support: Bilateral upper extremity supported Standing balance-Leahy Scale: Poor                               Pertinent Vitals/Pain Pain Assessment: Faces Faces Pain Scale: Hurts whole lot Pain Location: abdomen Pain Descriptors / Indicators: Aching;Heaviness Pain Intervention(s): Limited activity within patient's tolerance;Monitored during session;Premedicated before session;Repositioned    Home Living Family/patient expects to be discharged to:: Assisted living               Home Equipment: Dan HumphreysWalker - 2 wheels Additional Comments: [facility] from [facility] per RN.    Prior Function Level of Independence: Needs assistance   Gait / Transfers Assistance Needed: Ambulates independently in the supervised setting.  pt supposed to use RW, but forgets  ADL's / Homemaking Assistance Needed: assisted/supervised for bathing, dressing, ? toileting.  Comments: unsure of her PLOF due to nursing being source but no family there and pt is clearly confused     Hand Dominance        Extremity/Trunk Assessment   Upper Extremity Assessment Upper Extremity Assessment: Generalized weakness    Lower Extremity Assessment Lower Extremity Assessment: Generalized weakness    Cervical / Trunk Assessment Cervical / Trunk Assessment: Kyphotic  Communication   Communication: No difficulties  Cognition Arousal/Alertness: Awake/alert Behavior During Therapy: WFL for tasks assessed/performed Overall Cognitive Status: History of cognitive impairments - at baseline                 General Comments: nursing had administered meds IV prior to PT visit but pt still in  significant pain    General Comments      Exercises     Assessment/Plan    PT Assessment Patient needs continued PT services  PT Problem List Decreased range of motion;Decreased strength;Decreased activity tolerance;Decreased balance;Decreased mobility;Decreased coordination;Decreased cognition;Decreased knowledge of use of DME;Decreased safety awareness;Decreased knowledge of precautions;Cardiopulmonary status limiting activity;Pain (O2 sats were WFL with all mobility)          PT Treatment Interventions DME instruction;Gait training;Functional mobility training;Therapeutic activities;Therapeutic exercise;Balance training;Neuromuscular re-education;Patient/family education    PT Goals (Current goals can be found in the Care Plan section)  Acute Rehab PT Goals Patient Stated Goal: none stated PT Goal Formulation: Patient unable to participate in goal setting Time For Goal Achievement: 05/09/16 Potential to Achieve Goals: Good    Frequency Min 3X/week   Barriers to discharge   lives in ALF with staff support    Co-evaluation               End of Session Equipment Utilized During Treatment: Gait belt Activity Tolerance: Patient limited by pain Patient left: in bed;with call bell/phone within reach;with bed alarm set Nurse Communication: Mobility status         Time: 9604-5409 PT Time Calculation (min) (ACUTE ONLY): 31 min   Charges:   PT Evaluation $PT Eval Moderate Complexity: 1 Procedure PT Treatments $Gait Training: 8-22 mins   PT G Codes:        Ivar Drape 2016/05/18, 1:08 PM  Samul Dada, PT MS Acute Rehab Dept. Number: [facility] [id number] and [facility] [phone]

## 2016-04-25 NOTE — Procedures (Signed)
S/p CT LLQ diverticular abscess drain  No comp Stable Sample sent for cx Full report in PACS

## 2016-04-25 NOTE — Consult Note (Signed)
Chief Complaint: Patient was seen in consultation today for left abdomen abscess drain placement Chief Complaint  Patient presents with  . Abdominal Pain   at the request of Dr Marilynne HalstedF Byerly  Referring Physician(s): Dr Marilynne HalstedF Byerly  Supervising Physician: Ruel FavorsShick, Trevor  [facility]  History of Present Illness: Tara CopaMatilda Sanks is a 83 y.o. female   Presented to ED with Left abd pain Medical magt per [facility] Pt still painful Leukocytosis CT 1/5: IMPRESSION: 1. The wall thickening seen previously in the distal descending colon near the junction with the sigmoid segment has decreased in the interval although pericolonic edema/inflammation does persist and there is now a 5 x 3 x 7 cm abscess just anterior to this segment of colon, localized either in the preperitoneal fat or lower omentum. Changes most likely represent perforated diverticulitis with interval development of para colonic abscess. Tiny gas bubbles are seen in the overlying subcutaneous fat, superficial to the rectus sheath, probably related to subcutaneous injection site although dissection of gas through the rectus sheath into the subcutaneous fat cannot be entirely excluded. 2.  Abdominal Aortic Atherosclerois (ICD10-170.0) 3. Distended gallbladder with tiny calcified gallstones and slight progression of extrahepatic biliary duct dilatation. Correlation with liver function test may prove helpful. 4. Moderate hiatal hernia.  Request for drain placement per Dr Donell BeersByerly Dr Miles CostainShick has reviewed imaging and approves procedure Hep in now held Npo Pt ON Plavix-----collection is superficial in this thin pt---Rad and MD feels risks outweigh benefits  Past Medical History:  Diagnosis Date  . Arthritis   . Cataract   . Complication of anesthesia   . Dementia   . GERD (gastroesophageal reflux disease)   . Hypertension   . Hypothyroidism   . Leaky heart valve    Hx: of  . PONV (postoperative nausea and  vomiting)   . TIA (transient ischemic attack)    Hx: of 2014    Past Surgical History:  Procedure Laterality Date  . BREAST SURGERY     Hx: of left breast cyst removal  . CATARACT EXTRACTION    . COLONOSCOPY     Hx: of  . DILATION AND CURETTAGE OF UTERUS    . LUMBAR LAMINECTOMY/DECOMPRESSION MICRODISCECTOMY Left 01/31/2013   Procedure: Left lumbar five-sacral one laminectomy for synovial cyst;  Surgeon: Reinaldo Meekerandy O Kritzer, MD;  Location: [facility];  Service: Neurosurgery;  Laterality: Left;    Allergies: Phenergan [promethazine]  Medications: Prior to Admission medications   Medication Sig Start Date End Date Taking? Authorizing Provider  acetaminophen (TYLENOL) 325 MG tablet Take 2 tablets (650 mg total) by mouth every 6 (six) hours as needed for mild pain (or Fever >/= 101). Patient taking differently: Take 650 mg by mouth every 6 (six) hours as needed for fever (pain).  01/29/16  Yes Zannie CovePreetha Joseph, MD  clopidogrel (PLAVIX) 75 MG tablet Take 75 mg by mouth daily.   Yes Historical Provider, MD  donepezil (ARICEPT) 10 MG tablet Take 10 mg by mouth at bedtime.   Yes Historical Provider, MD  levothyroxine (SYNTHROID, LEVOTHROID) 50 MCG tablet Take 50 mcg by mouth every Monday, Wednesday, and Friday.    Yes Historical Provider, MD  Magnesium Hydroxide (MILK OF MAGNESIA PO) Take 30 mLs by mouth daily as needed (constipation).    Yes Historical Provider, MD  Melatonin 5 MG TABS Take 5 mg by mouth at bedtime.    Yes Historical Provider, MD  ondansetron (ZOFRAN) 4 MG tablet Take 4 mg by  mouth every 4 (four) hours as needed for nausea.    Yes Historical Provider, MD  QUEtiapine (SEROQUEL) 25 MG tablet Take 25 mg by mouth at bedtime.    Yes Historical Provider, MD  vitamin B-12 (CYANOCOBALAMIN) 500 MCG tablet Take 500 mcg by mouth daily.   Yes Historical Provider, MD  HYDROcodone-acetaminophen (NORCO/VICODIN) 5-325 MG tablet Take 1 tablet by mouth every 6 (six) hours as needed for moderate  pain. Patient not taking: Reported on 04/19/2016 01/29/16   Zannie Cove, MD     Family History  Problem Relation Age of Onset  . Diabetes Brother   . Hypertension Brother   . Cancer - Colon Sister     Social History   Social History  . Marital status: Single    Spouse name: N/A  . Number of children: N/A  . Years of education: N/A   Social History Main Topics  . Smoking status: Never Smoker  . Smokeless tobacco: Never Used  . Alcohol use No  . Drug use: No  . Sexual activity: Not Asked   Other Topics Concern  . None   Social History Narrative  . None    Review of Systems: A 12 point ROS discussed and pertinent positives are indicated in the HPI above.  All other systems are negative.  Review of Systems  Constitutional: Positive for activity change and fatigue. Negative for appetite change and fever.  Respiratory: Negative for shortness of breath.   Gastrointestinal: Positive for abdominal pain and nausea.  Neurological: Positive for weakness.  Psychiatric/Behavioral: Positive for confusion. Negative for behavioral problems.    Vital Signs: BP (!) 159/89 (BP Location: Right Arm)   Pulse 68   Temp 98.8 F (37.1 C) (Oral)   Resp 20   Ht 5\' 4"  (1.626 m)   Wt 118 lb 4.8 oz (53.7 kg)   SpO2 98%   BMI 20.31 kg/m   Physical Exam  Cardiovascular: Normal rate and regular rhythm.   Pulmonary/Chest: Effort normal.  Abdominal: Soft. There is tenderness.  Musculoskeletal: Normal range of motion.  Neurological: She is alert.  Skin: Skin is warm and dry.  Psychiatric:  Confusion Consented with son Tim via phone  Nursing note and vitals reviewed.   Mallampati Score:  MD Evaluation Airway: WNL Heart: WNL Abdomen: WNL ASA  Classification: 3 Mallampati/Airway Score: One  Imaging: Ct Abdomen Pelvis W Contrast  Result Date: 04/25/2016 CLINICAL DATA:  Left lower quadrant pain for awhile. EXAM: CT ABDOMEN AND PELVIS WITH CONTRAST TECHNIQUE: Multidetector CT  imaging of the abdomen and pelvis was performed using the standard protocol following bolus administration of intravenous contrast. CONTRAST:  75mL ISOVUE-300 IOPAMIDOL (ISOVUE-300) INJECTION 61% COMPARISON:  04/19/2016 FINDINGS: Lower chest:  Unremarkable. Hepatobiliary: No focal abnormality within the liver parenchyma. Gallbladder is distended with several gallstones identified measuring about 5 mm each. No evidence gallbladder wall thickening or pericholecystic fluid. Extrahepatic common bile duct remains mildly distended at 10 mm. No intrahepatic biliary duct dilatation. Pancreas: Pancreas is diffusely atrophic. Spleen: No splenomegaly. No focal mass lesion. Adrenals/Urinary Tract: No adrenal nodule or mass. Kidneys are unremarkable. No evidence for hydroureter. The urinary bladder appears normal for the degree of distention. Stomach/Bowel: Moderate to large hiatal hernia is evident. Duodenal diverticulum noted in the region of the ampulla. No small bowel wall thickening. No small bowel dilatation. The terminal ileum is normal. The appendix is normal. Diverticular changes are seen scattered along the length of the colon. In the anterior left lower quadrant, in the  region of the distal descending/ proximal sigmoid colon, a 4.7 x 3.0 x 6.5 cm collection of gas and fluid is identified just deep to the rectus musculature. This is probably in the preperitoneal space for contained within the inferior omentum. Adjacent tiny contained loculated sub of free air are associated and there is some gas in the subcutaneous tissues of the overlying left lower quadrant anterior abdominal wall, potentially representing dissection through the rectus sheath or potentially reflecting overlying injection sites. Vascular/Lymphatic: There is abdominal aortic atherosclerosis without aneurysm. There is no gastrohepatic or hepatoduodenal ligament lymphadenopathy. No intraperitoneal or retroperitoneal lymphadenopathy. No pelvic sidewall  lymphadenopathy. Reproductive: The uterus has normal CT imaging appearance. There is no adnexal mass. Other: No intraperitoneal free fluid. Musculoskeletal: Bone windows reveal no worrisome lytic or sclerotic osseous lesions. Degenerative changes noted lower lumbar spine. IMPRESSION: 1. The wall thickening seen previously in the distal descending colon near the junction with the sigmoid segment has decreased in the interval although pericolonic edema/inflammation does persist and there is now a 5 x 3 x 7 cm abscess just anterior to this segment of colon, localized either in the preperitoneal fat or lower omentum. Changes most likely represent perforated diverticulitis with interval development of para colonic abscess. Tiny gas bubbles are seen in the overlying subcutaneous fat, superficial to the rectus sheath, probably related to subcutaneous injection site although dissection of gas through the rectus sheath into the subcutaneous fat cannot be entirely excluded. 2.  Abdominal Aortic Atherosclerois (ICD10-170.0) 3. Distended gallbladder with tiny calcified gallstones and slight progression of extrahepatic biliary duct dilatation. Correlation with liver function test may prove helpful. 4. Moderate hiatal hernia. Electronically Signed   By: Kennith Center M.D.   On: 04/25/2016 08:23   Ct Abdomen Pelvis W Contrast  Result Date: 04/19/2016 CLINICAL DATA:  Bilateral lower quadrant tenderness for 1 day EXAM: CT ABDOMEN AND PELVIS WITH CONTRAST TECHNIQUE: Multidetector CT imaging of the abdomen and pelvis was performed using the standard protocol following bolus administration of intravenous contrast. CONTRAST:  ISOVUE-300 IOPAMIDOL (ISOVUE-300) INJECTION 61% COMPARISON:  May 27, 2009 FINDINGS: Lower chest: No acute abnormality. Hepatobiliary: There is diffuse low density of liver without vessel displacement. No focal liver lesion is identified. There are gallstones within the gallbladder. The common bowel  duct measures 7 mm at the pancreatic head upper limits are normal for patient age. Pancreas: Unremarkable. No pancreatic ductal dilatation or surrounding inflammatory changes. Spleen: Normal in size without focal abnormality. Adrenals/Urinary Tract: Adrenal glands are unremarkable. Kidneys are normal, without renal calculi, focal lesion, or hydronephrosis. Bladder is unremarkable. Stomach/Bowel: There are abnormal thick wall small bowel loops in the pelvis. There is 3 cm abnormal circumferential the wall of the distal descending colon. There is a 4 mm focus air which may be extraluminal adjacent to the abnormal thick wall distal descending colon. There is stranding inflammation surrounding the descending colon more prominently in the distal descending colon. There is a large hiatal hernia. Vascular/Lymphatic: Aortic atherosclerosis. No enlarged abdominal or pelvic lymph nodes. Reproductive: Uterus and bilateral adnexa are unremarkable. Other: Small amount free fluid is identified in the pelvis. Musculoskeletal: Degenerative joint changes of the spine are noted. IMPRESSION: 3 cm length of abnormal circumferential thick walled distal descending colon with adjacent focus air which may be extraluminal. Findings could be due to for colonic neoplasm with focal perforation. There are abnormal thick wall small bowel loops in the pelvis which may be inflammatory. These results will be called to the ordering clinician  or representative by the Radiologist Assistant, and communication documented in the PACS or zVision Dashboard. Electronically Signed   By: Sherian Rein M.D.   On: 04/19/2016 14:13    Labs:  CBC:  Recent Labs  04/20/16 0824 04/21/16 0420 04/23/16 0835 04/25/16 0634  WBC 10.7* 9.9 7.7 11.6*  HGB 10.9* 10.0* 11.4* 12.3  HCT 34.1* 31.3* 35.0* 38.0  PLT 222 210 270 306    COAGS:  Recent Labs  04/20/16 0824  INR 1.32    BMP:  Recent Labs  04/20/16 0824 04/22/16 1148 04/23/16 0811  04/25/16 0634  NA 137 136 140 136  K 3.8 3.4* 3.9 3.2*  CL 103 104 106 100*  CO2 23 20* 25 24  GLUCOSE 113* 92 118* 111*  BUN 23* 28* 14 11  CALCIUM 9.0 8.9 9.5 9.3  CREATININE 1.17* 0.90 0.82 0.96  GFRNONAA 39* 54* >60 50*  GFRAA 46* >60 >60 58*    LIVER FUNCTION TESTS:  Recent Labs  04/19/16 1235 04/20/16 0824  BILITOT 1.0 0.9  AST 18 18  ALT 14 12*  ALKPHOS 74 62  PROT 6.9 5.8*  ALBUMIN 4.0 3.0*    TUMOR MARKERS: No results for input(s): AFPTM, CEA, CA199, CHROMGRNA in the last 8760 hours.  Assessment and Plan:  TIA/CVA- on Plavix LLQ abd pain Continue pain and new leukocytosis +collection per CT Now scheduled for abscess drain placement Risks and Benefits discussed with the patient and son via phone including bleeding, infection, damage to adjacent structures, bowel perforation/fistula connection, and sepsis. All of the sons questions were answered, he is agreeable to proceed. Consent signed and in chart.   Thank you for this interesting consult.  I greatly enjoyed meeting Tawan Corkern and look forward to participating in their care.  A copy of this report was sent to the requesting provider on this date.  Electronically Signed: Ralene Muskrat A 04/25/2016, 10:28 AM   I spent a total of 40 Minutes    in face to face in clinical consultation, greater than 50% of which was counseling/coordinating care for LLQ abscess drain placement

## 2016-04-25 NOTE — Sedation Documentation (Signed)
Reported given to [employer] Kelly RN

## 2016-04-26 LAB — BASIC METABOLIC PANEL
ANION GAP: 9 (ref 5–15)
BUN: 11 mg/dL (ref 6–20)
CHLORIDE: 104 mmol/L (ref 101–111)
CO2: 25 mmol/L (ref 22–32)
Calcium: 9 mg/dL (ref 8.9–10.3)
Creatinine, Ser: 0.87 mg/dL (ref 0.44–1.00)
GFR calc non Af Amer: 57 mL/min — ABNORMAL LOW (ref >60)
Glucose, Bld: 87 mg/dL (ref 65–99)
Potassium: 3.5 mmol/L (ref 3.5–5.1)
Sodium: 138 mmol/L (ref 135–145)

## 2016-04-26 LAB — CBC
HCT: 34.4 % — ABNORMAL LOW (ref 36.0–46.0)
HEMOGLOBIN: 11 g/dL — AB (ref 12.0–15.0)
MCH: 26.1 pg (ref 26.0–34.0)
MCHC: 32 g/dL (ref 30.0–36.0)
MCV: 81.7 fL (ref 78.0–100.0)
Platelets: 277 10*3/uL (ref 150–400)
RBC: 4.21 MIL/uL (ref 3.87–5.11)
RDW: 13.6 % (ref 11.5–15.5)
WBC: 10.3 10*3/uL (ref 4.0–10.5)

## 2016-04-26 MED ORDER — ONDANSETRON HCL 4 MG/2ML IJ SOLN
4.0000 mg | Freq: Four times a day (QID) | INTRAMUSCULAR | Status: DC
  Administered 2016-04-26: 4 mg via INTRAVENOUS
  Filled 2016-04-26: qty 2

## 2016-04-26 MED ORDER — ONDANSETRON HCL 4 MG/2ML IJ SOLN
4.0000 mg | Freq: Four times a day (QID) | INTRAMUSCULAR | Status: DC
  Administered 2016-04-26 – 2016-05-01 (×15): 4 mg via INTRAVENOUS
  Filled 2016-04-26 (×17): qty 2

## 2016-04-26 MED ORDER — CLOPIDOGREL BISULFATE 75 MG PO TABS
75.0000 mg | ORAL_TABLET | Freq: Every day | ORAL | Status: DC
  Administered 2016-04-26 – 2016-05-01 (×6): 75 mg via ORAL
  Filled 2016-04-26 (×6): qty 1

## 2016-04-26 MED ORDER — AMLODIPINE BESYLATE 5 MG PO TABS
5.0000 mg | ORAL_TABLET | Freq: Every day | ORAL | Status: DC
  Administered 2016-04-26 – 2016-05-01 (×6): 5 mg via ORAL
  Filled 2016-04-26 (×6): qty 1

## 2016-04-26 NOTE — Progress Notes (Signed)
Referring Physician(s): Dr. Almond Lint  Supervising Physician: Ruel Favors  Patient Status:  [facility] - In-pt  Chief Complaint: S/p LLQ diverticular abscess drain  Subjective: Pt feeling a little better today Started on full liquids, tolerating okay. Some soreness at drain site as expected  Allergies: Phenergan [promethazine]  Medications:  Current Facility-Administered Medications:  .  acetaminophen (TYLENOL) tablet 650 mg, 650 mg, Oral, Q6H PRN, 650 mg at 04/25/16 2250 **OR** acetaminophen (TYLENOL) suppository 650 mg, 650 mg, Rectal, Q6H PRN, Kerrin Mo, MD .  donepezil (ARICEPT) tablet 10 mg, 10 mg, Oral, QHS, Nishant Dhungel, MD, 10 mg at 04/25/16 2250 .  fentaNYL (SUBLIMAZE) injection, , Intravenous, PRN, Berdine Dance, MD, 50 mcg at 04/25/16 1457 .  haloperidol lactate (HALDOL) injection 1 mg, 1 mg, Intravenous, Q6H PRN, Nishant Dhungel, MD, 1 mg at 04/23/16 1354 .  heparin injection 5,000 Units, 5,000 Units, Subcutaneous, Q8H, Ralene Muskrat, PA-C, 5,000 Units at 04/26/16 0646 .  hydrALAZINE (APRESOLINE) tablet 10 mg, 10 mg, Oral, Q8H PRN, Kerrin Mo, MD, 10 mg at 04/23/16 2339 .  levothyroxine (SYNTHROID, LEVOTHROID) tablet 50 mcg, 50 mcg, Oral, Once per day on Mon Wed Fri, Nishant Dhungel, MD, 50 mcg at 04/25/16 1016 .  midazolam (VERSED) injection, , Intravenous, PRN, Berdine Dance, MD, 1 mg at 04/25/16 1456 .  morphine 2 MG/ML injection 2 mg, 2 mg, Intravenous, Q4H PRN, Kerrin Mo, MD, 2 mg at 04/26/16 4098 .  piperacillin-tazobactam (ZOSYN) IVPB 3.375 g, 3.375 g, Intravenous, Q8H, Nishant Dhungel, MD, 3.375 g at 04/26/16 0232 .  potassium chloride SA (K-DUR,KLOR-CON) CR tablet 40 mEq, 40 mEq, Oral, Daily, Rhetta Mura, MD, 40 mEq at 04/26/16 0903 .  QUEtiapine (SEROQUEL) tablet 12.5 mg, 12.5 mg, Oral, QHS, Nishant Dhungel, MD, 12.5 mg at 04/25/16 2250    Vital Signs: BP (!) 170/72 (BP Location: Right Arm)   Pulse 72   Temp 98.3 F (36.8 C) (Oral)    Resp 18   Ht 5\' 4"  (1.626 m)   Wt 118 lb 4.8 oz (53.7 kg)   SpO2 92%   BMI 20.31 kg/m   Physical Exam LLQ drain intact, site clean, minimally tender Output mostly thin clear brown though some debris in line.   Imaging: Ct Abdomen Pelvis W Contrast  Result Date: 04/25/2016 CLINICAL DATA:  Left lower quadrant pain for awhile. EXAM: CT ABDOMEN AND PELVIS WITH CONTRAST TECHNIQUE: Multidetector CT imaging of the abdomen and pelvis was performed using the standard protocol following bolus administration of intravenous contrast. CONTRAST:  75mL ISOVUE-300 IOPAMIDOL (ISOVUE-300) INJECTION 61% COMPARISON:  04/19/2016 FINDINGS: Lower chest:  Unremarkable. Hepatobiliary: No focal abnormality within the liver parenchyma. Gallbladder is distended with several gallstones identified measuring about 5 mm each. No evidence gallbladder wall thickening or pericholecystic fluid. Extrahepatic common bile duct remains mildly distended at 10 mm. No intrahepatic biliary duct dilatation. Pancreas: Pancreas is diffusely atrophic. Spleen: No splenomegaly. No focal mass lesion. Adrenals/Urinary Tract: No adrenal nodule or mass. Kidneys are unremarkable. No evidence for hydroureter. The urinary bladder appears normal for the degree of distention. Stomach/Bowel: Moderate to large hiatal hernia is evident. Duodenal diverticulum noted in the region of the ampulla. No small bowel wall thickening. No small bowel dilatation. The terminal ileum is normal. The appendix is normal. Diverticular changes are seen scattered along the length of the colon. In the anterior left lower quadrant, in the region of the distal descending/ proximal sigmoid colon, a 4.7 x 3.0 x 6.5 cm collection of gas and fluid  is identified just deep to the rectus musculature. This is probably in the preperitoneal space for contained within the inferior omentum. Adjacent tiny contained loculated sub of free air are associated and there is some gas in the subcutaneous  tissues of the overlying left lower quadrant anterior abdominal wall, potentially representing dissection through the rectus sheath or potentially reflecting overlying injection sites. Vascular/Lymphatic: There is abdominal aortic atherosclerosis without aneurysm. There is no gastrohepatic or hepatoduodenal ligament lymphadenopathy. No intraperitoneal or retroperitoneal lymphadenopathy. No pelvic sidewall lymphadenopathy. Reproductive: The uterus has normal CT imaging appearance. There is no adnexal mass. Other: No intraperitoneal free fluid. Musculoskeletal: Bone windows reveal no worrisome lytic or sclerotic osseous lesions. Degenerative changes noted lower lumbar spine. IMPRESSION: 1. The wall thickening seen previously in the distal descending colon near the junction with the sigmoid segment has decreased in the interval although pericolonic edema/inflammation does persist and there is now a 5 x 3 x 7 cm abscess just anterior to this segment of colon, localized either in the preperitoneal fat or lower omentum. Changes most likely represent perforated diverticulitis with interval development of para colonic abscess. Tiny gas bubbles are seen in the overlying subcutaneous fat, superficial to the rectus sheath, probably related to subcutaneous injection site although dissection of gas through the rectus sheath into the subcutaneous fat cannot be entirely excluded. 2.  Abdominal Aortic Atherosclerois (ICD10-170.0) 3. Distended gallbladder with tiny calcified gallstones and slight progression of extrahepatic biliary duct dilatation. Correlation with liver function test may prove helpful. 4. Moderate hiatal hernia. Electronically Signed   By: Kennith CenterEric  Mansell M.D.   On: 04/25/2016 08:23   Ct Image Guided Drainage By Percutaneous Catheter  Result Date: 04/25/2016 INDICATION: LEFT LOWER QUADRANT DIVERTICULAR ABSCESS EXAM: CT GUIDED DRAINAGE OF DIVERTICULAR ABSCESS MEDICATIONS: The patient is currently admitted to the  hospital and receiving intravenous antibiotics. The antibiotics were administered within an appropriate time frame prior to the initiation of the procedure. ANESTHESIA/SEDATION: 1.0 mg IV Versed 75 mcg IV Fentanyl Moderate Sedation Time:  13 MINUTES The patient was continuously monitored during the procedure by the interventional radiology nurse under my direct supervision. COMPLICATIONS: None immediate. TECHNIQUE: Informed written consent was obtained from the patient after a thorough discussion of the procedural risks, benefits and alternatives. All questions were addressed. Maximal Sterile Barrier Technique was utilized including caps, mask, sterile gowns, sterile gloves, sterile drape, hand hygiene and skin antiseptic. A timeout was performed prior to the initiation of the procedure. PROCEDURE: previous imaging reviewed. patient positioned supine. noncontrast localization ct performed. the left lower quadrant anterior abdominal abscess was localized with ct. overlying skin marked. under sterile conditions and local anesthesia, an 18 gauge 10 cm introducer needle was advanced from an anterior oblique approach into the left lower quadrant abdominal abscess. needle position confirmed with ct. syringe aspiration yielded exudative fecal contaminated fluid. sample sent for gram stain and culture. guidewire inserted followed by tract dilatation to insert a 10 french drain. drain catheter position confirmed with ct. catheter secured with a prolene suture and a sterile dressing. drain catheter connected to external suction bulb. patient tolerated the procedure well. No immediate complication. FINDINGS: CT imaging confirms needle placement in the left lower quadrant anterior diverticular abscess for drain placement IMPRESSION: Successful CT-guided left lower quadrant diverticular abscess drain insertion. Electronically Signed   By: Judie PetitM.  Shick M.D.   On: 04/25/2016 15:41    Labs:  CBC:  Recent Labs  04/21/16 0420  04/23/16 0835 04/25/16 0634 04/26/16 0822  WBC 9.9  7.7 11.6* 10.3  HGB 10.0* 11.4* 12.3 11.0*  HCT 31.3* 35.0* 38.0 34.4*  PLT 210 270 306 277    COAGS:  Recent Labs  04/20/16 0824 04/25/16 1110  INR 1.32 1.08    BMP:  Recent Labs  04/20/16 0824 04/22/16 1148 04/23/16 0811 04/25/16 0634  NA 137 136 140 136  K 3.8 3.4* 3.9 3.2*  CL 103 104 106 100*  CO2 23 20* 25 24  GLUCOSE 113* 92 118* 111*  BUN 23* 28* 14 11  CALCIUM 9.0 8.9 9.5 9.3  CREATININE 1.17* 0.90 0.82 0.96  GFRNONAA 39* 54* >60 50*  GFRAA 46* >60 >60 58*    LIVER FUNCTION TESTS:  Recent Labs  04/19/16 1235 04/20/16 0824  BILITOT 1.0 0.9  AST 18 18  ALT 14 12*  ALKPHOS 74 62  PROT 6.9 5.8*  ALBUMIN 4.0 3.0*    Assessment and Plan: S/p LLQ diverticular abscess drain Monitor output, current appearance concerning for fistula, hopefully will transition to serous. Would definitely recommend drain injection prior to consideration of drain removal.  Electronically Signed: Brayton El 04/26/2016, 9:52 AM   I spent a total of 15 Minutes at the the patient's bedside AND on the patient's hospital floor or unit, greater than 50% of which was counseling/coordinating care for LLQ abscess drain

## 2016-04-26 NOTE — Progress Notes (Signed)
PROGRESS NOTE                                                                                                                                                                                                             Patient Demographics:    [facility], is a 83 y.o. female, DOB - 1922/11/30, NWG:[id number]  Admit date - 04/19/2016   Admitting Physician Haydee Salter, MD  Outpatient Primary MD for the patient is Florentina Jenny, MD  LOS - 7  Outpatient Specialists: none  Chief Complaint  Patient presents with  . Abdominal Pain       Brief Narrative   52 resident of [facility] assisted [facility] moderate dementia,  hypertension,  hypothyroidism  history of TIA r presented to the ED with abdominal pain.  History limited due to dementia. Vitals were stable.  CT scan of the abdomen done showed thick-walled distal descending colon with perforation.  Admitted to hospitalist service and surgery consulted.   Subjective:   Fair, mild confused,  Has some abdominal pain Has been tolerating some diet   Assessment  & Plan :   Acute diverticulitis/colitis -CT scan 1/5 repeat shows 5x3x7 abscess. -empiric IV Zosyn, IVF, bowel rest -CCS following -had IR drainage of abscess 1/5 -clear liq diet -morphine 2mg  q 4 prn pain -Looking for placement as may not be able to back to DC back to current ALF [facility] as they may not be able to manage during  Essential hypertension -stable, on lisinopril At home which has been held for now, using hydralazine  History of TIA -holding for now plavix -Resumed on 04/26/2016  Dementia, moderate with acute delirium -Resides at ALF, Per son, pt recognizes some of the family members, capable of most of her ADLs. -may need short term SNF, Pt/OT evals pending -haldol 1 mg every 6 PRN for agitation and low dose seroquel 12.5 QHS, continue Aricept 10 daily at bedtime -son reports increased  agitation with ativan  Hypokalemia -Replacing with 40 mEq's daily K dur  Acute kidney injury -due to decreased PO, resolved with IVF -Stable currently  Hypothyroid Cont synthroid 50 mcg  DVT proph: lovenox  Code Status : DO NOT RESUSCITATE Family Communication  : Discussed with the patient's son on the telephone 04/26/2016 Disposition Plan  : Pending hospital course and clinical improvement SNF/ ALF rehab  [facility] d/c in 24-48 hrs once drain output decreases  Consults  :   CCS  Procedures  :  CT abdomen and pelvis   Lab Results  Component Value Date   PLT 277 04/26/2016    Antibiotics  :      Objective:   Vitals:   04/25/16 1637 04/25/16 1707 04/25/16 2214 04/26/16 0537  BP: (!) 155/63 (!) 158/78 (!) 152/80 (!) 170/72  Pulse: (!) 59 73 80 72  Resp:   16 18  Temp:   98.3 F (36.8 C) 98.3 F (36.8 C)  TempSrc:   Oral Oral  SpO2: 93% 96% 98% 92%  Weight:      Height:        Wt Readings from Last 3 Encounters:  04/20/16 53.7 kg (118 lb 4.8 oz)  01/28/16 53.9 kg (118 lb 13.3 oz)  11/08/15 54 kg (119 lb)     Intake/Output Summary (Last 24 hours) at 04/26/16 1359 Last data filed at 04/26/16 1346  Gross per 24 hour  Intake              460 ml  Output               60 ml  Net              400 ml     Physical Exam  Gen: Alert, oriented to self only, pleasant, confused abt other questions HEENT:moist mucosa, supple neck Chest: CTAB CVS: N S1&S2, no murmurs,  GI: soft, non tender now, Nondistended, bowel sounds increased Musculoskeletal: warm, no edema     Data Review:    CBC  Recent Labs Lab 04/20/16 0824 04/21/16 0420 04/23/16 0835 04/25/16 0634 04/26/16 0822  WBC 10.7* 9.9 7.7 11.6* 10.3  HGB 10.9* 10.0* 11.4* 12.3 11.0*  HCT 34.1* 31.3* 35.0* 38.0 34.4*  PLT 222 210 270 306 277  MCV 82.2 84.1 81.2 80.2 81.7  MCH 26.3 26.9 26.5 25.9* 26.1  MCHC 32.0 31.9 32.6 32.4 32.0  RDW 13.9 14.1 13.2 13.2 13.6    Chemistries   Recent  Labs Lab 04/20/16 0824 04/22/16 1148 04/23/16 0811 04/25/16 0634 04/26/16 0822  NA 137 136 140 136 138  K 3.8 3.4* 3.9 3.2* 3.5  CL 103 104 106 100* 104  CO2 23 20* 25 24 25   GLUCOSE 113* 92 118* 111* 87  BUN 23* 28* 14 11 11   CREATININE 1.17* 0.90 0.82 0.96 0.87  CALCIUM 9.0 8.9 9.5 9.3 9.0  MG 1.8  --   --   --   --   AST 18  --   --   --   --   ALT 12*  --   --   --   --   ALKPHOS 62  --   --   --   --   BILITOT 0.9  --   --   --   --    ------------------------------------------------------------------------------------------------------------------ No results for input(s): CHOL, HDL, LDLCALC, TRIG, CHOLHDL, LDLDIRECT in the last 72 hours.  No results found for: HGBA1C ------------------------------------------------------------------------------------------------------------------ No results for input(s): TSH, T4TOTAL, T3FREE, THYROIDAB in the last 72 hours.  Invalid input(s): FREET3 ------------------------------------------------------------------------------------------------------------------ No results for input(s): VITAMINB12, FOLATE, FERRITIN, TIBC, IRON, RETICCTPCT in the last 72 hours.  Coagulation profile  Recent Labs Lab 04/20/16 0824 04/25/16 1110  INR 1.32 1.08    No results for input(s): DDIMER in the last 72 hours.  Cardiac Enzymes No results for input(s): CKMB, TROPONINI, MYOGLOBIN in the  last 168 hours.  Invalid input(s): CK ------------------------------------------------------------------------------------------------------------------ No results found for: BNP  Inpatient Medications  Scheduled Meds: . amLODipine  5 mg Oral Daily  . donepezil  10 mg Oral QHS  . heparin  5,000 Units Subcutaneous Q8H  . levothyroxine  50 mcg Oral Once per day on Mon Wed Fri  . ondansetron (ZOFRAN) IV  4 mg Intravenous Q6H  . piperacillin-tazobactam (ZOSYN)  IV  3.375 g Intravenous Q8H  . potassium chloride  40 mEq Oral Daily  . QUEtiapine  12.5 mg Oral  QHS   Continuous Infusions:  PRN Meds:.acetaminophen **OR** acetaminophen, fentaNYL, haloperidol lactate, hydrALAZINE, midazolam, morphine injection  Micro Results Recent Results (from the past 240 hour(s))  MRSA PCR Screening     Status: None   Collection Time: 04/20/16 12:24 AM  Result Value Ref Range Status   MRSA by PCR NEGATIVE NEGATIVE Final    Comment:        The GeneXpert MRSA Assay (FDA approved for NASAL specimens only), is one component of a comprehensive MRSA colonization surveillance program. It is not intended to diagnose MRSA infection nor to guide or monitor treatment for MRSA infections.   Culture, Urine     Status: None   Collection Time: 04/21/16  4:22 PM  Result Value Ref Range Status   Specimen Description URINE, CATHETERIZED  Final   Special Requests NONE  Final   Culture NO GROWTH  Final   Report Status 04/22/2016 FINAL  Final  Aerobic/Anaerobic Culture (surgical/deep wound)     Status: None (Preliminary result)   Collection Time: 04/25/16  4:27 PM  Result Value Ref Range Status   Specimen Description ABSCESS  Final   Special Requests LLQ diverticular abscess drain  Final   Gram Stain   Final    NO WBC SEEN FEW GRAM NEGATIVE RODS RARE GRAM POSITIVE RODS RARE GRAM POSITIVE COCCI IN PAIRS    Culture   Final    FEW GRAM NEGATIVE RODS MODERATE CANDIDA ALBICANS    Report Status PENDING  Incomplete    Radiology Reports Ct Abdomen Pelvis W Contrast  Result Date: 04/25/2016 CLINICAL DATA:  Left lower quadrant pain for awhile. EXAM: CT ABDOMEN AND PELVIS WITH CONTRAST TECHNIQUE: Multidetector CT imaging of the abdomen and pelvis was performed using the standard protocol following bolus administration of intravenous contrast. CONTRAST:  75mL ISOVUE-300 IOPAMIDOL (ISOVUE-300) INJECTION 61% COMPARISON:  04/19/2016 FINDINGS: Lower chest:  Unremarkable. Hepatobiliary: No focal abnormality within the liver parenchyma. Gallbladder is distended with several  gallstones identified measuring about 5 mm each. No evidence gallbladder wall thickening or pericholecystic fluid. Extrahepatic common bile duct remains mildly distended at 10 mm. No intrahepatic biliary duct dilatation. Pancreas: Pancreas is diffusely atrophic. Spleen: No splenomegaly. No focal mass lesion. Adrenals/Urinary Tract: No adrenal nodule or mass. Kidneys are unremarkable. No evidence for hydroureter. The urinary bladder appears normal for the degree of distention. Stomach/Bowel: Moderate to large hiatal hernia is evident. Duodenal diverticulum noted in the region of the ampulla. No small bowel wall thickening. No small bowel dilatation. The terminal ileum is normal. The appendix is normal. Diverticular changes are seen scattered along the length of the colon. In the anterior left lower quadrant, in the region of the distal descending/ proximal sigmoid colon, a 4.7 x 3.0 x 6.5 cm collection of gas and fluid is identified just deep to the rectus musculature. This is probably in the preperitoneal space for contained within the inferior omentum. Adjacent tiny contained loculated sub of free air  are associated and there is some gas in the subcutaneous tissues of the overlying left lower quadrant anterior abdominal wall, potentially representing dissection through the rectus sheath or potentially reflecting overlying injection sites. Vascular/Lymphatic: There is abdominal aortic atherosclerosis without aneurysm. There is no gastrohepatic or hepatoduodenal ligament lymphadenopathy. No intraperitoneal or retroperitoneal lymphadenopathy. No pelvic sidewall lymphadenopathy. Reproductive: The uterus has normal CT imaging appearance. There is no adnexal mass. Other: No intraperitoneal free fluid. Musculoskeletal: Bone windows reveal no worrisome lytic or sclerotic osseous lesions. Degenerative changes noted lower lumbar spine. IMPRESSION: 1. The wall thickening seen previously in the distal descending colon near the  junction with the sigmoid segment has decreased in the interval although pericolonic edema/inflammation does persist and there is now a 5 x 3 x 7 cm abscess just anterior to this segment of colon, localized either in the preperitoneal fat or lower omentum. Changes most likely represent perforated diverticulitis with interval development of para colonic abscess. Tiny gas bubbles are seen in the overlying subcutaneous fat, superficial to the rectus sheath, probably related to subcutaneous injection site although dissection of gas through the rectus sheath into the subcutaneous fat cannot be entirely excluded. 2.  Abdominal Aortic Atherosclerois (ICD10-170.0) 3. Distended gallbladder with tiny calcified gallstones and slight progression of extrahepatic biliary duct dilatation. Correlation with liver function test may prove helpful. 4. Moderate hiatal hernia. Electronically Signed   By: Kennith Center M.D.   On: 04/25/2016 08:23   Ct Abdomen Pelvis W Contrast  Result Date: 04/19/2016 CLINICAL DATA:  Bilateral lower quadrant tenderness for 1 day EXAM: CT ABDOMEN AND PELVIS WITH CONTRAST TECHNIQUE: Multidetector CT imaging of the abdomen and pelvis was performed using the standard protocol following bolus administration of intravenous contrast. CONTRAST:  ISOVUE-300 IOPAMIDOL (ISOVUE-300) INJECTION 61% COMPARISON:  May 27, 2009 FINDINGS: Lower chest: No acute abnormality. Hepatobiliary: There is diffuse low density of liver without vessel displacement. No focal liver lesion is identified. There are gallstones within the gallbladder. The common bowel duct measures 7 mm at the pancreatic head upper limits are normal for patient age. Pancreas: Unremarkable. No pancreatic ductal dilatation or surrounding inflammatory changes. Spleen: Normal in size without focal abnormality. Adrenals/Urinary Tract: Adrenal glands are unremarkable. Kidneys are normal, without renal calculi, focal lesion, or hydronephrosis.  Bladder is unremarkable. Stomach/Bowel: There are abnormal thick wall small bowel loops in the pelvis. There is 3 cm abnormal circumferential the wall of the distal descending colon. There is a 4 mm focus air which may be extraluminal adjacent to the abnormal thick wall distal descending colon. There is stranding inflammation surrounding the descending colon more prominently in the distal descending colon. There is a large hiatal hernia. Vascular/Lymphatic: Aortic atherosclerosis. No enlarged abdominal or pelvic lymph nodes. Reproductive: Uterus and bilateral adnexa are unremarkable. Other: Small amount free fluid is identified in the pelvis. Musculoskeletal: Degenerative joint changes of the spine are noted. IMPRESSION: 3 cm length of abnormal circumferential thick walled distal descending colon with adjacent focus air which may be extraluminal. Findings could be due to for colonic neoplasm with focal perforation. There are abnormal thick wall small bowel loops in the pelvis which may be inflammatory. These results will be called to the ordering clinician or representative by the Radiologist Assistant, and communication documented in the PACS or zVision Dashboard. Electronically Signed   By: Sherian Rein M.D.   On: 04/19/2016 14:13   Ct Image Guided Drainage By Percutaneous Catheter  Result Date: 04/25/2016 INDICATION: LEFT LOWER QUADRANT DIVERTICULAR ABSCESS EXAM:  CT GUIDED DRAINAGE OF DIVERTICULAR ABSCESS MEDICATIONS: The patient is currently admitted to the hospital and receiving intravenous antibiotics. The antibiotics were administered within an appropriate time frame prior to the initiation of the procedure. ANESTHESIA/SEDATION: 1.0 mg IV Versed 75 mcg IV Fentanyl Moderate Sedation Time:  13 MINUTES The patient was continuously monitored during the procedure by the interventional radiology nurse under my direct supervision. COMPLICATIONS: None immediate. TECHNIQUE: Informed written consent was obtained  from the patient after a thorough discussion of the procedural risks, benefits and alternatives. All questions were addressed. Maximal Sterile Barrier Technique was utilized including caps, mask, sterile gowns, sterile gloves, sterile drape, hand hygiene and skin antiseptic. A timeout was performed prior to the initiation of the procedure. PROCEDURE: previous imaging reviewed. patient positioned supine. noncontrast localization ct performed. the left lower quadrant anterior abdominal abscess was localized with ct. overlying skin marked. under sterile conditions and local anesthesia, an 18 gauge 10 cm introducer needle was advanced from an anterior oblique approach into the left lower quadrant abdominal abscess. needle position confirmed with ct. syringe aspiration yielded exudative fecal contaminated fluid. sample sent for gram stain and culture. guidewire inserted followed by tract dilatation to insert a 10 french drain. drain catheter position confirmed with ct. catheter secured with a prolene suture and a sterile dressing. drain catheter connected to external suction bulb. patient tolerated the procedure well. No immediate complication. FINDINGS: CT imaging confirms needle placement in the left lower quadrant anterior diverticular abscess for drain placement IMPRESSION: Successful CT-guided left lower quadrant diverticular abscess drain insertion. Electronically Signed   By: Judie Petit.  Shick M.D.   On: 04/25/2016 15:41    Time Spent in minutes 15   Rhetta Mura M.D on 04/26/2016 at 4:22 PM Pleas Koch, MD [facility] [phone]   After 7pm go to [email or web address] - password [facility]  [facility] -  Office  [phone]

## 2016-04-26 NOTE — Progress Notes (Signed)
Subjective: Patient resting comfortably Pleasantly confused Mild LLQ tenderness at drain site  Objective: Vital signs in last 24 hours: Temp:  [98.3 F (36.8 C)] 98.3 F (36.8 C) (01/06 0537) Pulse Rate:  [59-80] 72 (01/06 0537) Resp:  [12-18] 18 (01/06 0537) BP: (132-170)/(60-92) 170/72 (01/06 0537) SpO2:  [92 %-100 %] 92 % (01/06 0537) Last BM Date: 04/24/16  Intake/Output from previous day: 01/05 0701 - 01/06 0700 In: 405 [P.O.:240; IV Piggyback:100] Out: 645 [Urine:600; Drains:45] Intake/Output this shift: Total I/O In: 50 [IV Piggyback:50] Out: -   General appearance: alert, cooperative and no distress GI: soft, minimal LLQ tenderness around site of percutaneous drain  Lab Results:   Recent Labs  04/25/16 0634 04/26/16 0822  WBC 11.6* 10.3  HGB 12.3 11.0*  HCT 38.0 34.4*  PLT 306 277   BMET  Recent Labs  04/25/16 0634  NA 136  K 3.2*  CL 100*  CO2 24  GLUCOSE 111*  BUN 11  CREATININE 0.96  CALCIUM 9.3   PT/INR  Recent Labs  04/25/16 1110  LABPROT 14.1  INR 1.08   ABG No results for input(s): PHART, HCO3 in the last 72 hours.  Invalid input(s): PCO2, PO2  Studies/Results: Ct Abdomen Pelvis W Contrast  Result Date: 04/25/2016 CLINICAL DATA:  Left lower quadrant pain for awhile. EXAM: CT ABDOMEN AND PELVIS WITH CONTRAST TECHNIQUE: Multidetector CT imaging of the abdomen and pelvis was performed using the standard protocol following bolus administration of intravenous contrast. CONTRAST:  75mL ISOVUE-300 IOPAMIDOL (ISOVUE-300) INJECTION 61% COMPARISON:  04/19/2016 FINDINGS: Lower chest:  Unremarkable. Hepatobiliary: No focal abnormality within the liver parenchyma. Gallbladder is distended with several gallstones identified measuring about 5 mm each. No evidence gallbladder wall thickening or pericholecystic fluid. Extrahepatic common bile duct remains mildly distended at 10 mm. No intrahepatic biliary duct dilatation. Pancreas: Pancreas is  diffusely atrophic. Spleen: No splenomegaly. No focal mass lesion. Adrenals/Urinary Tract: No adrenal nodule or mass. Kidneys are unremarkable. No evidence for hydroureter. The urinary bladder appears normal for the degree of distention. Stomach/Bowel: Moderate to large hiatal hernia is evident. Duodenal diverticulum noted in the region of the ampulla. No small bowel wall thickening. No small bowel dilatation. The terminal ileum is normal. The appendix is normal. Diverticular changes are seen scattered along the length of the colon. In the anterior left lower quadrant, in the region of the distal descending/ proximal sigmoid colon, a 4.7 x 3.0 x 6.5 cm collection of gas and fluid is identified just deep to the rectus musculature. This is probably in the preperitoneal space for contained within the inferior omentum. Adjacent tiny contained loculated sub of free air are associated and there is some gas in the subcutaneous tissues of the overlying left lower quadrant anterior abdominal wall, potentially representing dissection through the rectus sheath or potentially reflecting overlying injection sites. Vascular/Lymphatic: There is abdominal aortic atherosclerosis without aneurysm. There is no gastrohepatic or hepatoduodenal ligament lymphadenopathy. No intraperitoneal or retroperitoneal lymphadenopathy. No pelvic sidewall lymphadenopathy. Reproductive: The uterus has normal CT imaging appearance. There is no adnexal mass. Other: No intraperitoneal free fluid. Musculoskeletal: Bone windows reveal no worrisome lytic or sclerotic osseous lesions. Degenerative changes noted lower lumbar spine. IMPRESSION: 1. The wall thickening seen previously in the distal descending colon near the junction with the sigmoid segment has decreased in the interval although pericolonic edema/inflammation does persist and there is now a 5 x 3 x 7 cm abscess just anterior to this segment of colon, localized either in the preperitoneal  fat or  lower omentum. Changes most likely represent perforated diverticulitis with interval development of para colonic abscess. Tiny gas bubbles are seen in the overlying subcutaneous fat, superficial to the rectus sheath, probably related to subcutaneous injection site although dissection of gas through the rectus sheath into the subcutaneous fat cannot be entirely excluded. 2.  Abdominal Aortic Atherosclerois (ICD10-170.0) 3. Distended gallbladder with tiny calcified gallstones and slight progression of extrahepatic biliary duct dilatation. Correlation with liver function test may prove helpful. 4. Moderate hiatal hernia. Electronically Signed   By: Kennith Center M.D.   On: 04/25/2016 08:23   Ct Image Guided Drainage By Percutaneous Catheter  Result Date: 04/25/2016 INDICATION: LEFT LOWER QUADRANT DIVERTICULAR ABSCESS EXAM: CT GUIDED DRAINAGE OF DIVERTICULAR ABSCESS MEDICATIONS: The patient is currently admitted to the hospital and receiving intravenous antibiotics. The antibiotics were administered within an appropriate time frame prior to the initiation of the procedure. ANESTHESIA/SEDATION: 1.0 mg IV Versed 75 mcg IV Fentanyl Moderate Sedation Time:  13 MINUTES The patient was continuously monitored during the procedure by the interventional radiology nurse under my direct supervision. COMPLICATIONS: None immediate. TECHNIQUE: Informed written consent was obtained from the patient after a thorough discussion of the procedural risks, benefits and alternatives. All questions were addressed. Maximal Sterile Barrier Technique was utilized including caps, mask, sterile gowns, sterile gloves, sterile drape, hand hygiene and skin antiseptic. A timeout was performed prior to the initiation of the procedure. PROCEDURE: previous imaging reviewed. patient positioned supine. noncontrast localization ct performed. the left lower quadrant anterior abdominal abscess was localized with ct. overlying skin marked. under sterile  conditions and local anesthesia, an 18 gauge 10 cm introducer needle was advanced from an anterior oblique approach into the left lower quadrant abdominal abscess. needle position confirmed with ct. syringe aspiration yielded exudative fecal contaminated fluid. sample sent for gram stain and culture. guidewire inserted followed by tract dilatation to insert a 10 french drain. drain catheter position confirmed with ct. catheter secured with a prolene suture and a sterile dressing. drain catheter connected to external suction bulb. patient tolerated the procedure well. No immediate complication. FINDINGS: CT imaging confirms needle placement in the left lower quadrant anterior diverticular abscess for drain placement IMPRESSION: Successful CT-guided left lower quadrant diverticular abscess drain insertion. Electronically Signed   By: Judie Petit.  Shick M.D.   On: 04/25/2016 15:41    Anti-infectives: Anti-infectives    Start     Dose/Rate Route Frequency Ordered Stop   04/20/16 0830  piperacillin-tazobactam (ZOSYN) IVPB 3.375 g     3.375 g 12.5 mL/hr over 240 Minutes Intravenous Every 8 hours 04/20/16 0825     04/20/16 0230  ciprofloxacin (CIPRO) IVPB 400 mg  Status:  Discontinued     400 mg 200 mL/hr over 60 Minutes Intravenous Every 12 hours 04/19/16 1938 04/20/16 0812   04/20/16 0000  metroNIDAZOLE (FLAGYL) IVPB 500 mg  Status:  Discontinued     500 mg 100 mL/hr over 60 Minutes Intravenous Every 8 hours 04/19/16 1938 04/20/16 0812   04/19/16 1430  metroNIDAZOLE (FLAGYL) IVPB 500 mg     500 mg 100 mL/hr over 60 Minutes Intravenous  Once 04/19/16 1428 04/19/16 1543   04/19/16 1430  ciprofloxacin (CIPRO) tablet 500 mg     500 mg Oral  Once 04/19/16 1428 04/19/16 1434      Assessment/Plan: Acute colitis/diverticulitis with microperforation-no evidence of sepsis - WBC down slightly today after drain placement, afebrile -CT guided drain placed yesterday  Dementia Hypothyroidism Essential  hypertension  Urinary tract infection  ID:  Cipro 12/30>>12/31  Flagyl 12/30>>12/31 Zosyn 12/31>> FEN: NPO, IVF VTE: SCDs, Heparin and Plavix (held today for procedure)  Plan:IR drain - improving Advance diet as tolerated  LOS: 7 days    Deckard Stuber K. 04/26/2016

## 2016-04-26 NOTE — Progress Notes (Signed)
Dressing to patients L L abdomen is clean dry and intact. I did not replace dressing.

## 2016-04-26 NOTE — Progress Notes (Signed)
Patient really complaining of L sided pain. Tylenol given , 650, and patient reported effectiveness.

## 2016-04-26 NOTE — Progress Notes (Signed)
Patient complaining of nausea following lunch. MD made aware and stated he will come assess. MD verbal order to place on clear liquids.

## 2016-04-27 LAB — CBC WITH DIFFERENTIAL/PLATELET
BASOS ABS: 0 10*3/uL (ref 0.0–0.1)
BASOS PCT: 0 %
Eosinophils Absolute: 0.1 10*3/uL (ref 0.0–0.7)
Eosinophils Relative: 1 %
HEMATOCRIT: 33.8 % — AB (ref 36.0–46.0)
HEMOGLOBIN: 10.8 g/dL — AB (ref 12.0–15.0)
Lymphocytes Relative: 16 %
Lymphs Abs: 1.6 10*3/uL (ref 0.7–4.0)
MCH: 26.3 pg (ref 26.0–34.0)
MCHC: 32 g/dL (ref 30.0–36.0)
MCV: 82.4 fL (ref 78.0–100.0)
Monocytes Absolute: 0.5 10*3/uL (ref 0.1–1.0)
Monocytes Relative: 5 %
NEUTROS ABS: 7.8 10*3/uL — AB (ref 1.7–7.7)
NEUTROS PCT: 78 %
Platelets: 255 10*3/uL (ref 150–400)
RBC: 4.1 MIL/uL (ref 3.87–5.11)
RDW: 14 % (ref 11.5–15.5)
WBC: 10.1 10*3/uL (ref 4.0–10.5)

## 2016-04-27 NOTE — Progress Notes (Signed)
PROGRESS NOTE                                                                                                                                                                                                             Patient Demographics:    Tara Hicks, is a 83 y.o. female, DOB - 02-12-1923, ZOX:[id number]  Admit date - 04/19/2016   Admitting Physician Haydee Salter, MD  Outpatient Primary MD for the patient is Florentina Jenny, MD  LOS - 8  Outpatient Specialists: none  Chief Complaint  Patient presents with  . Abdominal Pain       Brief Narrative   54 resident of [facility] assisted [facility] moderate dementia,  hypertension,  hypothyroidism  history of TIA r presented to the ED with abdominal pain.  History limited due to dementia. Vitals were stable.  CT scan of the abdomen done showed thick-walled distal descending colon with perforation.  Admitted to hospitalist service and surgery consulted. Had IR placed drain 04/25/2016 for abscess which was found on the CT scan   Subjective:   confused,  Has some abdominal pain Unclear if she has been eating   Assessment  & Plan :   Acute diverticulitis/colitis -CT scan 1/5 repeat shows 5x3x7 abscess. -empiric IV Zosyn, IVF, bowel rest -had IR drainage of abscess 1/5 -CCS following -Stable in 10 range -morphine 2mg  q 4 prn pain -We will need to be in hospital until CT scan later this week and drainage injection prior to discharge  Essential hypertension -stable, on lisinopril At home which has been held for now, using hydralazine  History of TIA -holding for now plavix -Resumed on 04/26/2016  Dementia, moderate with acute delirium -Resides at ALF, Per son, pt recognizes some of the family members, capable of most of her ADLs. -can return to [facility] SNF -haldol 1 mg every 6 PRN for agitation and low dose seroquel 12.5 QHS, continue Aricept 10 daily at  bedtime -son reports increased agitation with ativan  Hypokalemia -Replacing with 40 mEq's daily K dur  Acute kidney injury -due to decreased PO, resolved with IVF -Stable currently  Hypothyroid Cont synthroid 50 mcg  DVT proph: lovenox  Code Status : DO NOT RESUSCITATE Family Communication  : Discussed with the patient's son in person 04/27/2006 HEENT Disposition Plan  : Pending hospital course and clinical  improvement SNF/ ALF rehab [facility] later this week once cleared by surgery  Consults  :   CCS  Procedures  :  CT abdomen and pelvis   Lab Results  Component Value Date   PLT 255 04/27/2016    Antibiotics  :      Objective:   Vitals:   04/25/16 2214 04/26/16 0537 04/26/16 2116 04/27/16 0550  BP: (!) 152/80 (!) 170/72 (!) 150/57 (!) 153/67  Pulse: 80 72 61 63  Resp: 16 18  18   Temp: 98.3 F (36.8 C) 98.3 F (36.8 C) 98 F (36.7 C) 98.2 F (36.8 C)  TempSrc: Oral Oral Oral Oral  SpO2: 98% 92% 96% 95%  Weight:      Height:        Wt Readings from Last 3 Encounters:  04/20/16 53.7 kg (118 lb 4.8 oz)  01/28/16 53.9 kg (118 lb 13.3 oz)  11/08/15 54 kg (119 lb)     Intake/Output Summary (Last 24 hours) at 04/27/16 1326 Last data filed at 04/27/16 1610  Gross per 24 hour  Intake              165 ml  Output              210 ml  Net              -45 ml     Physical Exam  Gen: Alert, oriented to self only, pleasant HEENT:moist mucosa, supple neck Chest: CTAB CVS: N S1&S2, no murmurs,  GI: soft, non tender now, Nondistended, bowel sounds increased--drain in left lower quadrant Musculoskeletal: warm, no edema     Data Review:    CBC  Recent Labs Lab 04/21/16 0420 04/23/16 0835 04/25/16 0634 04/26/16 0822 04/27/16 0913  WBC 9.9 7.7 11.6* 10.3 10.1  HGB 10.0* 11.4* 12.3 11.0* 10.8*  HCT 31.3* 35.0* 38.0 34.4* 33.8*  PLT 210 270 306 277 255  MCV 84.1 81.2 80.2 81.7 82.4  MCH 26.9 26.5 25.9* 26.1 26.3  MCHC 31.9 32.6 32.4 32.0 32.0   RDW 14.1 13.2 13.2 13.6 14.0  LYMPHSABS  --   --   --   --  1.6  MONOABS  --   --   --   --  0.5  EOSABS  --   --   --   --  0.1  BASOSABS  --   --   --   --  0.0    Chemistries   Recent Labs Lab 04/22/16 1148 04/23/16 0811 04/25/16 0634 04/26/16 0822  NA 136 140 136 138  K 3.4* 3.9 3.2* 3.5  CL 104 106 100* 104  CO2 20* 25 24 25   GLUCOSE 92 118* 111* 87  BUN 28* 14 11 11   CREATININE 0.90 0.82 0.96 0.87  CALCIUM 8.9 9.5 9.3 9.0   ------------------------------------------------------------------------------------------------------------------ No results for input(s): CHOL, HDL, LDLCALC, TRIG, CHOLHDL, LDLDIRECT in the last 72 hours.  No results found for: HGBA1C ------------------------------------------------------------------------------------------------------------------ No results for input(s): TSH, T4TOTAL, T3FREE, THYROIDAB in the last 72 hours.  Invalid input(s): FREET3 ------------------------------------------------------------------------------------------------------------------ No results for input(s): VITAMINB12, FOLATE, FERRITIN, TIBC, IRON, RETICCTPCT in the last 72 hours.  Coagulation profile  Recent Labs Lab 04/25/16 1110  INR 1.08    No results for input(s): DDIMER in the last 72 hours.  Cardiac Enzymes No results for input(s): CKMB, TROPONINI, MYOGLOBIN in the last 168 hours.  Invalid input(s): CK ------------------------------------------------------------------------------------------------------------------ No results found for: BNP  Inpatient Medications  Scheduled Meds: . amLODipine  5 mg Oral Daily  . clopidogrel  75 mg Oral Daily  . donepezil  10 mg Oral QHS  . heparin  5,000 Units Subcutaneous Q8H  . levothyroxine  50 mcg Oral Once per day on Mon Wed Fri  . ondansetron (ZOFRAN) IV  4 mg Intravenous Q6H  . piperacillin-tazobactam (ZOSYN)  IV  3.375 g Intravenous Q8H  . potassium chloride  40 mEq Oral Daily  . QUEtiapine  12.5  mg Oral QHS   Continuous Infusions:  PRN Meds:.acetaminophen **OR** acetaminophen, fentaNYL, haloperidol lactate, hydrALAZINE, midazolam, morphine injection  Micro Results Recent Results (from the past 240 hour(s))  MRSA PCR Screening     Status: None   Collection Time: 04/20/16 12:24 AM  Result Value Ref Range Status   MRSA by PCR NEGATIVE NEGATIVE Final    Comment:        The GeneXpert MRSA Assay (FDA approved for NASAL specimens only), is one component of a comprehensive MRSA colonization surveillance program. It is not intended to diagnose MRSA infection nor to guide or monitor treatment for MRSA infections.   Culture, Urine     Status: None   Collection Time: 04/21/16  4:22 PM  Result Value Ref Range Status   Specimen Description URINE, CATHETERIZED  Final   Special Requests NONE  Final   Culture NO GROWTH  Final   Report Status 04/22/2016 FINAL  Final  Aerobic/Anaerobic Culture (surgical/deep wound)     Status: None (Preliminary result)   Collection Time: 04/25/16  4:27 PM  Result Value Ref Range Status   Specimen Description ABSCESS  Final   Special Requests LLQ diverticular abscess drain  Final   Gram Stain   Final    NO WBC SEEN FEW GRAM NEGATIVE RODS RARE GRAM POSITIVE RODS RARE GRAM POSITIVE COCCI IN PAIRS    Culture FEW ESCHERICHIA COLI MODERATE CANDIDA ALBICANS   Final   Report Status PENDING  Incomplete   Organism ID, Bacteria ESCHERICHIA COLI  Final      Susceptibility   Escherichia coli - MIC*    AMPICILLIN >=32 RESISTANT Resistant     CEFAZOLIN <=4 SENSITIVE Sensitive     CEFEPIME <=1 SENSITIVE Sensitive     CEFTAZIDIME <=1 SENSITIVE Sensitive     CEFTRIAXONE <=1 SENSITIVE Sensitive     CIPROFLOXACIN >=4 RESISTANT Resistant     GENTAMICIN <=1 SENSITIVE Sensitive     IMIPENEM <=0.25 SENSITIVE Sensitive     TRIMETH/SULFA >=320 RESISTANT Resistant     AMPICILLIN/SULBACTAM 16 INTERMEDIATE Intermediate     PIP/TAZO <=4 SENSITIVE Sensitive      Extended ESBL NEGATIVE Sensitive     * FEW ESCHERICHIA COLI    Radiology Reports Ct Abdomen Pelvis W Contrast  Result Date: 04/25/2016 CLINICAL DATA:  Left lower quadrant pain for awhile. EXAM: CT ABDOMEN AND PELVIS WITH CONTRAST TECHNIQUE: Multidetector CT imaging of the abdomen and pelvis was performed using the standard protocol following bolus administration of intravenous contrast. CONTRAST:  75mL ISOVUE-300 IOPAMIDOL (ISOVUE-300) INJECTION 61% COMPARISON:  04/19/2016 FINDINGS: Lower chest:  Unremarkable. Hepatobiliary: No focal abnormality within the liver parenchyma. Gallbladder is distended with several gallstones identified measuring about 5 mm each. No evidence gallbladder wall thickening or pericholecystic fluid. Extrahepatic common bile duct remains mildly distended at 10 mm. No intrahepatic biliary duct dilatation. Pancreas: Pancreas is diffusely atrophic. Spleen: No splenomegaly. No focal mass lesion. Adrenals/Urinary Tract: No adrenal nodule or mass. Kidneys are unremarkable. No evidence for hydroureter. The urinary bladder appears normal for the degree  of distention. Stomach/Bowel: Moderate to large hiatal hernia is evident. Duodenal diverticulum noted in the region of the ampulla. No small bowel wall thickening. No small bowel dilatation. The terminal ileum is normal. The appendix is normal. Diverticular changes are seen scattered along the length of the colon. In the anterior left lower quadrant, in the region of the distal descending/ proximal sigmoid colon, a 4.7 x 3.0 x 6.5 cm collection of gas and fluid is identified just deep to the rectus musculature. This is probably in the preperitoneal space for contained within the inferior omentum. Adjacent tiny contained loculated sub of free air are associated and there is some gas in the subcutaneous tissues of the overlying left lower quadrant anterior abdominal wall, potentially representing dissection through the rectus sheath or potentially  reflecting overlying injection sites. Vascular/Lymphatic: There is abdominal aortic atherosclerosis without aneurysm. There is no gastrohepatic or hepatoduodenal ligament lymphadenopathy. No intraperitoneal or retroperitoneal lymphadenopathy. No pelvic sidewall lymphadenopathy. Reproductive: The uterus has normal CT imaging appearance. There is no adnexal mass. Other: No intraperitoneal free fluid. Musculoskeletal: Bone windows reveal no worrisome lytic or sclerotic osseous lesions. Degenerative changes noted lower lumbar spine. IMPRESSION: 1. The wall thickening seen previously in the distal descending colon near the junction with the sigmoid segment has decreased in the interval although pericolonic edema/inflammation does persist and there is now a 5 x 3 x 7 cm abscess just anterior to this segment of colon, localized either in the preperitoneal fat or lower omentum. Changes most likely represent perforated diverticulitis with interval development of para colonic abscess. Tiny gas bubbles are seen in the overlying subcutaneous fat, superficial to the rectus sheath, probably related to subcutaneous injection site although dissection of gas through the rectus sheath into the subcutaneous fat cannot be entirely excluded. 2.  Abdominal Aortic Atherosclerois (ICD10-170.0) 3. Distended gallbladder with tiny calcified gallstones and slight progression of extrahepatic biliary duct dilatation. Correlation with liver function test may prove helpful. 4. Moderate hiatal hernia. Electronically Signed   By: Kennith Center M.D.   On: 04/25/2016 08:23   Ct Abdomen Pelvis W Contrast  Result Date: 04/19/2016 CLINICAL DATA:  Bilateral lower quadrant tenderness for 1 day EXAM: CT ABDOMEN AND PELVIS WITH CONTRAST TECHNIQUE: Multidetector CT imaging of the abdomen and pelvis was performed using the standard protocol following bolus administration of intravenous contrast. CONTRAST:  ISOVUE-300 IOPAMIDOL (ISOVUE-300) INJECTION  61% COMPARISON:  May 27, 2009 FINDINGS: Lower chest: No acute abnormality. Hepatobiliary: There is diffuse low density of liver without vessel displacement. No focal liver lesion is identified. There are gallstones within the gallbladder. The common bowel duct measures 7 mm at the pancreatic head upper limits are normal for patient age. Pancreas: Unremarkable. No pancreatic ductal dilatation or surrounding inflammatory changes. Spleen: Normal in size without focal abnormality. Adrenals/Urinary Tract: Adrenal glands are unremarkable. Kidneys are normal, without renal calculi, focal lesion, or hydronephrosis. Bladder is unremarkable. Stomach/Bowel: There are abnormal thick wall small bowel loops in the pelvis. There is 3 cm abnormal circumferential the wall of the distal descending colon. There is a 4 mm focus air which may be extraluminal adjacent to the abnormal thick wall distal descending colon. There is stranding inflammation surrounding the descending colon more prominently in the distal descending colon. There is a large hiatal hernia. Vascular/Lymphatic: Aortic atherosclerosis. No enlarged abdominal or pelvic lymph nodes. Reproductive: Uterus and bilateral adnexa are unremarkable. Other: Small amount free fluid is identified in the pelvis. Musculoskeletal: Degenerative joint changes of the spine are  noted. IMPRESSION: 3 cm length of abnormal circumferential thick walled distal descending colon with adjacent focus air which may be extraluminal. Findings could be due to for colonic neoplasm with focal perforation. There are abnormal thick wall small bowel loops in the pelvis which may be inflammatory. These results will be called to the ordering clinician or representative by the Radiologist Assistant, and communication documented in the PACS or zVision Dashboard. Electronically Signed   By: Sherian Rein M.D.   On: 04/19/2016 14:13   Ct Image Guided Drainage By Percutaneous Catheter  Result Date:  04/25/2016 INDICATION: LEFT LOWER QUADRANT DIVERTICULAR ABSCESS EXAM: CT GUIDED DRAINAGE OF DIVERTICULAR ABSCESS MEDICATIONS: The patient is currently admitted to the hospital and receiving intravenous antibiotics. The antibiotics were administered within an appropriate time frame prior to the initiation of the procedure. ANESTHESIA/SEDATION: 1.0 mg IV Versed 75 mcg IV Fentanyl Moderate Sedation Time:  13 MINUTES The patient was continuously monitored during the procedure by the interventional radiology nurse under my direct supervision. COMPLICATIONS: None immediate. TECHNIQUE: Informed written consent was obtained from the patient after a thorough discussion of the procedural risks, benefits and alternatives. All questions were addressed. Maximal Sterile Barrier Technique was utilized including caps, mask, sterile gowns, sterile gloves, sterile drape, hand hygiene and skin antiseptic. A timeout was performed prior to the initiation of the procedure. PROCEDURE: previous imaging reviewed. patient positioned supine. noncontrast localization ct performed. the left lower quadrant anterior abdominal abscess was localized with ct. overlying skin marked. under sterile conditions and local anesthesia, an 18 gauge 10 cm introducer needle was advanced from an anterior oblique approach into the left lower quadrant abdominal abscess. needle position confirmed with ct. syringe aspiration yielded exudative fecal contaminated fluid. sample sent for gram stain and culture. guidewire inserted followed by tract dilatation to insert a 10 french drain. drain catheter position confirmed with ct. catheter secured with a prolene suture and a sterile dressing. drain catheter connected to external suction bulb. patient tolerated the procedure well. No immediate complication. FINDINGS: CT imaging confirms needle placement in the left lower quadrant anterior diverticular abscess for drain placement IMPRESSION: Successful CT-guided left lower  quadrant diverticular abscess drain insertion. Electronically Signed   By: Judie Petit.  Shick M.D.   On: 04/25/2016 15:41    Time Spent in minutes 15   Rhetta Mura M.D on 04/27/2016 at 1:26 PM Pleas Koch, MD [facility] [phone]   After 7pm go to [email or web address] - password [facility]  [facility] -  Office  [phone]

## 2016-04-27 NOTE — Progress Notes (Signed)
Subjective: Appears comfortable.  Family present.  She says she still has pain left lower quadrant.  No nausea or vomiting. No stool reported Afebrile.  Heart rate 63. JP drainage watery, light brown, 35 mL last 24 hours WBC 10,100.  Hemoglobin 10.8, stable.  Objective: Vital signs in last 24 hours: Temp:  [98 F (36.7 C)-98.2 F (36.8 C)] 98.2 F (36.8 C) (01/07 0550) Pulse Rate:  [61-63] 63 (01/07 0550) Resp:  [18] 18 (01/07 0550) BP: (150-153)/(57-67) 153/67 (01/07 0550) SpO2:  [95 %-96 %] 95 % (01/07 0550) Last BM Date: 04/24/16  Intake/Output from previous day: 01/06 0701 - 01/07 0700 In: 455 [P.O.:240; IV Piggyback:200] Out: 210 [Urine:175; Drains:35] Intake/Output this shift: No intake/output data recorded.  General appearance: Alert.  Cooperative.  Doesn't appear in distress.  Coherent but some memory problems Resp: clear to auscultation bilaterally GI: Soft.  Mild tenderness left lower quadrant and around drain site.  No mass palpated.  Doesn't seem distended.  Lab Results:   Recent Labs  04/26/16 0822 04/27/16 0913  WBC 10.3 10.1  HGB 11.0* 10.8*  HCT 34.4* 33.8*  PLT 277 255   BMET  Recent Labs  04/25/16 0634 04/26/16 0822  NA 136 138  K 3.2* 3.5  CL 100* 104  CO2 24 25  GLUCOSE 111* 87  BUN 11 11  CREATININE 0.96 0.87  CALCIUM 9.3 9.0   PT/INR  Recent Labs  04/25/16 1110  LABPROT 14.1  INR 1.08   ABG No results for input(s): PHART, HCO3 in the last 72 hours.  Invalid input(s): PCO2, PO2  Studies/Results: Ct Image Guided Drainage By Percutaneous Catheter  Result Date: 04/25/2016 INDICATION: LEFT LOWER QUADRANT DIVERTICULAR ABSCESS EXAM: CT GUIDED DRAINAGE OF DIVERTICULAR ABSCESS MEDICATIONS: The patient is currently admitted to the hospital and receiving intravenous antibiotics. The antibiotics were administered within an appropriate time frame prior to the initiation of the procedure. ANESTHESIA/SEDATION: 1.0 mg IV Versed 75 mcg  IV Fentanyl Moderate Sedation Time:  13 MINUTES The patient was continuously monitored during the procedure by the interventional radiology nurse under my direct supervision. COMPLICATIONS: None immediate. TECHNIQUE: Informed written consent was obtained from the patient after a thorough discussion of the procedural risks, benefits and alternatives. All questions were addressed. Maximal Sterile Barrier Technique was utilized including caps, mask, sterile gowns, sterile gloves, sterile drape, hand hygiene and skin antiseptic. A timeout was performed prior to the initiation of the procedure. PROCEDURE: previous imaging reviewed. patient positioned supine. noncontrast localization ct performed. the left lower quadrant anterior abdominal abscess was localized with ct. overlying skin marked. under sterile conditions and local anesthesia, an 18 gauge 10 cm introducer needle was advanced from an anterior oblique approach into the left lower quadrant abdominal abscess. needle position confirmed with ct. syringe aspiration yielded exudative fecal contaminated fluid. sample sent for gram stain and culture. guidewire inserted followed by tract dilatation to insert a 10 french drain. drain catheter position confirmed with ct. catheter secured with a prolene suture and a sterile dressing. drain catheter connected to external suction bulb. patient tolerated the procedure well. No immediate complication. FINDINGS: CT imaging confirms needle placement in the left lower quadrant anterior diverticular abscess for drain placement IMPRESSION: Successful CT-guided left lower quadrant diverticular abscess drain insertion. Electronically Signed   By: Judie Petit.  Shick M.D.   On: 04/25/2016 15:41    Anti-infectives: Anti-infectives    Start     Dose/Rate Route Frequency Ordered Stop   04/20/16 0830  piperacillin-tazobactam (ZOSYN) IVPB 3.375  g     3.375 g 12.5 mL/hr over 240 Minutes Intravenous Every 8 hours 04/20/16 0825     04/20/16  0230  ciprofloxacin (CIPRO) IVPB 400 mg  Status:  Discontinued     400 mg 200 mL/hr over 60 Minutes Intravenous Every 12 hours 04/19/16 1938 04/20/16 0812   04/20/16 0000  metroNIDAZOLE (FLAGYL) IVPB 500 mg  Status:  Discontinued     500 mg 100 mL/hr over 60 Minutes Intravenous Every 8 hours 04/19/16 1938 04/20/16 0812   04/19/16 1430  metroNIDAZOLE (FLAGYL) IVPB 500 mg     500 mg 100 mL/hr over 60 Minutes Intravenous  Once 04/19/16 1428 04/19/16 1543   04/19/16 1430  ciprofloxacin (CIPRO) tablet 500 mg     500 mg Oral  Once 04/19/16 1428 04/19/16 1434      Assessment/Plan:   Acute colitis/diverticulitis with microperforation-no evidence of sepsis - WBC down slightly -CT guided drain placed 1/5 -Plan follow-up CT this week once drainage slows down. -Drain injection prior to drain removed.  Dementia Hypothyroidism Essential hypertension Urinary tract infection  ID:  Cipro 12/30>>12/31  Flagyl 12/30>>12/31 Zosyn 12/31>> FEN: allow cl liqs;  IVF  VTE: SCDs, Heparin and Plavix (held today for procedure) Currently on subcutaneous heparin for DVT prophylaxis.   LOS: 8 days    Jarrius Huaracha M 04/27/2016

## 2016-04-28 LAB — CBC
HEMATOCRIT: 35.3 % — AB (ref 36.0–46.0)
HEMOGLOBIN: 11.9 g/dL — AB (ref 12.0–15.0)
MCH: 27.4 pg (ref 26.0–34.0)
MCHC: 33.7 g/dL (ref 30.0–36.0)
MCV: 81.1 fL (ref 78.0–100.0)
Platelets: 323 10*3/uL (ref 150–400)
RBC: 4.35 MIL/uL (ref 3.87–5.11)
RDW: 13.6 % (ref 11.5–15.5)
WBC: 9.1 10*3/uL (ref 4.0–10.5)

## 2016-04-28 LAB — BASIC METABOLIC PANEL
ANION GAP: 14 (ref 5–15)
BUN: 12 mg/dL (ref 6–20)
CALCIUM: 9.4 mg/dL (ref 8.9–10.3)
CHLORIDE: 102 mmol/L (ref 101–111)
CO2: 21 mmol/L — AB (ref 22–32)
Creatinine, Ser: 1.03 mg/dL — ABNORMAL HIGH (ref 0.44–1.00)
GFR calc Af Amer: 53 mL/min — ABNORMAL LOW (ref >60)
GFR calc non Af Amer: 46 mL/min — ABNORMAL LOW (ref >60)
Glucose, Bld: 92 mg/dL (ref 65–99)
Potassium: 4 mmol/L (ref 3.5–5.1)
Sodium: 137 mmol/L (ref 135–145)

## 2016-04-28 LAB — C DIFFICILE QUICK SCREEN W PCR REFLEX
C Diff antigen: NEGATIVE
C Diff interpretation: NOT DETECTED
C Diff toxin: NEGATIVE

## 2016-04-28 NOTE — Care Management Note (Addendum)
Case Management Note  Patient Details  Name: Tara CopaMatilda Rodell MRN: [id number] Date of Birth: 1923-01-19  Subjective/Objective:     Admitted with Acute diverticulitis/colitis. From ALF.  Tim(son),[phone].  -CT scan 1/5 repeat showed LLQ diverticular abscess, drain placed 1/5  PCP: Florentina JennyHenry Tripp  Action/Plan: - f/u with abd CT and drain injection this week   - d/c to ALFwith home health services @ d/c when medically stable.  Expected Discharge Date:                  Expected Discharge Plan:  Home w Home Health Services ([facility])  In-House Referral:     Discharge planning Services  CM Consult  Post Acute Care Choice:    Choice offered to:     DME Arranged:    DME Agency:     HH Arranged:    HH Agency:     Status of Service:  In process, will continue to follow  If discussed at Long Length of Stay Meetings, dates discussed:    Additional Comments:  Epifanio LeschesCole, Joia Doyle Hudson, RN 04/28/2016, 11:07 AM

## 2016-04-28 NOTE — Progress Notes (Signed)
PROGRESS NOTE                                                                                                                                                                                                             Patient Demographics:    Tara Hicks, is a 83 y.o. female, DOB - [birth year], VHQ:[id number]  Admit date - 04/19/2016   Admitting Physician Haydee Salter, MD  Outpatient Primary MD for the patient is Florentina Jenny, MD  LOS - 9  Outpatient Specialists: none  Chief Complaint  Patient presents with  . Abdominal Pain       Brief Narrative   63 resident of [facility] assisted [facility] moderate dementia,  hypertension,  hypothyroidism  history of TIA r presented to the ED with abdominal pain.  History limited due to dementia. Vitals were stable.  CT scan of the abdomen done showed thick-walled distal descending colon with perforation.  Admitted to hospitalist service and surgery consulted. Had IR placed drain 04/25/2016 for abscess which was found on the CT scan   Subjective:   confused,  Pulling at drain Needed restraints temporarily this am. redirectable   Assessment  & Plan :   Acute diverticulitis/colitis -CT scan 1/5 repeat shows 5x3x7 abscess. -empiric IV Zosyn, IVF, bowel rest -had IR drainage of abscess 1/5 -CCS following -Stable in 10 range -morphine 2mg  q 4 prn pain -We will need to be in hospital until CT scan later this week and drainage injection prior to discharge  Essential hypertension -stable, on lisinopril At home which has been held for now, using hydralazine  History of TIA -holding for now plavix -Resumed on 04/26/2016  Dementia, moderate with acute delirium -Resides at ALF, Per son, pt recognizes some of the family members, capable of most of her ADLs. -can return to [facility] -will need sitter at bedside to ensure we can monitor her not to pull out her  drain -haldol 1 mg every 6 PRN for agitation and low dose seroquel 12.5 QHS, continue Aricept 10 daily at bedtime -son reports increased agitation with ativan  Hypokalemia -Replacing with 40 mEq's daily K dur  Acute kidney injury -due to decreased PO, resolved with IVF -Stable currently  Hypothyroid Cont synthroid 50 mcg  DVT proph: lovenox  Code Status : DO NOT RESUSCITATE Family Communication  : Discussed with  the patient's son in person 01/08 Disposition Plan  : Pending hospital course and clinical improvement SNF/ ALF rehab [facility] later this week once cleared by surgery  Consults  :   CCS  Procedures  :  CT abdomen and pelvis   Lab Results  Component Value Date   PLT 323 04/28/2016    Antibiotics  :      Objective:   Vitals:   04/27/16 1449 04/27/16 2337 04/28/16 0225 04/28/16 0456  BP: (!) 149/68 (!) 180/71 (!) 142/70 (!) 177/74  Pulse: (!) 54 65 60 71  Resp: 16 20  17   Temp: 98.3 F (36.8 C) 97.9 F (36.6 C)  97.8 F (36.6 C)  TempSrc: Oral Oral    SpO2: 97% 97%  100%  Weight:      Height:        Wt Readings from Last 3 Encounters:  04/20/16 53.7 kg (118 lb 4.8 oz)  01/28/16 53.9 kg (118 lb 13.3 oz)  11/08/15 54 kg (119 lb)     Intake/Output Summary (Last 24 hours) at 04/28/16 1550 Last data filed at 04/28/16 0800  Gross per 24 hour  Intake                5 ml  Output                5 ml  Net                0 ml     Physical Exam  Gen: Alert, oriented to self only, confused and anxious HEENT:moist mucosa Chest: CTAB CVS: N S1&S2, no murmurs,  GI: soft, non tender now, Nondistended, bowel sounds increased--drain in left lower quadrant Musculoskeletal: warm, no edema     Data Review:    CBC  Recent Labs Lab 04/23/16 0835 04/25/16 0634 04/26/16 0822 04/27/16 0913 04/28/16 0808  WBC 7.7 11.6* 10.3 10.1 9.1  HGB 11.4* 12.3 11.0* 10.8* 11.9*  HCT 35.0* 38.0 34.4* 33.8* 35.3*  PLT 270 306 277 255 323  MCV 81.2 80.2 81.7  82.4 81.1  MCH 26.5 25.9* 26.1 26.3 27.4  MCHC 32.6 32.4 32.0 32.0 33.7  RDW 13.2 13.2 13.6 14.0 13.6  LYMPHSABS  --   --   --  1.6  --   MONOABS  --   --   --  0.5  --   EOSABS  --   --   --  0.1  --   BASOSABS  --   --   --  0.0  --     Chemistries   Recent Labs Lab 04/22/16 1148 04/23/16 0811 04/25/16 0634 04/26/16 0822 04/28/16 0808  NA 136 140 136 138 137  K 3.4* 3.9 3.2* 3.5 4.0  CL 104 106 100* 104 102  CO2 20* 25 24 25  21*  GLUCOSE 92 118* 111* 87 92  BUN 28* 14 11 11 12   CREATININE 0.90 0.82 0.96 0.87 1.03*  CALCIUM 8.9 9.5 9.3 9.0 9.4   ------------------------------------------------------------------------------------------------------------------ No results for input(s): CHOL, HDL, LDLCALC, TRIG, CHOLHDL, LDLDIRECT in the last 72 hours.  No results found for: HGBA1C ------------------------------------------------------------------------------------------------------------------ No results for input(s): TSH, T4TOTAL, T3FREE, THYROIDAB in the last 72 hours.  Invalid input(s): FREET3 ------------------------------------------------------------------------------------------------------------------ No results for input(s): VITAMINB12, FOLATE, FERRITIN, TIBC, IRON, RETICCTPCT in the last 72 hours.  Coagulation profile  Recent Labs Lab 04/25/16 1110  INR 1.08    No results for input(s): DDIMER in the last 72 hours.  Cardiac Enzymes No  results for input(s): CKMB, TROPONINI, MYOGLOBIN in the last 168 hours.  Invalid input(s): CK ------------------------------------------------------------------------------------------------------------------ No results found for: BNP  Inpatient Medications  Scheduled Meds: . amLODipine  5 mg Oral Daily  . clopidogrel  75 mg Oral Daily  . donepezil  10 mg Oral QHS  . heparin  5,000 Units Subcutaneous Q8H  . levothyroxine  50 mcg Oral Once per day on Mon Wed Fri  . ondansetron (ZOFRAN) IV  4 mg Intravenous Q6H  .  piperacillin-tazobactam (ZOSYN)  IV  3.375 g Intravenous Q8H  . potassium chloride  40 mEq Oral Daily  . QUEtiapine  12.5 mg Oral QHS   Continuous Infusions:  PRN Meds:.acetaminophen **OR** acetaminophen, fentaNYL, haloperidol lactate, hydrALAZINE, midazolam, morphine injection  Micro Results Recent Results (from the past 240 hour(s))  MRSA PCR Screening     Status: None   Collection Time: 04/20/16 12:24 AM  Result Value Ref Range Status   MRSA by PCR NEGATIVE NEGATIVE Final    Comment:        The GeneXpert MRSA Assay (FDA approved for NASAL specimens only), is one component of a comprehensive MRSA colonization surveillance program. It is not intended to diagnose MRSA infection nor to guide or monitor treatment for MRSA infections.   Culture, Urine     Status: None   Collection Time: 04/21/16  4:22 PM  Result Value Ref Range Status   Specimen Description URINE, CATHETERIZED  Final   Special Requests NONE  Final   Culture NO GROWTH  Final   Report Status 04/22/2016 FINAL  Final  Aerobic/Anaerobic Culture (surgical/deep wound)     Status: None (Preliminary result)   Collection Time: 04/25/16  4:27 PM  Result Value Ref Range Status   Specimen Description ABSCESS  Final   Special Requests LLQ diverticular abscess drain  Final   Gram Stain   Final    NO WBC SEEN FEW GRAM NEGATIVE RODS RARE GRAM POSITIVE RODS RARE GRAM POSITIVE COCCI IN PAIRS    Culture   Final    FEW ESCHERICHIA COLI MODERATE CANDIDA ALBICANS NO ANAEROBES ISOLATED; CULTURE IN PROGRESS FOR 5 DAYS    Report Status PENDING  Incomplete   Organism ID, Bacteria ESCHERICHIA COLI  Final      Susceptibility   Escherichia coli - MIC*    AMPICILLIN >=32 RESISTANT Resistant     CEFAZOLIN <=4 SENSITIVE Sensitive     CEFEPIME <=1 SENSITIVE Sensitive     CEFTAZIDIME <=1 SENSITIVE Sensitive     CEFTRIAXONE <=1 SENSITIVE Sensitive     CIPROFLOXACIN >=4 RESISTANT Resistant     GENTAMICIN <=1 SENSITIVE Sensitive      IMIPENEM <=0.25 SENSITIVE Sensitive     TRIMETH/SULFA >=320 RESISTANT Resistant     AMPICILLIN/SULBACTAM 16 INTERMEDIATE Intermediate     PIP/TAZO <=4 SENSITIVE Sensitive     Extended ESBL NEGATIVE Sensitive     * FEW ESCHERICHIA COLI  C difficile quick scan w PCR reflex     Status: None   Collection Time: 04/28/16 11:50 AM  Result Value Ref Range Status   C Diff antigen NEGATIVE NEGATIVE Final   C Diff toxin NEGATIVE NEGATIVE Final   C Diff interpretation No C. difficile detected.  Final    Radiology Reports Ct Abdomen Pelvis W Contrast  Result Date: 04/25/2016 CLINICAL DATA:  Left lower quadrant pain for awhile. EXAM: CT ABDOMEN AND PELVIS WITH CONTRAST TECHNIQUE: Multidetector CT imaging of the abdomen and pelvis was performed using the standard protocol following bolus  administration of intravenous contrast. CONTRAST:  75mL ISOVUE-300 IOPAMIDOL (ISOVUE-300) INJECTION 61% COMPARISON:  04/19/2016 FINDINGS: Lower chest:  Unremarkable. Hepatobiliary: No focal abnormality within the liver parenchyma. Gallbladder is distended with several gallstones identified measuring about 5 mm each. No evidence gallbladder wall thickening or pericholecystic fluid. Extrahepatic common bile duct remains mildly distended at 10 mm. No intrahepatic biliary duct dilatation. Pancreas: Pancreas is diffusely atrophic. Spleen: No splenomegaly. No focal mass lesion. Adrenals/Urinary Tract: No adrenal nodule or mass. Kidneys are unremarkable. No evidence for hydroureter. The urinary bladder appears normal for the degree of distention. Stomach/Bowel: Moderate to large hiatal hernia is evident. Duodenal diverticulum noted in the region of the ampulla. No small bowel wall thickening. No small bowel dilatation. The terminal ileum is normal. The appendix is normal. Diverticular changes are seen scattered along the length of the colon. In the anterior left lower quadrant, in the region of the distal descending/ proximal sigmoid  colon, a 4.7 x 3.0 x 6.5 cm collection of gas and fluid is identified just deep to the rectus musculature. This is probably in the preperitoneal space for contained within the inferior omentum. Adjacent tiny contained loculated sub of free air are associated and there is some gas in the subcutaneous tissues of the overlying left lower quadrant anterior abdominal wall, potentially representing dissection through the rectus sheath or potentially reflecting overlying injection sites. Vascular/Lymphatic: There is abdominal aortic atherosclerosis without aneurysm. There is no gastrohepatic or hepatoduodenal ligament lymphadenopathy. No intraperitoneal or retroperitoneal lymphadenopathy. No pelvic sidewall lymphadenopathy. Reproductive: The uterus has normal CT imaging appearance. There is no adnexal mass. Other: No intraperitoneal free fluid. Musculoskeletal: Bone windows reveal no worrisome lytic or sclerotic osseous lesions. Degenerative changes noted lower lumbar spine. IMPRESSION: 1. The wall thickening seen previously in the distal descending colon near the junction with the sigmoid segment has decreased in the interval although pericolonic edema/inflammation does persist and there is now a 5 x 3 x 7 cm abscess just anterior to this segment of colon, localized either in the preperitoneal fat or lower omentum. Changes most likely represent perforated diverticulitis with interval development of para colonic abscess. Tiny gas bubbles are seen in the overlying subcutaneous fat, superficial to the rectus sheath, probably related to subcutaneous injection site although dissection of gas through the rectus sheath into the subcutaneous fat cannot be entirely excluded. 2.  Abdominal Aortic Atherosclerois (ICD10-170.0) 3. Distended gallbladder with tiny calcified gallstones and slight progression of extrahepatic biliary duct dilatation. Correlation with liver function test may prove helpful. 4. Moderate hiatal hernia.  Electronically Signed   By: Kennith Center M.D.   On: 04/25/2016 08:23   Ct Abdomen Pelvis W Contrast  Result Date: 04/19/2016 CLINICAL DATA:  Bilateral lower quadrant tenderness for 1 day EXAM: CT ABDOMEN AND PELVIS WITH CONTRAST TECHNIQUE: Multidetector CT imaging of the abdomen and pelvis was performed using the standard protocol following bolus administration of intravenous contrast. CONTRAST:  ISOVUE-300 IOPAMIDOL (ISOVUE-300) INJECTION 61% COMPARISON:  May 27, 2009 FINDINGS: Lower chest: No acute abnormality. Hepatobiliary: There is diffuse low density of liver without vessel displacement. No focal liver lesion is identified. There are gallstones within the gallbladder. The common bowel duct measures 7 mm at the pancreatic head upper limits are normal for patient age. Pancreas: Unremarkable. No pancreatic ductal dilatation or surrounding inflammatory changes. Spleen: Normal in size without focal abnormality. Adrenals/Urinary Tract: Adrenal glands are unremarkable. Kidneys are normal, without renal calculi, focal lesion, or hydronephrosis. Bladder is unremarkable. Stomach/Bowel: There are abnormal  thick wall small bowel loops in the pelvis. There is 3 cm abnormal circumferential the wall of the distal descending colon. There is a 4 mm focus air which may be extraluminal adjacent to the abnormal thick wall distal descending colon. There is stranding inflammation surrounding the descending colon more prominently in the distal descending colon. There is a large hiatal hernia. Vascular/Lymphatic: Aortic atherosclerosis. No enlarged abdominal or pelvic lymph nodes. Reproductive: Uterus and bilateral adnexa are unremarkable. Other: Small amount free fluid is identified in the pelvis. Musculoskeletal: Degenerative joint changes of the spine are noted. IMPRESSION: 3 cm length of abnormal circumferential thick walled distal descending colon with adjacent focus air which may be extraluminal. Findings could  be due to for colonic neoplasm with focal perforation. There are abnormal thick wall small bowel loops in the pelvis which may be inflammatory. These results will be called to the ordering clinician or representative by the Radiologist Assistant, and communication documented in the PACS or zVision Dashboard. Electronically Signed   By: Sherian Rein M.D.   On: 04/19/2016 14:13   Ct Image Guided Drainage By Percutaneous Catheter  Result Date: 04/25/2016 INDICATION: LEFT LOWER QUADRANT DIVERTICULAR ABSCESS EXAM: CT GUIDED DRAINAGE OF DIVERTICULAR ABSCESS MEDICATIONS: The patient is currently admitted to the hospital and receiving intravenous antibiotics. The antibiotics were administered within an appropriate time frame prior to the initiation of the procedure. ANESTHESIA/SEDATION: 1.0 mg IV Versed 75 mcg IV Fentanyl Moderate Sedation Time:  13 MINUTES The patient was continuously monitored during the procedure by the interventional radiology nurse under my direct supervision. COMPLICATIONS: None immediate. TECHNIQUE: Informed written consent was obtained from the patient after a thorough discussion of the procedural risks, benefits and alternatives. All questions were addressed. Maximal Sterile Barrier Technique was utilized including caps, mask, sterile gowns, sterile gloves, sterile drape, hand hygiene and skin antiseptic. A timeout was performed prior to the initiation of the procedure. PROCEDURE: previous imaging reviewed. patient positioned supine. noncontrast localization ct performed. the left lower quadrant anterior abdominal abscess was localized with ct. overlying skin marked. under sterile conditions and local anesthesia, an 18 gauge 10 cm introducer needle was advanced from an anterior oblique approach into the left lower quadrant abdominal abscess. needle position confirmed with ct. syringe aspiration yielded exudative fecal contaminated fluid. sample sent for gram stain and culture. guidewire  inserted followed by tract dilatation to insert a 10 french drain. drain catheter position confirmed with ct. catheter secured with a prolene suture and a sterile dressing. drain catheter connected to external suction bulb. patient tolerated the procedure well. No immediate complication. FINDINGS: CT imaging confirms needle placement in the left lower quadrant anterior diverticular abscess for drain placement IMPRESSION: Successful CT-guided left lower quadrant diverticular abscess drain insertion. Electronically Signed   By: Judie Petit.  Shick M.D.   On: 04/25/2016 15:41    Time Spent in minutes 15   Rhetta Mura M.D on 04/28/2016 at 3:50 PM Pleas Koch, MD [facility] [phone]   After 7pm go to [email or web address] - password [email or web address]  [facility] -  Office  [phone]

## 2016-04-28 NOTE — Progress Notes (Signed)
CSW inquired if ALF could take care of patient's drain. [facility] asked to speak with hospital RN for care directions to see if they could accept pt back. CSW requested RN to call ALF.  Osborne Cascoadia Presley Gora LCSWA [phone]

## 2016-04-28 NOTE — Progress Notes (Signed)
Referring Physician(s): Dr Landis GandyJ Samtani  Supervising Physician: Jolaine ClickHoss, Arthur  [facility]:  [facility]  Chief Complaint:  LLQ diverticular abscess Drain placed 1/5  Subjective:  Output minimal Drain was open to air--not charged when  I checked it today OP yellow thick fluid + Ecoli  Allergies: Phenergan [promethazine]  Medications: Prior to Admission medications   Medication Sig Start Date End Date Taking? Authorizing Provider  acetaminophen (TYLENOL) 325 MG tablet Take 2 tablets (650 mg total) by mouth every 6 (six) hours as needed for mild pain (or Fever >/= 101). Patient taking differently: Take 650 mg by mouth every 6 (six) hours as needed for fever (pain).  01/29/16  Yes Zannie CovePreetha Joseph, MD  clopidogrel (PLAVIX) 75 MG tablet Take 75 mg by mouth daily.   Yes Historical Provider, MD  donepezil (ARICEPT) 10 MG tablet Take 10 mg by mouth at bedtime.   Yes Historical Provider, MD  levothyroxine (SYNTHROID, LEVOTHROID) 50 MCG tablet Take 50 mcg by mouth every Monday, Wednesday, and Friday.    Yes Historical Provider, MD  Magnesium Hydroxide (MILK OF MAGNESIA PO) Take 30 mLs by mouth daily as needed (constipation).    Yes Historical Provider, MD  Melatonin 5 MG TABS Take 5 mg by mouth at bedtime.    Yes Historical Provider, MD  ondansetron (ZOFRAN) 4 MG tablet Take 4 mg by mouth every 4 (four) hours as needed for nausea.    Yes Historical Provider, MD  QUEtiapine (SEROQUEL) 25 MG tablet Take 25 mg by mouth at bedtime.    Yes Historical Provider, MD  vitamin B-12 (CYANOCOBALAMIN) 500 MCG tablet Take 500 mcg by mouth daily.   Yes Historical Provider, MD  HYDROcodone-acetaminophen (NORCO/VICODIN) 5-325 MG tablet Take 1 tablet by mouth every 6 (six) hours as needed for moderate pain. Patient not taking: Reported on 04/19/2016 01/29/16   Zannie CovePreetha Joseph, MD     Vital Signs: BP (!) 177/74 (BP Location: Right Arm)   Pulse 71   Temp 97.8 F (36.6 C)   Resp 17   Ht 5\' 4"   (1.626 m)   Wt 118 lb 4.8 oz (53.7 kg)   SpO2 100%   BMI 20.31 kg/m   Physical Exam  Skin: Skin is warm and dry.  Site of drain is clean and dry NT No bleeding JP not charged upon examination Recharged  Minimal OP in JP now 5 cc recorded yesterday +ecoli   Nursing note and vitals reviewed.   Imaging: Ct Abdomen Pelvis W Contrast  Result Date: 04/25/2016 CLINICAL DATA:  Left lower quadrant pain for awhile. EXAM: CT ABDOMEN AND PELVIS WITH CONTRAST TECHNIQUE: Multidetector CT imaging of the abdomen and pelvis was performed using the standard protocol following bolus administration of intravenous contrast. CONTRAST:  75mL ISOVUE-300 IOPAMIDOL (ISOVUE-300) INJECTION 61% COMPARISON:  04/19/2016 FINDINGS: Lower chest:  Unremarkable. Hepatobiliary: No focal abnormality within the liver parenchyma. Gallbladder is distended with several gallstones identified measuring about 5 mm each. No evidence gallbladder wall thickening or pericholecystic fluid. Extrahepatic common bile duct remains mildly distended at 10 mm. No intrahepatic biliary duct dilatation. Pancreas: Pancreas is diffusely atrophic. Spleen: No splenomegaly. No focal mass lesion. Adrenals/Urinary Tract: No adrenal nodule or mass. Kidneys are unremarkable. No evidence for hydroureter. The urinary bladder appears normal for the degree of distention. Stomach/Bowel: Moderate to large hiatal hernia is evident. Duodenal diverticulum noted in the region of the ampulla. No small bowel wall thickening. No small bowel dilatation. The terminal ileum is normal. The  appendix is normal. Diverticular changes are seen scattered along the length of the colon. In the anterior left lower quadrant, in the region of the distal descending/ proximal sigmoid colon, a 4.7 x 3.0 x 6.5 cm collection of gas and fluid is identified just deep to the rectus musculature. This is probably in the preperitoneal space for contained within the inferior omentum. Adjacent tiny  contained loculated sub of free air are associated and there is some gas in the subcutaneous tissues of the overlying left lower quadrant anterior abdominal wall, potentially representing dissection through the rectus sheath or potentially reflecting overlying injection sites. Vascular/Lymphatic: There is abdominal aortic atherosclerosis without aneurysm. There is no gastrohepatic or hepatoduodenal ligament lymphadenopathy. No intraperitoneal or retroperitoneal lymphadenopathy. No pelvic sidewall lymphadenopathy. Reproductive: The uterus has normal CT imaging appearance. There is no adnexal mass. Other: No intraperitoneal free fluid. Musculoskeletal: Bone windows reveal no worrisome lytic or sclerotic osseous lesions. Degenerative changes noted lower lumbar spine. IMPRESSION: 1. The wall thickening seen previously in the distal descending colon near the junction with the sigmoid segment has decreased in the interval although pericolonic edema/inflammation does persist and there is now a 5 x 3 x 7 cm abscess just anterior to this segment of colon, localized either in the preperitoneal fat or lower omentum. Changes most likely represent perforated diverticulitis with interval development of para colonic abscess. Tiny gas bubbles are seen in the overlying subcutaneous fat, superficial to the rectus sheath, probably related to subcutaneous injection site although dissection of gas through the rectus sheath into the subcutaneous fat cannot be entirely excluded. 2.  Abdominal Aortic Atherosclerois (ICD10-170.0) 3. Distended gallbladder with tiny calcified gallstones and slight progression of extrahepatic biliary duct dilatation. Correlation with liver function test may prove helpful. 4. Moderate hiatal hernia. Electronically Signed   By: Kennith Center M.D.   On: 04/25/2016 08:23   Ct Image Guided Drainage By Percutaneous Catheter  Result Date: 04/25/2016 INDICATION: LEFT LOWER QUADRANT DIVERTICULAR ABSCESS EXAM: CT  GUIDED DRAINAGE OF DIVERTICULAR ABSCESS MEDICATIONS: The patient is currently admitted to the hospital and receiving intravenous antibiotics. The antibiotics were administered within an appropriate time frame prior to the initiation of the procedure. ANESTHESIA/SEDATION: 1.0 mg IV Versed 75 mcg IV Fentanyl Moderate Sedation Time:  13 MINUTES The patient was continuously monitored during the procedure by the interventional radiology nurse under my direct supervision. COMPLICATIONS: None immediate. TECHNIQUE: Informed written consent was obtained from the patient after a thorough discussion of the procedural risks, benefits and alternatives. All questions were addressed. Maximal Sterile Barrier Technique was utilized including caps, mask, sterile gowns, sterile gloves, sterile drape, hand hygiene and skin antiseptic. A timeout was performed prior to the initiation of the procedure. PROCEDURE: previous imaging reviewed. patient positioned supine. noncontrast localization ct performed. the left lower quadrant anterior abdominal abscess was localized with ct. overlying skin marked. under sterile conditions and local anesthesia, an 18 gauge 10 cm introducer needle was advanced from an anterior oblique approach into the left lower quadrant abdominal abscess. needle position confirmed with ct. syringe aspiration yielded exudative fecal contaminated fluid. sample sent for gram stain and culture. guidewire inserted followed by tract dilatation to insert a 10 french drain. drain catheter position confirmed with ct. catheter secured with a prolene suture and a sterile dressing. drain catheter connected to external suction bulb. patient tolerated the procedure well. No immediate complication. FINDINGS: CT imaging confirms needle placement in the left lower quadrant anterior diverticular abscess for drain placement IMPRESSION: Successful CT-guided left  lower quadrant diverticular abscess drain insertion. Electronically Signed    By: Judie Petit.  Shick M.D.   On: 04/25/2016 15:41    Labs:  CBC:  Recent Labs  04/25/16 0634 04/26/16 0822 04/27/16 0913 04/28/16 0808  WBC 11.6* 10.3 10.1 9.1  HGB 12.3 11.0* 10.8* 11.9*  HCT 38.0 34.4* 33.8* 35.3*  PLT 306 277 255 323    COAGS:  Recent Labs  04/20/16 0824 04/25/16 1110  INR 1.32 1.08    BMP:  Recent Labs  04/23/16 0811 04/25/16 0634 04/26/16 0822 04/28/16 0808  NA 140 136 138 137  K 3.9 3.2* 3.5 4.0  CL 106 100* 104 102  CO2 25 24 25  21*  GLUCOSE 118* 111* 87 92  BUN 14 11 11 12   CALCIUM 9.5 9.3 9.0 9.4  CREATININE 0.82 0.96 0.87 1.03*  GFRNONAA >60 50* 57* 46*  GFRAA >60 58* >60 53*    LIVER FUNCTION TESTS:  Recent Labs  04/19/16 1235 04/20/16 0824  BILITOT 1.0 0.9  AST 18 18  ALT 14 12*  ALKPHOS 74 62  PROT 6.9 5.8*  ALBUMIN 4.0 3.0*    Assessment and Plan:  LLQ divertic abscess drain intact Will follow Will need CT to evaluate drain when output less 10 cc daily Be sure to watch JP ---needs to be charged to work adequately  Electronically Signed: Maliki Gignac A 04/28/2016, 11:03 AM   I spent a total of 15 Minutes at the the patient's bedside AND on the patient's hospital floor or unit, greater than 50% of which was counseling/coordinating care for LLQ abscess drain

## 2016-04-28 NOTE — Progress Notes (Signed)
Patient ID: Coline Calkin, female   DOB: Sep 07, 1922, 83 y.o.   MRN: [id number]  [facility] Surgery Progress Note     Subjective: Patient was confused this morning and pulled out IV, tried to pull out IR drain but only managed to open bulb. Per nurse bed was not wet therefore likely did not spill any fluid from drain. Denies any current abdominal pain. Tolerated clear liquids yesterday with no n/v. She had multiple BM's yesterday. C diff is pending.  Objective: Vital signs in last 24 hours: Temp:  [97.8 F (36.6 C)-98.3 F (36.8 C)] 97.8 F (36.6 C) (01/08 0456) Pulse Rate:  [54-71] 71 (01/08 0456) Resp:  [16-20] 17 (01/08 0456) BP: (142-180)/(68-74) 177/74 (01/08 0456) SpO2:  [97 %-100 %] 100 % (01/08 0456) Last BM Date: 04/27/16  Intake/Output from previous day: 01/07 0701 - 01/08 0700 In: 5  Out: 5 [Drains:5] Intake/Output this shift: No intake/output data recorded.  PE: Gen:  Alert, NAD, pleasant Pulm:  Effort normal Abd: Soft, ND, +BS, no HSM, no TTP, drain site clean, minimal clear/yellow/slightly purulent fluid in drainage bulb Ext:  No erythema, edema, or tenderness    Lab Results:   Recent Labs  04/26/16 0822 04/27/16 0913  WBC 10.3 10.1  HGB 11.0* 10.8*  HCT 34.4* 33.8*  PLT 277 255   BMET  Recent Labs  04/26/16 0822  NA 138  K 3.5  CL 104  CO2 25  GLUCOSE 87  BUN 11  CREATININE 0.87  CALCIUM 9.0   PT/INR  Recent Labs  04/25/16 1110  LABPROT 14.1  INR 1.08   CMP     Component Value Date/Time   NA 138 04/26/2016 0822   K 3.5 04/26/2016 0822   CL 104 04/26/2016 0822   CO2 25 04/26/2016 0822   GLUCOSE 87 04/26/2016 0822   BUN 11 04/26/2016 0822   CREATININE 0.87 04/26/2016 0822   CALCIUM 9.0 04/26/2016 0822   PROT 5.8 (L) 04/20/2016 0824   ALBUMIN 3.0 (L) 04/20/2016 0824   AST 18 04/20/2016 0824   ALT 12 (L) 04/20/2016 0824   ALKPHOS 62 04/20/2016 0824   BILITOT 0.9 04/20/2016 0824   GFRNONAA 57 (L) 04/26/2016 0822   GFRAA >60 04/26/2016 0822   Lipase     Component Value Date/Time   LIPASE 10 (L) 04/19/2016 1235       Studies/Results: No results found.  Anti-infectives: Anti-infectives    Start     Dose/Rate Route Frequency Ordered Stop   04/20/16 0830  piperacillin-tazobactam (ZOSYN) IVPB 3.375 g     3.375 g 12.5 mL/hr over 240 Minutes Intravenous Every 8 hours 04/20/16 0825     04/20/16 0230  ciprofloxacin (CIPRO) IVPB 400 mg  Status:  Discontinued     400 mg 200 mL/hr over 60 Minutes Intravenous Every 12 hours 04/19/16 1938 04/20/16 0812   04/20/16 0000  metroNIDAZOLE (FLAGYL) IVPB 500 mg  Status:  Discontinued     500 mg 100 mL/hr over 60 Minutes Intravenous Every 8 hours 04/19/16 1938 04/20/16 0812   04/19/16 1430  metroNIDAZOLE (FLAGYL) IVPB 500 mg     500 mg 100 mL/hr over 60 Minutes Intravenous  Once 04/19/16 1428 04/19/16 1543   04/19/16 1430  ciprofloxacin (CIPRO) tablet 500 mg     500 mg Oral  Once 04/19/16 1428 04/19/16 1434       Assessment/Plan Acute colitis/diverticulitis with microperforation-no evidence of sepsis -CT guided drain placed 1/5 -Plan follow-up CT this week once drainage slows  down. Drain injection prior to drain removal. - drain with 5cc/24 hr - WBC pending  Dementia Hypothyroidism Essential hypertension Urinary tract infection  ID:  Cipro 12/30>>12/31  Flagyl 12/30>>12/31 Zosyn 12/31>> FEN: IVF, clear liquids VTE: SCDs, Heparin, Plavix  Plan: WBC pending, patient afebrile. Benign abdominal exam today. Drain with only 5cc clear/yellow/slightly cloudy fluid output in 24hr. Last CT scan was performed 1/5; will discuss with MD timing of repeat CT.   LOS: 9 days    Edson SnowballBROOKE A MILLER , [facility] 04/28/2016, 8:34 AM Pager: [phone] Consults: [phone] Mon-Fri 7:00 am-4:30 pm Sat-Sun 7:00 am-11:30 am

## 2016-04-28 NOTE — Progress Notes (Addendum)
During shift change, patient became more combative and pulled out her IV and removed the dressing and stat lock holding in her JP drain. RN redressed the JP site and patient continued to pull at the JP drain. MD notified and IV team notified to place another IV line.

## 2016-04-29 ENCOUNTER — Inpatient Hospital Stay (HOSPITAL_COMMUNITY): Payer: Medicare Other

## 2016-04-29 ENCOUNTER — Encounter (HOSPITAL_COMMUNITY): Payer: Self-pay | Admitting: Radiology

## 2016-04-29 LAB — AEROBIC/ANAEROBIC CULTURE (SURGICAL/DEEP WOUND): GRAM STAIN: NONE SEEN

## 2016-04-29 LAB — AEROBIC/ANAEROBIC CULTURE W GRAM STAIN (SURGICAL/DEEP WOUND)

## 2016-04-29 MED ORDER — AMOXICILLIN-POT CLAVULANATE 875-125 MG PO TABS
1.0000 | ORAL_TABLET | Freq: Two times a day (BID) | ORAL | Status: DC
Start: 2016-04-29 — End: 2016-04-30
  Administered 2016-04-29 – 2016-04-30 (×2): 1 via ORAL
  Filled 2016-04-29 (×2): qty 1

## 2016-04-29 MED ORDER — IOPAMIDOL (ISOVUE-300) INJECTION 61%
INTRAVENOUS | Status: AC
  Administered 2016-04-29: 100 mL
  Filled 2016-04-29: qty 100

## 2016-04-29 NOTE — Care Management Important Message (Signed)
Important Message  Patient Details  Name: Tara Hicks MRN: [id number] Date of Birth: Jun 16, 1922   Medicare Important Message Given:  Yes    Bettye Sitton Abena 04/29/2016, 10:33 AM

## 2016-04-29 NOTE — Progress Notes (Signed)
PROGRESS NOTE                                                                                                                                                                                                             Patient Demographics:    Tara Hicks, is a 83 y.o. female, DOB - 1922-06-24, ZOX:[id number]  Admit date - 04/19/2016   Admitting Physician Haydee Salter, MD  Outpatient Primary MD for the patient is Florentina Jenny, MD  LOS - 10  Outpatient Specialists: none  Chief Complaint  Patient presents with  . Abdominal Pain       Brief Narrative   34 resident of [facility] assisted [facility] moderate dementia,  hypertension,  hypothyroidism  history of TIA r presented to the ED with abdominal pain.  History limited due to dementia. Vitals were stable.  CT scan of the abdomen done showed thick-walled distal descending colon with perforation.  Admitted to hospitalist service and surgery consulted. Had IR placed drain 04/25/2016 for abscess which was found on the CT scan   Subjective:   Confused pleasant smiling eating diet   Assessment  & Plan :   Acute diverticulitis/colitis -CT scan 1/5 repeat shows 5x3x7 abscess. -empiric IV Zosyn, IVF, bowel rest -had IR drainage of abscess 1/5 -CCS following -Stable in 10 range -morphine 2mg  q 4 prn pain -Repeat CT scan 04/29/16 shows no overall abscess, await drain study  Essential hypertension -stable, on lisinopril At home which has been held for now, using hydralazine  History of TIA  plavix Resumed on 04/26/2016  Dementia, moderate with acute delirium -Resides at ALF, Per son, pt recognizes some of the family members, capable of most of her ADLs. -can return to [facility] -Patient initially needed supervision as was pulling at drain however is now much more calm and oriented -haldol 1 mg every 6 PRN for agitation and low dose seroquel 12.5 QHS, continue  Aricept 10 daily at bedtime -son reports increased agitation with ativan  Hypokalemia -Replacing with 40 mEq's daily K dur  Acute kidney injury -due to decreased PO, resolved with IVF -Stable currently  Hypothyroid Cont synthroid 50 mcg  DVT proph: lovenox  Code Status : DO NOT RESUSCITATE Family Communication  : Discussed with the patient's son in person 01/08 Disposition Plan  : Pending hospital course and clinical improvement  SNF/ ALF rehab [facility] later this week once cleared by surgery  Consults  :   CCS  Procedures  :  CT abdomen and pelvis   Lab Results  Component Value Date   PLT 323 04/28/2016    Antibiotics  :      Objective:   Vitals:   04/28/16 0456 04/28/16 2142 04/29/16 0637 04/29/16 1038  BP: (!) 177/74 (!) 141/65 (!) 142/55 (!) 136/106  Pulse: 71 87 83   Resp: 17 18 18    Temp: 97.8 F (36.6 C) 98 F (36.7 C) 99.6 F (37.6 C)   TempSrc:   Oral   SpO2: 100% 94% 99%   Weight:      Height:        Wt Readings from Last 3 Encounters:  04/20/16 53.7 kg (118 lb 4.8 oz)  01/28/16 53.9 kg (118 lb 13.3 oz)  11/08/15 54 kg (119 lb)     Intake/Output Summary (Last 24 hours) at 04/29/16 1113 Last data filed at 04/29/16 9604  Gross per 24 hour  Intake                5 ml  Output                5 ml  Net                0 ml     Physical Exam  Gen: Alert, oriented to self onlyPleasant HEENT:moist mucosa Chest: CTAB CVS: N S1&S2, no murmurs,  GI: soft, non tender now, Nondistended, bowel sounds increased--drain in left lower quadrant Musculoskeletal: warm, no edema     Data Review:    CBC  Recent Labs Lab 04/23/16 0835 04/25/16 0634 04/26/16 0822 04/27/16 0913 04/28/16 0808  WBC 7.7 11.6* 10.3 10.1 9.1  HGB 11.4* 12.3 11.0* 10.8* 11.9*  HCT 35.0* 38.0 34.4* 33.8* 35.3*  PLT 270 306 277 255 323  MCV 81.2 80.2 81.7 82.4 81.1  MCH 26.5 25.9* 26.1 26.3 27.4  MCHC 32.6 32.4 32.0 32.0 33.7  RDW 13.2 13.2 13.6 14.0 13.6    LYMPHSABS  --   --   --  1.6  --   MONOABS  --   --   --  0.5  --   EOSABS  --   --   --  0.1  --   BASOSABS  --   --   --  0.0  --     Chemistries   Recent Labs Lab 04/22/16 1148 04/23/16 0811 04/25/16 0634 04/26/16 0822 04/28/16 0808  NA 136 140 136 138 137  K 3.4* 3.9 3.2* 3.5 4.0  CL 104 106 100* 104 102  CO2 20* 25 24 25  21*  GLUCOSE 92 118* 111* 87 92  BUN 28* 14 11 11 12   CREATININE 0.90 0.82 0.96 0.87 1.03*  CALCIUM 8.9 9.5 9.3 9.0 9.4   ------------------------------------------------------------------------------------------------------------------ No results for input(s): CHOL, HDL, LDLCALC, TRIG, CHOLHDL, LDLDIRECT in the last 72 hours.  No results found for: HGBA1C ------------------------------------------------------------------------------------------------------------------ No results for input(s): TSH, T4TOTAL, T3FREE, THYROIDAB in the last 72 hours.  Invalid input(s): FREET3 ------------------------------------------------------------------------------------------------------------------ No results for input(s): VITAMINB12, FOLATE, FERRITIN, TIBC, IRON, RETICCTPCT in the last 72 hours.  Coagulation profile  Recent Labs Lab 04/25/16 1110  INR 1.08    No results for input(s): DDIMER in the last 72 hours.  Cardiac Enzymes No results for input(s): CKMB, TROPONINI, MYOGLOBIN in the last 168 hours.  Invalid input(s): CK ------------------------------------------------------------------------------------------------------------------ No results found  for: BNP  Inpatient Medications  Scheduled Meds: . amLODipine  5 mg Oral Daily  . clopidogrel  75 mg Oral Daily  . donepezil  10 mg Oral QHS  . heparin  5,000 Units Subcutaneous Q8H  . levothyroxine  50 mcg Oral Once per day on Mon Wed Fri  . ondansetron (ZOFRAN) IV  4 mg Intravenous Q6H  . piperacillin-tazobactam (ZOSYN)  IV  3.375 g Intravenous Q8H  . potassium chloride  40 mEq Oral Daily  .  QUEtiapine  12.5 mg Oral QHS   Continuous Infusions:  PRN Meds:.acetaminophen **OR** acetaminophen, fentaNYL, haloperidol lactate, hydrALAZINE, midazolam, morphine injection  Micro Results Recent Results (from the past 240 hour(s))  MRSA PCR Screening     Status: None   Collection Time: 04/20/16 12:24 AM  Result Value Ref Range Status   MRSA by PCR NEGATIVE NEGATIVE Final    Comment:        The GeneXpert MRSA Assay (FDA approved for NASAL specimens only), is one component of a comprehensive MRSA colonization surveillance program. It is not intended to diagnose MRSA infection nor to guide or monitor treatment for MRSA infections.   Culture, Urine     Status: None   Collection Time: 04/21/16  4:22 PM  Result Value Ref Range Status   Specimen Description URINE, CATHETERIZED  Final   Special Requests NONE  Final   Culture NO GROWTH  Final   Report Status 04/22/2016 FINAL  Final  Aerobic/Anaerobic Culture (surgical/deep wound)     Status: None (Preliminary result)   Collection Time: 04/25/16  4:27 PM  Result Value Ref Range Status   Specimen Description ABSCESS  Final   Special Requests LLQ diverticular abscess drain  Final   Gram Stain   Final    NO WBC SEEN FEW GRAM NEGATIVE RODS RARE GRAM POSITIVE RODS RARE GRAM POSITIVE COCCI IN PAIRS    Culture   Final    FEW ESCHERICHIA COLI MODERATE CANDIDA ALBICANS NO ANAEROBES ISOLATED; CULTURE IN PROGRESS FOR 5 DAYS    Report Status PENDING  Incomplete   Organism ID, Bacteria ESCHERICHIA COLI  Final      Susceptibility   Escherichia coli - MIC*    AMPICILLIN >=32 RESISTANT Resistant     CEFAZOLIN <=4 SENSITIVE Sensitive     CEFEPIME <=1 SENSITIVE Sensitive     CEFTAZIDIME <=1 SENSITIVE Sensitive     CEFTRIAXONE <=1 SENSITIVE Sensitive     CIPROFLOXACIN >=4 RESISTANT Resistant     GENTAMICIN <=1 SENSITIVE Sensitive     IMIPENEM <=0.25 SENSITIVE Sensitive     TRIMETH/SULFA >=320 RESISTANT Resistant      AMPICILLIN/SULBACTAM 16 INTERMEDIATE Intermediate     PIP/TAZO <=4 SENSITIVE Sensitive     Extended ESBL NEGATIVE Sensitive     * FEW ESCHERICHIA COLI  C difficile quick scan w PCR reflex     Status: None   Collection Time: 04/28/16 11:50 AM  Result Value Ref Range Status   C Diff antigen NEGATIVE NEGATIVE Final   C Diff toxin NEGATIVE NEGATIVE Final   C Diff interpretation No C. difficile detected.  Final    Radiology Reports Ct Abdomen Pelvis W Contrast  Result Date: 04/29/2016 CLINICAL DATA:  Status post drainage of peritoneal abscess. EXAM: CT ABDOMEN AND PELVIS WITH CONTRAST TECHNIQUE: Multidetector CT imaging of the abdomen and pelvis was performed using the standard protocol following bolus administration of intravenous contrast. CONTRAST:  ISOVUE-300 IOPAMIDOL (ISOVUE-300) INJECTION 61% COMPARISON:  CT scan of April 25, 2016. FINDINGS: Lower chest: No acute abnormality.  Stable hiatal hernia. Hepatobiliary: Cholelithiasis is noted. No focal abnormality is noted in the liver. Pancreas: Unremarkable. No pancreatic ductal dilatation or surrounding inflammatory changes. Spleen: Normal in size without focal abnormality. Adrenals/Urinary Tract: Adrenal glands are unremarkable. Kidneys are normal, without renal calculi, focal lesion, or hydronephrosis. Bladder is unremarkable. Stomach/Bowel: Diverticulosis of descending and sigmoid colon is noted without inflammation. There is no evidence of bowel obstruction. The appendix appears normal. Vascular/Lymphatic: Aortic atherosclerosis. No enlarged abdominal or pelvic lymph nodes. Reproductive: Uterus and bilateral adnexa are unremarkable. Other: Drainage catheter is seen with tip anteriorly in the left lower quadrant of the abdomen. No significant amount of fluid remains around the catheter tip. Musculoskeletal: Multilevel degenerative disc disease is noted in the lumbar spine. IMPRESSION: Cholelithiasis. Aortic atherosclerosis. Diverticulosis of  descending and sigmoid colon without inflammation. Status post placement of drainage catheter into fluid collection in left lower quadrant of abdomen. No significant amount of fluid is noted around the catheter tip. Electronically Signed   By: Lupita Raider, M.D.   On: 04/29/2016 10:03   Ct Abdomen Pelvis W Contrast  Result Date: 04/25/2016 CLINICAL DATA:  Left lower quadrant pain for awhile. EXAM: CT ABDOMEN AND PELVIS WITH CONTRAST TECHNIQUE: Multidetector CT imaging of the abdomen and pelvis was performed using the standard protocol following bolus administration of intravenous contrast. CONTRAST:  75mL ISOVUE-300 IOPAMIDOL (ISOVUE-300) INJECTION 61% COMPARISON:  04/19/2016 FINDINGS: Lower chest:  Unremarkable. Hepatobiliary: No focal abnormality within the liver parenchyma. Gallbladder is distended with several gallstones identified measuring about 5 mm each. No evidence gallbladder wall thickening or pericholecystic fluid. Extrahepatic common bile duct remains mildly distended at 10 mm. No intrahepatic biliary duct dilatation. Pancreas: Pancreas is diffusely atrophic. Spleen: No splenomegaly. No focal mass lesion. Adrenals/Urinary Tract: No adrenal nodule or mass. Kidneys are unremarkable. No evidence for hydroureter. The urinary bladder appears normal for the degree of distention. Stomach/Bowel: Moderate to large hiatal hernia is evident. Duodenal diverticulum noted in the region of the ampulla. No small bowel wall thickening. No small bowel dilatation. The terminal ileum is normal. The appendix is normal. Diverticular changes are seen scattered along the length of the colon. In the anterior left lower quadrant, in the region of the distal descending/ proximal sigmoid colon, a 4.7 x 3.0 x 6.5 cm collection of gas and fluid is identified just deep to the rectus musculature. This is probably in the preperitoneal space for contained within the inferior omentum. Adjacent tiny contained loculated sub of free  air are associated and there is some gas in the subcutaneous tissues of the overlying left lower quadrant anterior abdominal wall, potentially representing dissection through the rectus sheath or potentially reflecting overlying injection sites. Vascular/Lymphatic: There is abdominal aortic atherosclerosis without aneurysm. There is no gastrohepatic or hepatoduodenal ligament lymphadenopathy. No intraperitoneal or retroperitoneal lymphadenopathy. No pelvic sidewall lymphadenopathy. Reproductive: The uterus has normal CT imaging appearance. There is no adnexal mass. Other: No intraperitoneal free fluid. Musculoskeletal: Bone windows reveal no worrisome lytic or sclerotic osseous lesions. Degenerative changes noted lower lumbar spine. IMPRESSION: 1. The wall thickening seen previously in the distal descending colon near the junction with the sigmoid segment has decreased in the interval although pericolonic edema/inflammation does persist and there is now a 5 x 3 x 7 cm abscess just anterior to this segment of colon, localized either in the preperitoneal fat or lower omentum. Changes most likely represent perforated diverticulitis with interval development of para colonic abscess. Tiny  gas bubbles are seen in the overlying subcutaneous fat, superficial to the rectus sheath, probably related to subcutaneous injection site although dissection of gas through the rectus sheath into the subcutaneous fat cannot be entirely excluded. 2.  Abdominal Aortic Atherosclerois (ICD10-170.0) 3. Distended gallbladder with tiny calcified gallstones and slight progression of extrahepatic biliary duct dilatation. Correlation with liver function test may prove helpful. 4. Moderate hiatal hernia. Electronically Signed   By: Kennith CenterEric  Mansell M.D.   On: 04/25/2016 08:23   Ct Abdomen Pelvis W Contrast  Result Date: 04/19/2016 CLINICAL DATA:  Bilateral lower quadrant tenderness for 1 day EXAM: CT ABDOMEN AND PELVIS WITH CONTRAST TECHNIQUE:  Multidetector CT imaging of the abdomen and pelvis was performed using the standard protocol following bolus administration of intravenous contrast. CONTRAST:  100mL ISOVUE-300 IOPAMIDOL (ISOVUE-300) INJECTION 61% COMPARISON:  May 27, 2009 FINDINGS: Lower chest: No acute abnormality. Hepatobiliary: There is diffuse low density of liver without vessel displacement. No focal liver lesion is identified. There are gallstones within the gallbladder. The common bowel duct measures 7 mm at the pancreatic head upper limits are normal for patient age. Pancreas: Unremarkable. No pancreatic ductal dilatation or surrounding inflammatory changes. Spleen: Normal in size without focal abnormality. Adrenals/Urinary Tract: Adrenal glands are unremarkable. Kidneys are normal, without renal calculi, focal lesion, or hydronephrosis. Bladder is unremarkable. Stomach/Bowel: There are abnormal thick wall small bowel loops in the pelvis. There is 3 cm abnormal circumferential the wall of the distal descending colon. There is a 4 mm focus air which may be extraluminal adjacent to the abnormal thick wall distal descending colon. There is stranding inflammation surrounding the descending colon more prominently in the distal descending colon. There is a large hiatal hernia. Vascular/Lymphatic: Aortic atherosclerosis. No enlarged abdominal or pelvic lymph nodes. Reproductive: Uterus and bilateral adnexa are unremarkable. Other: Small amount free fluid is identified in the pelvis. Musculoskeletal: Degenerative joint changes of the spine are noted. IMPRESSION: 3 cm length of abnormal circumferential thick walled distal descending colon with adjacent focus air which may be extraluminal. Findings could be due to for colonic neoplasm with focal perforation. There are abnormal thick wall small bowel loops in the pelvis which may be inflammatory. These results will be called to the ordering clinician or representative by the Radiologist Assistant,  and communication documented in the PACS or zVision Dashboard. Electronically Signed   By: Sherian ReinWei-Chen  Lin M.D.   On: 04/19/2016 14:13   Ct Image Guided Drainage By Percutaneous Catheter  Result Date: 04/25/2016 INDICATION: LEFT LOWER QUADRANT DIVERTICULAR ABSCESS EXAM: CT GUIDED DRAINAGE OF DIVERTICULAR ABSCESS MEDICATIONS: The patient is currently admitted to the hospital and receiving intravenous antibiotics. The antibiotics were administered within an appropriate time frame prior to the initiation of the procedure. ANESTHESIA/SEDATION: 1.0 mg IV Versed 75 mcg IV Fentanyl Moderate Sedation Time:  13 MINUTES The patient was continuously monitored during the procedure by the interventional radiology nurse under my direct supervision. COMPLICATIONS: None immediate. TECHNIQUE: Informed written consent was obtained from the patient after a thorough discussion of the procedural risks, benefits and alternatives. All questions were addressed. Maximal Sterile Barrier Technique was utilized including caps, mask, sterile gowns, sterile gloves, sterile drape, hand hygiene and skin antiseptic. A timeout was performed prior to the initiation of the procedure. PROCEDURE: previous imaging reviewed. patient positioned supine. noncontrast localization ct performed. the left lower quadrant anterior abdominal abscess was localized with ct. overlying skin marked. under sterile conditions and local anesthesia, an 18 gauge 10 cm introducer needle was  advanced from an anterior oblique approach into the left lower quadrant abdominal abscess. needle position confirmed with ct. syringe aspiration yielded exudative fecal contaminated fluid. sample sent for gram stain and culture. guidewire inserted followed by tract dilatation to insert a 10 french drain. drain catheter position confirmed with ct. catheter secured with a prolene suture and a sterile dressing. drain catheter connected to external suction bulb. patient tolerated the  procedure well. No immediate complication. FINDINGS: CT imaging confirms needle placement in the left lower quadrant anterior diverticular abscess for drain placement IMPRESSION: Successful CT-guided left lower quadrant diverticular abscess drain insertion. Electronically Signed   By: Judie Petit.  Shick M.D.   On: 04/25/2016 15:41    Time Spent in minutes 15   Rhetta Mura M.D on 04/29/2016 at 11:13 AM Pleas Koch, MD [facility] ([phone]   After 7pm go to [email or web address] - password [facility]  [facility] -  Office  [phone]

## 2016-04-29 NOTE — Progress Notes (Signed)
Physical Therapy Treatment Patient Details Name: Tara Hicks MRN: [id number] DOB: 02-25-1923 Today's Date: 04/29/2016    History of Present Illness Pt is a 83 y.o. female with PMHx of Dementia, GERD, HTN, TIA, Leaky heart valve presenting with abdominal pain   Cholelithiasis, drain placed in R Lower Quadrant at site of abscess.    PT Comments    Pt performed gait training better than last session. She was hesitant to get up but ambulated further than first session. Pt would benefit from continued gait training for endurance and safety. Plan next session to incorporate standing balance activities.    Follow Up Recommendations  Home health PT;Supervision for mobility/OOB     Equipment Recommendations  Rolling walker with 5" wheels    Recommendations for Other Services       Precautions / Restrictions Precautions Precautions: Fall;Other (comment) Precaution Comments: gait belt placed higher due to drain placement in R lower quadrant of abdomen.  Restrictions Weight Bearing Restrictions: No    Mobility  Bed Mobility Overal bed mobility: Modified Independent;Needs Assistance Bed Mobility: Supine to Sit;Sit to Supine     Supine to sit: Mod assist Sit to supine: Min guard   General bed mobility comments: Requird assistance to elevate trunk out of bed due to trunk and UE weakness  Transfers Overall transfer level: Needs assistance Equipment used: Rolling walker (2 wheeled) Transfers: Sit to/from Stand Sit to Stand: Min assist;Mod assist (from EOB patient needed min A but required mod A and min v/c for hand placement for getting from toilet to standing.)         General transfer comment: patient trying to pull up on walker to get off toilet, gave min v/c for using grab bar on L UE and PTA for other arm.   Ambulation/Gait Ambulation/Gait assistance: Min assist Ambulation Distance (Feet): 140 Feet (+ 10 ft) Assistive device: Rolling walker (2 wheeled) Gait  Pattern/deviations: Step-through pattern;Trunk flexed;Drifts right/left     General Gait Details: cues for RW safety to maintain space within the walker and to correct drifting, needed A with directing walker during turns within tight spaces.    Stairs            Wheelchair Mobility    Modified Rankin (Stroke Patients Only)       Balance Overall balance assessment: Needs assistance Sitting-balance support: Bilateral upper extremity supported Sitting balance-Leahy Scale: Fair     Standing balance support: Bilateral upper extremity supported Standing balance-Leahy Scale: Poor Standing balance comment: Min assist for safety                    Cognition Arousal/Alertness: Awake/alert Behavior During Therapy: WFL for tasks assessed/performed Overall Cognitive Status: History of cognitive impairments - at baseline                      Exercises      General Comments        Pertinent Vitals/Pain Faces Pain Scale: Hurts even more Pain Location: abdomen Pain Descriptors / Indicators: Discomfort Pain Intervention(s): Monitored during session;Repositioned    Home Living                      Prior Function            PT Goals (current goals can now be found in the care plan section) Acute Rehab PT Goals Potential to Achieve Goals: Good Progress towards PT goals: Progressing toward goals    Frequency  Min 3X/week      PT Plan Current plan remains appropriate    Co-evaluation             End of Session Equipment Utilized During Treatment: Gait belt Activity Tolerance: Patient tolerated treatment well Patient left: in bed;with call bell/phone within reach;with bed alarm set     Time: [id number] PT Time Calculation (min) (ACUTE ONLY): 20 min  Charges:  $Gait Training: 8-22 mins                    G Codes:      Florestine Aversimee J Masiyah Engen 04/29/2016, 1:35 PM Joycelyn RuaAimee Levert Heslop, PTA pager [phone]

## 2016-04-29 NOTE — Progress Notes (Signed)
Patient ID: Tara Hicks, female   DOB: 02/08/1923, 83 y.o.   MRN: [id number]  [facility]     Subjective: NAE over night. Patient feeling better today than yesterday. Slept well last night. Denies any current abdominal pain. Tolerating clear liquids. Had multiple BM's yesterday.  Objective: Vital signs in last 24 hours: Temp:  [98 F (36.7 C)-99.6 F (37.6 C)] 99.6 F (37.6 C) (01/09 0637) Pulse Rate:  [83-87] 83 (01/09 0637) Resp:  [18] 18 (01/09 0637) BP: (141-142)/(55-65) 142/55 (01/09 0637) SpO2:  [94 %-99 %] 99 % (01/09 0637) Last BM Date: 04/28/16  Intake/Output from previous day: 01/08 0701 - 01/09 0700 In: 5  Out: 5 [Drains:5] Intake/Output this shift: No intake/output data recorded.  PE: Gen: Alert, NAD, pleasant Pulm: Effort normal Abd: Soft, ND, +BS, no HSM, mild TTP around drain site, drain site clean, minimal clear/yellow/slightly purulent fluid in drainage bulb Ext: No erythema, edema, or tenderness    Lab Results:   Recent Labs  04/27/16 0913 04/28/16 0808  WBC 10.1 9.1  HGB 10.8* 11.9*  HCT 33.8* 35.3*  PLT 255 323   BMET  Recent Labs  04/28/16 0808  NA 137  K 4.0  CL 102  CO2 21*  GLUCOSE 92  BUN 12  CREATININE 1.03*  CALCIUM 9.4   PT/INR No results for input(s): LABPROT, INR in the last 72 hours. CMP     Component Value Date/Time   NA 137 04/28/2016 0808   K 4.0 04/28/2016 0808   CL 102 04/28/2016 0808   CO2 21 (L) 04/28/2016 0808   GLUCOSE 92 04/28/2016 0808   BUN 12 04/28/2016 0808   CREATININE 1.03 (H) 04/28/2016 0808   CALCIUM 9.4 04/28/2016 0808   PROT 5.8 (L) 04/20/2016 0824   ALBUMIN 3.0 (L) 04/20/2016 0824   AST 18 04/20/2016 0824   ALT 12 (L) 04/20/2016 0824   ALKPHOS 62 04/20/2016 0824   BILITOT 0.9 04/20/2016 0824   GFRNONAA 46 (L) 04/28/2016 0808   GFRAA 53 (L) 04/28/2016 0808   Lipase     Component Value Date/Time   LIPASE 10 (L) 04/19/2016 1235        Studies/Results: No results found.  Anti-infectives: Anti-infectives    Start     Dose/Rate Route Frequency Ordered Stop   04/20/16 0830  piperacillin-tazobactam (ZOSYN) IVPB 3.375 g     3.375 g 12.5 mL/hr over 240 Minutes Intravenous Every 8 hours 04/20/16 0825     04/20/16 0230  ciprofloxacin (CIPRO) IVPB 400 mg  Status:  Discontinued     400 mg 200 mL/hr over 60 Minutes Intravenous Every 12 hours 04/19/16 1938 04/20/16 0812   04/20/16 0000  metroNIDAZOLE (FLAGYL) IVPB 500 mg  Status:  Discontinued     500 mg 100 mL/hr over 60 Minutes Intravenous Every 8 hours 04/19/16 1938 04/20/16 0812   04/19/16 1430  metroNIDAZOLE (FLAGYL) IVPB 500 mg     500 mg 100 mL/hr over 60 Minutes Intravenous  Once 04/19/16 1428 04/19/16 1543   04/19/16 1430  ciprofloxacin (CIPRO) tablet 500 mg     500 mg Oral  Once 04/19/16 1428 04/19/16 1434       Assessment/Plan Acute colitis/diverticulitis with microperforation-no evidence of sepsis - CT guided drain placed 1/5 - WBC WNL 1/8 and drain output only 5cc/24hr - plan repeat CT scan today - Drain injection prior to drain removal.  Dementia Hypothyroidism Essential hypertension Urinary tract infection  ID:  Cipro 12/30>>12/31  Flagyl 12/30>>12/31  Zosyn 12/31>> FEN: IVF, clear liquids VTE: SCDs, Heparin, Plavix  Plan: Repeat CT scan today, will make further recommendations pending results. Per IR patient will likely need drain injection prior to drain removal. For now continue IV antibiotics and CLD.   LOS: 10 days    Edson Snowball , [facility] 04/29/2016, 8:40 AM Pager: [phone] Consults: [phone] Mon-Fri 7:00 am-4:30 pm Sat-Sun 7:00 am-11:30 am

## 2016-04-29 NOTE — Progress Notes (Signed)
Referring Physician(s): DR Gwyndolyn Kaufman  Supervising Physician: Simonne Come  Patient Status:  [facility] - In-pt  Chief Complaint:  Diverticular abscess Drain placed 1/5  Subjective:  Minimal output for 2 days But pt is agitated and demented Pulling at drain I have recharged JP 2x in 2 days Afeb; wbc wnl ecoli from abscess For CT today per chart  Allergies: Phenergan [promethazine]  Medications: Prior to Admission medications   Medication Sig Start Date End Date Taking? Authorizing Provider  acetaminophen (TYLENOL) 325 MG tablet Take 2 tablets (650 mg total) by mouth every 6 (six) hours as needed for mild pain (or Fever >/= 101). Patient taking differently: Take 650 mg by mouth every 6 (six) hours as needed for fever (pain).  01/29/16  Yes Zannie Cove, MD  clopidogrel (PLAVIX) 75 MG tablet Take 75 mg by mouth daily.   Yes Historical Provider, MD  donepezil (ARICEPT) 10 MG tablet Take 10 mg by mouth at bedtime.   Yes Historical Provider, MD  levothyroxine (SYNTHROID, LEVOTHROID) 50 MCG tablet Take 50 mcg by mouth every Monday, Wednesday, and Friday.    Yes Historical Provider, MD  Magnesium Hydroxide (MILK OF MAGNESIA PO) Take 30 mLs by mouth daily as needed (constipation).    Yes Historical Provider, MD  Melatonin 5 MG TABS Take 5 mg by mouth at bedtime.    Yes Historical Provider, MD  ondansetron (ZOFRAN) 4 MG tablet Take 4 mg by mouth every 4 (four) hours as needed for nausea.    Yes Historical Provider, MD  QUEtiapine (SEROQUEL) 25 MG tablet Take 25 mg by mouth at bedtime.    Yes Historical Provider, MD  vitamin B-12 (CYANOCOBALAMIN) 500 MCG tablet Take 500 mcg by mouth daily.   Yes Historical Provider, MD  HYDROcodone-acetaminophen (NORCO/VICODIN) 5-325 MG tablet Take 1 tablet by mouth every 6 (six) hours as needed for moderate pain. Patient not taking: Reported on 04/19/2016 01/29/16   Zannie Cove, MD     Vital Signs: BP (!) 142/55 (BP Location: Left Arm)   Pulse 83    Temp 99.6 F (37.6 C) (Oral)   Resp 18   Ht 5\' 4"  (1.626 m)   Wt 118 lb 4.8 oz (53.7 kg)   SpO2 99%   BMI 20.31 kg/m   Physical Exam  Abdominal: Soft. Bowel sounds are normal.  Musculoskeletal: Normal range of motion.  Neurological: She is alert.  Dementia Pulls as drain per RN   Skin: Skin is warm and dry.  Site of drain is clean and dry NT no bleeding OP is yellow creamy fluid Minimal OP for few days now +Ecoli  Have found JP uncharged 2x now    Nursing note and vitals reviewed.   Imaging: Ct Abdomen Pelvis W Contrast  Result Date: 04/25/2016 CLINICAL DATA:  Left lower quadrant pain for awhile. EXAM: CT ABDOMEN AND PELVIS WITH CONTRAST TECHNIQUE: Multidetector CT imaging of the abdomen and pelvis was performed using the standard protocol following bolus administration of intravenous contrast. CONTRAST:  75mL ISOVUE-300 IOPAMIDOL (ISOVUE-300) INJECTION 61% COMPARISON:  04/19/2016 FINDINGS: Lower chest:  Unremarkable. Hepatobiliary: No focal abnormality within the liver parenchyma. Gallbladder is distended with several gallstones identified measuring about 5 mm each. No evidence gallbladder wall thickening or pericholecystic fluid. Extrahepatic common bile duct remains mildly distended at 10 mm. No intrahepatic biliary duct dilatation. Pancreas: Pancreas is diffusely atrophic. Spleen: No splenomegaly. No focal mass lesion. Adrenals/Urinary Tract: No adrenal nodule or mass. Kidneys are unremarkable. No evidence for hydroureter.  The urinary bladder appears normal for the degree of distention. Stomach/Bowel: Moderate to large hiatal hernia is evident. Duodenal diverticulum noted in the region of the ampulla. No small bowel wall thickening. No small bowel dilatation. The terminal ileum is normal. The appendix is normal. Diverticular changes are seen scattered along the length of the colon. In the anterior left lower quadrant, in the region of the distal descending/ proximal sigmoid  colon, a 4.7 x 3.0 x 6.5 cm collection of gas and fluid is identified just deep to the rectus musculature. This is probably in the preperitoneal space for contained within the inferior omentum. Adjacent tiny contained loculated sub of free air are associated and there is some gas in the subcutaneous tissues of the overlying left lower quadrant anterior abdominal wall, potentially representing dissection through the rectus sheath or potentially reflecting overlying injection sites. Vascular/Lymphatic: There is abdominal aortic atherosclerosis without aneurysm. There is no gastrohepatic or hepatoduodenal ligament lymphadenopathy. No intraperitoneal or retroperitoneal lymphadenopathy. No pelvic sidewall lymphadenopathy. Reproductive: The uterus has normal CT imaging appearance. There is no adnexal mass. Other: No intraperitoneal free fluid. Musculoskeletal: Bone windows reveal no worrisome lytic or sclerotic osseous lesions. Degenerative changes noted lower lumbar spine. IMPRESSION: 1. The wall thickening seen previously in the distal descending colon near the junction with the sigmoid segment has decreased in the interval although pericolonic edema/inflammation does persist and there is now a 5 x 3 x 7 cm abscess just anterior to this segment of colon, localized either in the preperitoneal fat or lower omentum. Changes most likely represent perforated diverticulitis with interval development of para colonic abscess. Tiny gas bubbles are seen in the overlying subcutaneous fat, superficial to the rectus sheath, probably related to subcutaneous injection site although dissection of gas through the rectus sheath into the subcutaneous fat cannot be entirely excluded. 2.  Abdominal Aortic Atherosclerois (ICD10-170.0) 3. Distended gallbladder with tiny calcified gallstones and slight progression of extrahepatic biliary duct dilatation. Correlation with liver function test may prove helpful. 4. Moderate hiatal hernia.  Electronically Signed   By: Kennith Center M.D.   On: 04/25/2016 08:23   Ct Image Guided Drainage By Percutaneous Catheter  Result Date: 04/25/2016 INDICATION: LEFT LOWER QUADRANT DIVERTICULAR ABSCESS EXAM: CT GUIDED DRAINAGE OF DIVERTICULAR ABSCESS MEDICATIONS: The patient is currently admitted to the hospital and receiving intravenous antibiotics. The antibiotics were administered within an appropriate time frame prior to the initiation of the procedure. ANESTHESIA/SEDATION: 1.0 mg IV Versed 75 mcg IV Fentanyl Moderate Sedation Time:  13 MINUTES The patient was continuously monitored during the procedure by the interventional radiology nurse under my direct supervision. COMPLICATIONS: None immediate. TECHNIQUE: Informed written consent was obtained from the patient after a thorough discussion of the procedural risks, benefits and alternatives. All questions were addressed. Maximal Sterile Barrier Technique was utilized including caps, mask, sterile gowns, sterile gloves, sterile drape, hand hygiene and skin antiseptic. A timeout was performed prior to the initiation of the procedure. PROCEDURE: previous imaging reviewed. patient positioned supine. noncontrast localization ct performed. the left lower quadrant anterior abdominal abscess was localized with ct. overlying skin marked. under sterile conditions and local anesthesia, an 18 gauge 10 cm introducer needle was advanced from an anterior oblique approach into the left lower quadrant abdominal abscess. needle position confirmed with ct. syringe aspiration yielded exudative fecal contaminated fluid. sample sent for gram stain and culture. guidewire inserted followed by tract dilatation to insert a 10 french drain. drain catheter position confirmed with ct. catheter secured with a  prolene suture and a sterile dressing. drain catheter connected to external suction bulb. patient tolerated the procedure well. No immediate complication. FINDINGS: CT imaging  confirms needle placement in the left lower quadrant anterior diverticular abscess for drain placement IMPRESSION: Successful CT-guided left lower quadrant diverticular abscess drain insertion. Electronically Signed   By: Judie PetitM.  Shick M.D.   On: 04/25/2016 15:41    Labs:  CBC:  Recent Labs  04/25/16 0634 04/26/16 0822 04/27/16 0913 04/28/16 0808  WBC 11.6* 10.3 10.1 9.1  HGB 12.3 11.0* 10.8* 11.9*  HCT 38.0 34.4* 33.8* 35.3*  PLT 306 277 255 323    COAGS:  Recent Labs  04/20/16 0824 04/25/16 1110  INR 1.32 1.08    BMP:  Recent Labs  04/23/16 0811 04/25/16 0634 04/26/16 0822 04/28/16 0808  NA 140 136 138 137  K 3.9 3.2* 3.5 4.0  CL 106 100* 104 102  CO2 25 24 25  21*  GLUCOSE 118* 111* 87 92  BUN 14 11 11 12   CALCIUM 9.5 9.3 9.0 9.4  CREATININE 0.82 0.96 0.87 1.03*  GFRNONAA >60 50* 57* 46*  GFRAA >60 58* >60 53*    LIVER FUNCTION TESTS:  Recent Labs  04/19/16 1235 04/20/16 0824  BILITOT 1.0 0.9  AST 18 18  ALT 14 12*  ALKPHOS 74 62  PROT 6.9 5.8*  ALBUMIN 4.0 3.0*    Assessment and Plan:  Diverticular abscess drain intact Recharged JP Await CT result Will probably need drain inj before removal   Electronically Signed: Thaddeus Evitts A 04/29/2016, 7:48 AM   I spent a total of 15 Minutes at the the patient's bedside AND on the patient's hospital floor or unit, greater than 50% of which was counseling/coordinating care for divertic abscess drain

## 2016-04-29 NOTE — Progress Notes (Signed)
Patient ID: Tara Hicks, female   DOB: Jan 06, 1923, 83 y.o.   MRN: [id number]  CT scan reviewed with Dr. Corliss Skainssuei: "Status post placement of drainage catheter into fluid collection in left lower quadrant of abdomen. No significant amount of fluid is noted around the catheter tip."  Abscess resolved. Ok for IR to remove drain.  BROOKE A MILLER

## 2016-04-30 ENCOUNTER — Encounter (HOSPITAL_COMMUNITY): Payer: Self-pay | Admitting: Interventional Radiology

## 2016-04-30 ENCOUNTER — Inpatient Hospital Stay (HOSPITAL_COMMUNITY): Payer: Medicare Other

## 2016-04-30 DIAGNOSIS — K5720 Diverticulitis of large intestine with perforation and abscess without bleeding: Principal | ICD-10-CM

## 2016-04-30 DIAGNOSIS — I10 Essential (primary) hypertension: Secondary | ICD-10-CM

## 2016-04-30 HISTORY — PX: IR GENERIC HISTORICAL: IMG1180011

## 2016-04-30 MED ORDER — AMOXICILLIN-POT CLAVULANATE 500-125 MG PO TABS
1.0000 | ORAL_TABLET | Freq: Two times a day (BID) | ORAL | Status: DC
  Filled 2016-04-30 (×2): qty 1

## 2016-04-30 MED ORDER — IOPAMIDOL (ISOVUE-300) INJECTION 61%
INTRAVENOUS | Status: AC
  Administered 2016-04-30: 5 mL
  Filled 2016-04-30: qty 50

## 2016-04-30 MED ORDER — DEXTROSE 5 % IV SOLN
2.0000 g | INTRAVENOUS | Status: DC
  Administered 2016-04-30: 2 g via INTRAVENOUS
  Filled 2016-04-30 (×2): qty 2

## 2016-04-30 NOTE — Progress Notes (Signed)
Referring Physician(s): Dr Gwyndolyn KaufmanM Tsuei  Supervising Physician: Ruel FavorsShick, Trevor  [facility]  Chief Complaint:  divertic abscess drain placed 1/5  Subjective:  + ecoli Has drained well Pt feels better daily Minimal output x days now CT 1/9: IMPRESSION: Cholelithiasis. Aortic atherosclerosis. Diverticulosis of descending and sigmoid colon without inflammation. Status post placement of drainage catheter into fluid collection in left lower quadrant of abdomen. No significant amount of fluid is noted around the catheter tip.  Will get drain injection---if no communication---will remove drain   Allergies: Phenergan [promethazine]  Medications: Prior to Admission medications   Medication Sig Start Date End Date Taking? Authorizing Provider  acetaminophen (TYLENOL) 325 MG tablet Take 2 tablets (650 mg total) by mouth every 6 (six) hours as needed for mild pain (or Fever >/= 101). Patient taking differently: Take 650 mg by mouth every 6 (six) hours as needed for fever (pain).  01/29/16  Yes Zannie CovePreetha Joseph, MD  clopidogrel (PLAVIX) 75 MG tablet Take 75 mg by mouth daily.   Yes Historical Provider, MD  donepezil (ARICEPT) 10 MG tablet Take 10 mg by mouth at bedtime.   Yes Historical Provider, MD  levothyroxine (SYNTHROID, LEVOTHROID) 50 MCG tablet Take 50 mcg by mouth every Monday, Wednesday, and Friday.    Yes Historical Provider, MD  Magnesium Hydroxide (MILK OF MAGNESIA PO) Take 30 mLs by mouth daily as needed (constipation).    Yes Historical Provider, MD  Melatonin 5 MG TABS Take 5 mg by mouth at bedtime.    Yes Historical Provider, MD  ondansetron (ZOFRAN) 4 MG tablet Take 4 mg by mouth every 4 (four) hours as needed for nausea.    Yes Historical Provider, MD  QUEtiapine (SEROQUEL) 25 MG tablet Take 25 mg by mouth at bedtime.    Yes Historical Provider, MD  vitamin B-12 (CYANOCOBALAMIN) 500 MCG tablet Take 500 mcg by mouth daily.   Yes Historical Provider, MD    HYDROcodone-acetaminophen (NORCO/VICODIN) 5-325 MG tablet Take 1 tablet by mouth every 6 (six) hours as needed for moderate pain. Patient not taking: Reported on 04/19/2016 01/29/16   Zannie CovePreetha Joseph, MD     Vital Signs: BP (!) 127/59   Pulse (!) 58   Temp 98.2 F (36.8 C) (Oral)   Resp 18   Ht 5\' 4"  (1.626 m)   Wt 118 lb 4.8 oz (53.7 kg)   SpO2 97%   BMI 20.31 kg/m   Physical Exam  Musculoskeletal: Normal range of motion.  Neurological: She is alert.  Skin: Skin is warm and dry.  Site clean and dry NT No bleeding Output minimal  For drain inj today Poss removal  Nursing note and vitals reviewed.   Imaging: Ct Abdomen Pelvis W Contrast  Result Date: 04/29/2016 CLINICAL DATA:  Status post drainage of peritoneal abscess. EXAM: CT ABDOMEN AND PELVIS WITH CONTRAST TECHNIQUE: Multidetector CT imaging of the abdomen and pelvis was performed using the standard protocol following bolus administration of intravenous contrast. CONTRAST:  100mL ISOVUE-300 IOPAMIDOL (ISOVUE-300) INJECTION 61% COMPARISON:  CT scan of April 25, 2016. FINDINGS: Lower chest: No acute abnormality.  Stable hiatal hernia. Hepatobiliary: Cholelithiasis is noted. No focal abnormality is noted in the liver. Pancreas: Unremarkable. No pancreatic ductal dilatation or surrounding inflammatory changes. Spleen: Normal in size without focal abnormality. Adrenals/Urinary Tract: Adrenal glands are unremarkable. Kidneys are normal, without renal calculi, focal lesion, or hydronephrosis. Bladder is unremarkable. Stomach/Bowel: Diverticulosis of descending and sigmoid colon is noted without inflammation. There  is no evidence of bowel obstruction. The appendix appears normal. Vascular/Lymphatic: Aortic atherosclerosis. No enlarged abdominal or pelvic lymph nodes. Reproductive: Uterus and bilateral adnexa are unremarkable. Other: Drainage catheter is seen with tip anteriorly in the left lower quadrant of the abdomen. No significant  amount of fluid remains around the catheter tip. Musculoskeletal: Multilevel degenerative disc disease is noted in the lumbar spine. IMPRESSION: Cholelithiasis. Aortic atherosclerosis. Diverticulosis of descending and sigmoid colon without inflammation. Status post placement of drainage catheter into fluid collection in left lower quadrant of abdomen. No significant amount of fluid is noted around the catheter tip. Electronically Signed   By: Lupita Raider, M.D.   On: 04/29/2016 10:03    Labs:  CBC:  Recent Labs  04/25/16 0634 04/26/16 0822 04/27/16 0913 04/28/16 0808  WBC 11.6* 10.3 10.1 9.1  HGB 12.3 11.0* 10.8* 11.9*  HCT 38.0 34.4* 33.8* 35.3*  PLT 306 277 255 323    COAGS:  Recent Labs  04/20/16 0824 04/25/16 1110  INR 1.32 1.08    BMP:  Recent Labs  04/23/16 0811 04/25/16 0634 04/26/16 0822 04/28/16 0808  NA 140 136 138 137  K 3.9 3.2* 3.5 4.0  CL 106 100* 104 102  CO2 25 24 25  21*  GLUCOSE 118* 111* 87 92  BUN 14 11 11 12   CALCIUM 9.5 9.3 9.0 9.4  CREATININE 0.82 0.96 0.87 1.03*  GFRNONAA >60 50* 57* 46*  GFRAA >60 58* >60 53*    LIVER FUNCTION TESTS:  Recent Labs  04/19/16 1235 04/20/16 0824  BILITOT 1.0 0.9  AST 18 18  ALT 14 12*  ALKPHOS 74 62  PROT 6.9 5.8*  ALBUMIN 4.0 3.0*    Assessment and Plan:  Diverticular abscess drain placed 1/5 OP minimal for days CT - shows resolved abscess wnc wnl afeb -----for drain injection and poss removal today  Electronically Signed: Daton Szilagyi A 04/30/2016, 10:12 AM   I spent a total of 15 Minutes at the the patient's bedside AND on the patient's hospital floor or unit, greater than 50% of which was counseling/coordinating care for divertic abscess drain

## 2016-04-30 NOTE — Progress Notes (Signed)
PROGRESS NOTE                                                                                                                                                                                                             Patient Demographics:    Tara Hicks, is a 83 y.o. female, DOB - 1923/02/07, ZOX:[id number]  Admit date - 04/19/2016   Admitting Physician Haydee Salter, MD  Outpatient Primary MD for the patient is Florentina Jenny, MD  LOS - 11  Outpatient Specialists: none  Chief Complaint  Patient presents with  . Abdominal Pain       Brief Narrative   48 resident of [facility] assisted [facility] moderate dementia,  hypertension,  hypothyroidism  history of TIA r presented to the ED with abdominal pain.  History limited due to dementia. Vitals were stable.  CT scan of the abdomen done showed thick-walled distal descending colon with perforation.  Admitted to hospitalist service and surgery consulted. Had IR placed drain 04/25/2016 for abscess which was found on the CT scan   Subjective:   Confused pleasant smiling eating diet. Denied abdominal pain. Did not even realize that she had abdominal drain.   Assessment  & Plan :   Acute diverticulitis/colitis -CT scan 1/5 repeat shows 5x3x7 abscess. -empiric IV Zosyn, IVF, bowel rest -had IR drainage of abscess 1/5 -[facility] following -Repeat CT scan 04/29/16 shows no overall abscess, await drain study - [facility] requested IR for drain removal but was advised by RN that drain is to remain in place for 3-5 days. Discussed with IR in a.m. regarding outpatient follow-up. -Abscess culture shows Escherichia coli , Sensitive to Zosyn but resistant to ampicillin and intermediate to Unasyn. Resistant to Cipro and Bactrim. Currently on Augmentin but based upon sensitivity, DC. Start Rocephin until d/w ID in am.  Essential hypertension -stable, on lisinopril At home which has been held  for now, using hydralazine  History of TIA  plavix Resumed on 04/26/2016  Dementia, moderate with acute delirium -Resides at ALF, Per son, pt recognizes some of the family members, capable of most of her ADLs. -can return to [facility]F -Patient initially needed supervision as was pulling at drain however is now much more calm and oriented -haldol 1 mg every 6 PRN for agitation and low dose seroquel 12.5 QHS, continue Aricept 10 daily  at bedtime -son reports increased agitation with ativan  Hypokalemia -Replaced  Acute kidney injury -due to decreased PO, resolved with IVF -Stable currently  Hypothyroid Cont synthroid 50 mcg  DVT proph: lovenox  Code Status : DO NOT RESUSCITATE Family Communication  : None at bedside Disposition Plan  : Pending hospital course and clinical improvement SNF/ ALF rehab [facility] later this week once cleared by surgery. Likely DC 1/11  Consults  :   CCS IR  Procedures  :  CT abdomen and pelvis   Lab Results  Component Value Date   PLT 323 04/28/2016    Antibiotics  :      Objective:   Vitals:   04/29/16 1512 04/29/16 2204 04/30/16 0504 04/30/16 0856  BP: 132/62 (!) 144/67 127/65 (!) 127/59  Pulse: 64 61 (!) 58   Resp: 20 17 18    Temp: 98.2 F (36.8 C) 98.2 F (36.8 C) 98.2 F (36.8 C)   TempSrc: Oral Oral Oral   SpO2: 98% 97% 97%   Weight:      Height:        Wt Readings from Last 3 Encounters:  04/20/16 53.7 kg (118 lb 4.8 oz)  01/28/16 53.9 kg (118 lb 13.3 oz)  11/08/15 54 kg (119 lb)    No intake or output data in the 24 hours ending 04/30/16 1928   Physical Exam  Gen: Alert, oriented to self onlyPleasant HEENT:moist mucosa Chest: CTAB. No increased work of breathing.  CVS: N S1&S2, no murmurs,  GI: soft, non tender now, Nondistended, bowel sounds increased--drain in left lower quadrant-Not draining much.  Musculoskeletal: warm, no edema     Data Review:    CBC  Recent Labs Lab 04/25/16 0634  04/26/16 0822 04/27/16 0913 04/28/16 0808  WBC 11.6* 10.3 10.1 9.1  HGB 12.3 11.0* 10.8* 11.9*  HCT 38.0 34.4* 33.8* 35.3*  PLT 306 277 255 323  MCV 80.2 81.7 82.4 81.1  MCH 25.9* 26.1 26.3 27.4  MCHC 32.4 32.0 32.0 33.7  RDW 13.2 13.6 14.0 13.6  LYMPHSABS  --   --  1.6  --   MONOABS  --   --  0.5  --   EOSABS  --   --  0.1  --   BASOSABS  --   --  0.0  --     Chemistries   Recent Labs Lab 04/25/16 0634 04/26/16 0822 04/28/16 0808  NA 136 138 137  K 3.2* 3.5 4.0  CL 100* 104 102  CO2 24 25 21*  GLUCOSE 111* 87 92  BUN 11 11 12   CREATININE 0.96 0.87 1.03*  CALCIUM 9.3 9.0 9.4   ------------------------------------------------------------------------------------------------------------------ No results for input(s): CHOL, HDL, LDLCALC, TRIG, CHOLHDL, LDLDIRECT in the last 72 hours.  No results found for: HGBA1C ------------------------------------------------------------------------------------------------------------------ No results for input(s): TSH, T4TOTAL, T3FREE, THYROIDAB in the last 72 hours.  Invalid input(s): FREET3 ------------------------------------------------------------------------------------------------------------------ No results for input(s): VITAMINB12, FOLATE, FERRITIN, TIBC, IRON, RETICCTPCT in the last 72 hours.  Coagulation profile  Recent Labs Lab 04/25/16 1110  INR 1.08    No results for input(s): DDIMER in the last 72 hours.  Cardiac Enzymes No results for input(s): CKMB, TROPONINI, MYOGLOBIN in the last 168 hours.  Invalid input(s): CK ------------------------------------------------------------------------------------------------------------------ No results found for: BNP  Inpatient Medications  Scheduled Meds: . amLODipine  5 mg Oral Daily  . amoxicillin-clavulanate  1 tablet Oral Q12H  . clopidogrel  75 mg Oral Daily  . donepezil  10 mg Oral QHS  .  heparin  5,000 Units Subcutaneous Q8H  . levothyroxine  50 mcg Oral  Once per day on Mon Wed Fri  . ondansetron (ZOFRAN) IV  4 mg Intravenous Q6H  . potassium chloride  40 mEq Oral Daily  . QUEtiapine  12.5 mg Oral QHS   Continuous Infusions:  PRN Meds:.acetaminophen **OR** acetaminophen, fentaNYL, haloperidol lactate, hydrALAZINE, midazolam, morphine injection  Micro Results Recent Results (from the past 240 hour(s))  Culture, Urine     Status: None   Collection Time: 04/21/16  4:22 PM  Result Value Ref Range Status   Specimen Description URINE, CATHETERIZED  Final   Special Requests NONE  Final   Culture NO GROWTH  Final   Report Status 04/22/2016 FINAL  Final  Aerobic/Anaerobic Culture (surgical/deep wound)     Status: None   Collection Time: 04/25/16  4:27 PM  Result Value Ref Range Status   Specimen Description ABSCESS  Final   Special Requests LLQ diverticular abscess drain  Final   Gram Stain   Final    NO WBC SEEN FEW GRAM NEGATIVE RODS RARE GRAM POSITIVE RODS RARE GRAM POSITIVE COCCI IN PAIRS    Culture   Final    FEW ESCHERICHIA COLI MODERATE CANDIDA ALBICANS NO ANAEROBES ISOLATED    Report Status 04/29/2016 FINAL  Final   Organism ID, Bacteria ESCHERICHIA COLI  Final      Susceptibility   Escherichia coli - MIC*    AMPICILLIN >=32 RESISTANT Resistant     CEFAZOLIN <=4 SENSITIVE Sensitive     CEFEPIME <=1 SENSITIVE Sensitive     CEFTAZIDIME <=1 SENSITIVE Sensitive     CEFTRIAXONE <=1 SENSITIVE Sensitive     CIPROFLOXACIN >=4 RESISTANT Resistant     GENTAMICIN <=1 SENSITIVE Sensitive     IMIPENEM <=0.25 SENSITIVE Sensitive     TRIMETH/SULFA >=320 RESISTANT Resistant     AMPICILLIN/SULBACTAM 16 INTERMEDIATE Intermediate     PIP/TAZO <=4 SENSITIVE Sensitive     Extended ESBL NEGATIVE Sensitive     * FEW ESCHERICHIA COLI  C difficile quick scan w PCR reflex     Status: None   Collection Time: 04/28/16 11:50 AM  Result Value Ref Range Status   C Diff antigen NEGATIVE NEGATIVE Final   C Diff toxin NEGATIVE NEGATIVE Final   C  Diff interpretation No C. difficile detected.  Final    Radiology Reports Ct Abdomen Pelvis W Contrast  Result Date: 04/29/2016 CLINICAL DATA:  Status post drainage of peritoneal abscess. EXAM: CT ABDOMEN AND PELVIS WITH CONTRAST TECHNIQUE: Multidetector CT imaging of the abdomen and pelvis was performed using the standard protocol following bolus administration of intravenous contrast. CONTRAST:  ISOVUE-300 IOPAMIDOL (ISOVUE-300) INJECTION 61% COMPARISON:  CT scan of April 25, 2016. FINDINGS: Lower chest: No acute abnormality.  Stable hiatal hernia. Hepatobiliary: Cholelithiasis is noted. No focal abnormality is noted in the liver. Pancreas: Unremarkable. No pancreatic ductal dilatation or surrounding inflammatory changes. Spleen: Normal in size without focal abnormality. Adrenals/Urinary Tract: Adrenal glands are unremarkable. Kidneys are normal, without renal calculi, focal lesion, or hydronephrosis. Bladder is unremarkable. Stomach/Bowel: Diverticulosis of descending and sigmoid colon is noted without inflammation. There is no evidence of bowel obstruction. The appendix appears normal. Vascular/Lymphatic: Aortic atherosclerosis. No enlarged abdominal or pelvic lymph nodes. Reproductive: Uterus and bilateral adnexa are unremarkable. Other: Drainage catheter is seen with tip anteriorly in the left lower quadrant of the abdomen. No significant amount of fluid remains around the catheter tip. Musculoskeletal: Multilevel degenerative disc disease is noted  in the lumbar spine. IMPRESSION: Cholelithiasis. Aortic atherosclerosis. Diverticulosis of descending and sigmoid colon without inflammation. Status post placement of drainage catheter into fluid collection in left lower quadrant of abdomen. No significant amount of fluid is noted around the catheter tip. Electronically Signed   By: Lupita Raider, M.D.   On: 04/29/2016 10:03   Ct Abdomen Pelvis W Contrast  Result Date: 04/25/2016 CLINICAL DATA:   Left lower quadrant pain for awhile. EXAM: CT ABDOMEN AND PELVIS WITH CONTRAST TECHNIQUE: Multidetector CT imaging of the abdomen and pelvis was performed using the standard protocol following bolus administration of intravenous contrast. CONTRAST:  75mL ISOVUE-300 IOPAMIDOL (ISOVUE-300) INJECTION 61% COMPARISON:  04/19/2016 FINDINGS: Lower chest:  Unremarkable. Hepatobiliary: No focal abnormality within the liver parenchyma. Gallbladder is distended with several gallstones identified measuring about 5 mm each. No evidence gallbladder wall thickening or pericholecystic fluid. Extrahepatic common bile duct remains mildly distended at 10 mm. No intrahepatic biliary duct dilatation. Pancreas: Pancreas is diffusely atrophic. Spleen: No splenomegaly. No focal mass lesion. Adrenals/Urinary Tract: No adrenal nodule or mass. Kidneys are unremarkable. No evidence for hydroureter. The urinary bladder appears normal for the degree of distention. Stomach/Bowel: Moderate to large hiatal hernia is evident. Duodenal diverticulum noted in the region of the ampulla. No small bowel wall thickening. No small bowel dilatation. The terminal ileum is normal. The appendix is normal. Diverticular changes are seen scattered along the length of the colon. In the anterior left lower quadrant, in the region of the distal descending/ proximal sigmoid colon, a 4.7 x 3.0 x 6.5 cm collection of gas and fluid is identified just deep to the rectus musculature. This is probably in the preperitoneal space for contained within the inferior omentum. Adjacent tiny contained loculated sub of free air are associated and there is some gas in the subcutaneous tissues of the overlying left lower quadrant anterior abdominal wall, potentially representing dissection through the rectus sheath or potentially reflecting overlying injection sites. Vascular/Lymphatic: There is abdominal aortic atherosclerosis without aneurysm. There is no gastrohepatic or  hepatoduodenal ligament lymphadenopathy. No intraperitoneal or retroperitoneal lymphadenopathy. No pelvic sidewall lymphadenopathy. Reproductive: The uterus has normal CT imaging appearance. There is no adnexal mass. Other: No intraperitoneal free fluid. Musculoskeletal: Bone windows reveal no worrisome lytic or sclerotic osseous lesions. Degenerative changes noted lower lumbar spine. IMPRESSION: 1. The wall thickening seen previously in the distal descending colon near the junction with the sigmoid segment has decreased in the interval although pericolonic edema/inflammation does persist and there is now a 5 x 3 x 7 cm abscess just anterior to this segment of colon, localized either in the preperitoneal fat or lower omentum. Changes most likely represent perforated diverticulitis with interval development of para colonic abscess. Tiny gas bubbles are seen in the overlying subcutaneous fat, superficial to the rectus sheath, probably related to subcutaneous injection site although dissection of gas through the rectus sheath into the subcutaneous fat cannot be entirely excluded. 2.  Abdominal Aortic Atherosclerois (ICD10-170.0) 3. Distended gallbladder with tiny calcified gallstones and slight progression of extrahepatic biliary duct dilatation. Correlation with liver function test may prove helpful. 4. Moderate hiatal hernia. Electronically Signed   By: Kennith Center M.D.   On: 04/25/2016 08:23   Ct Abdomen Pelvis W Contrast  Result Date: 04/19/2016 CLINICAL DATA:  Bilateral lower quadrant tenderness for 1 day EXAM: CT ABDOMEN AND PELVIS WITH CONTRAST TECHNIQUE: Multidetector CT imaging of the abdomen and pelvis was performed using the standard protocol following bolus administration of intravenous  contrast. CONTRAST:  ISOVUE-300 IOPAMIDOL (ISOVUE-300) INJECTION 61% COMPARISON:  May 27, 2009 FINDINGS: Lower chest: No acute abnormality. Hepatobiliary: There is diffuse low density of liver without vessel  displacement. No focal liver lesion is identified. There are gallstones within the gallbladder. The common bowel duct measures 7 mm at the pancreatic head upper limits are normal for patient age. Pancreas: Unremarkable. No pancreatic ductal dilatation or surrounding inflammatory changes. Spleen: Normal in size without focal abnormality. Adrenals/Urinary Tract: Adrenal glands are unremarkable. Kidneys are normal, without renal calculi, focal lesion, or hydronephrosis. Bladder is unremarkable. Stomach/Bowel: There are abnormal thick wall small bowel loops in the pelvis. There is 3 cm abnormal circumferential the wall of the distal descending colon. There is a 4 mm focus air which may be extraluminal adjacent to the abnormal thick wall distal descending colon. There is stranding inflammation surrounding the descending colon more prominently in the distal descending colon. There is a large hiatal hernia. Vascular/Lymphatic: Aortic atherosclerosis. No enlarged abdominal or pelvic lymph nodes. Reproductive: Uterus and bilateral adnexa are unremarkable. Other: Small amount free fluid is identified in the pelvis. Musculoskeletal: Degenerative joint changes of the spine are noted. IMPRESSION: 3 cm length of abnormal circumferential thick walled distal descending colon with adjacent focus air which may be extraluminal. Findings could be due to for colonic neoplasm with focal perforation. There are abnormal thick wall small bowel loops in the pelvis which may be inflammatory. These results will be called to the ordering clinician or representative by the Radiologist Assistant, and communication documented in the PACS or zVision Dashboard. Electronically Signed   By: Sherian Rein M.D.   On: 04/19/2016 14:13   Ir Sinus/fist Tube Chk-non Gi  Result Date: 04/30/2016 INDICATION: Diverticular abscess, status post percutaneous drainage EXAM: SINUS TRACT INJECTION/FISTULOGRAM MEDICATIONS: The patient is currently admitted to the  hospital and receiving intravenous antibiotics. The antibiotics were administered within an appropriate time frame prior to the initiation of the procedure. ANESTHESIA/SEDATION: None. COMPLICATIONS: None immediate. PROCEDURE: Informed written consent was obtained from the patient after a thorough discussion of the procedural risks, benefits and alternatives. All questions were addressed. Maximal Sterile Barrier Technique was utilized including caps, mask, sterile gowns, sterile gloves, sterile drape, hand hygiene and skin antiseptic. A timeout was performed prior to the initiation of the procedure. Under sterile conditions, the existing left lower quadrant drain was injected with contrast. Small partially collapsed residual abscess cavity present. Leakage at the skin site noted. No fistula to the adjacent colon. Images obtained for documentation. IMPRESSION: Small partially collapsed abscess at the drain catheter site. Negative for fistula to the colon. Continue JP bulb suction to evacuate the small residual fluid collection. Electronically Signed   By: Judie Petit.  Shick M.D.   On: 04/30/2016 14:40   Ct Image Guided Drainage By Percutaneous Catheter  Result Date: 04/25/2016 INDICATION: LEFT LOWER QUADRANT DIVERTICULAR ABSCESS EXAM: CT GUIDED DRAINAGE OF DIVERTICULAR ABSCESS MEDICATIONS: The patient is currently admitted to the hospital and receiving intravenous antibiotics. The antibiotics were administered within an appropriate time frame prior to the initiation of the procedure. ANESTHESIA/SEDATION: 1.0 mg IV Versed 75 mcg IV Fentanyl Moderate Sedation Time:  13 MINUTES The patient was continuously monitored during the procedure by the interventional radiology nurse under my direct supervision. COMPLICATIONS: None immediate. TECHNIQUE: Informed written consent was obtained from the patient after a thorough discussion of the procedural risks, benefits and alternatives. All questions were addressed. Maximal Sterile  Barrier Technique was utilized including caps, mask, sterile gowns, sterile  gloves, sterile drape, hand hygiene and skin antiseptic. A timeout was performed prior to the initiation of the procedure. PROCEDURE: previous imaging reviewed. patient positioned supine. noncontrast localization ct performed. the left lower quadrant anterior abdominal abscess was localized with ct. overlying skin marked. under sterile conditions and local anesthesia, an 18 gauge 10 cm introducer needle was advanced from an anterior oblique approach into the left lower quadrant abdominal abscess. needle position confirmed with ct. syringe aspiration yielded exudative fecal contaminated fluid. sample sent for gram stain and culture. guidewire inserted followed by tract dilatation to insert a 10 french drain. drain catheter position confirmed with ct. catheter secured with a prolene suture and a sterile dressing. drain catheter connected to external suction bulb. patient tolerated the procedure well. No immediate complication. FINDINGS: CT imaging confirms needle placement in the left lower quadrant anterior diverticular abscess for drain placement IMPRESSION: Successful CT-guided left lower quadrant diverticular abscess drain insertion. Electronically Signed   By: Judie Petit.  Shick M.D.   On: 04/25/2016 15:41    Delora Gravatt, MD, FACP, FHM. [facility] Pager [phone]  If 7PM-7AM, please contact night-coverage [email or web address] Password [email or web address] 04/30/2016, 7:34 PM

## 2016-04-30 NOTE — [place] FL2 (Signed)
[place] MEDICAID FL2 LEVEL OF CARE SCREENING TOOL     IDENTIFICATION  Patient Name: Tara Hicks Birthdate: 1923-04-17 Sex: female Admission Date (Current Location): 04/19/2016  [facility] and [place] Number:  Producer, television/film/video and Address:  [facility], [address] [place], [place] [address]      Provider Number: [id number]  Attending Physician Name and Address:  Elease Etienne, MD  Relative Name and Phone Number:  Jorja Loa, son, [phone]    Current Level of Care: Hospital Recommended Level of Care: Assisted Living Facility Prior Approval Number:    Date Approved/Denied:   PASRR Number:    Discharge Plan: Other (Comment) (ALF)    Current Diagnoses: Patient Active Problem List   Diagnosis Date Noted  . Diverticulitis of colon with perforation 04/24/2016  . Abdominal pain 04/19/2016  . Urinary tract infection 04/19/2016  . Sacral fracture, closed (HCC) 01/28/2016  . Dementia 01/28/2016  . Hypothyroidism 01/28/2016  . Essential hypertension 01/28/2016    Orientation RESPIRATION BLADDER Height & Weight     Self  Normal Continent Weight: 53.7 kg (118 lb 4.8 oz) Height:  5\' 4"  (162.6 cm)  BEHAVIORAL SYMPTOMS/MOOD NEUROLOGICAL BOWEL NUTRITION STATUS      Continent Diet (Soft)  AMBULATORY STATUS COMMUNICATION OF NEEDS Skin   Limited Assist Verbally Surgical wounds (Incision)                       Personal Care Assistance Level of Assistance  Bathing, Feeding, Dressing Bathing Assistance: Limited assistance Feeding assistance: Limited assistance Dressing Assistance: Limited assistance     Functional Limitations Info             SPECIAL CARE FACTORS FREQUENCY  PT (By licensed PT)     PT Frequency: Home Health PT 2x/week              Contractures      Additional Factors Info  Code Status, Allergies, Psychotropic Code Status Info: DNR Allergies Info: Phenergan Promethazine Psychotropic Info: Seroquel          Current Medications (04/30/2016):  This is the current hospital active medication list Current Facility-Administered Medications  Medication Dose Route Frequency Provider Last Rate Last Dose  . acetaminophen (TYLENOL) tablet 650 mg  650 mg Oral Q6H PRN Kerrin Mo, MD   650 mg at 04/27/16 1246   Or  . acetaminophen (TYLENOL) suppository 650 mg  650 mg Rectal Q6H PRN Kerrin Mo, MD      . amLODipine (NORVASC) tablet 5 mg  5 mg Oral Daily Rhetta Mura, MD   5 mg at 04/30/16 0856  . amoxicillin-clavulanate (AUGMENTIN) 500-125 MG per tablet 500 mg  1 tablet Oral Q12H Elease Etienne, MD      . clopidogrel (PLAVIX) tablet 75 mg  75 mg Oral Daily Rhetta Mura, MD   75 mg at 04/30/16 0857  . donepezil (ARICEPT) tablet 10 mg  10 mg Oral QHS Nishant Dhungel, MD   10 mg at 04/29/16 2239  . fentaNYL (SUBLIMAZE) injection   Intravenous PRN Berdine Dance, MD   50 mcg at 04/25/16 1457  . haloperidol lactate (HALDOL) injection 1 mg  1 mg Intravenous Q6H PRN Nishant Dhungel, MD   1 mg at 04/28/16 0804  . heparin injection 5,000 Units  5,000 Units Subcutaneous Q8H Ralene Muskrat, PA-C   5,000 Units at 04/30/16 0741  . hydrALAZINE (APRESOLINE) tablet 10 mg  10 mg Oral Q8H PRN Kerrin Mo, MD  10 mg at 04/23/16 2339  . levothyroxine (SYNTHROID, LEVOTHROID) tablet 50 mcg  50 mcg Oral Once per day on Mon Wed Fri Nishant Dhungel, MD   50 mcg at 04/30/16 0856  . midazolam (VERSED) injection   Intravenous PRN Berdine DanceMichael Shick, MD   1 mg at 04/25/16 1456  . morphine 2 MG/ML injection 2 mg  2 mg Intravenous Q4H PRN Kerrin Moobin Edwin, MD   2 mg at 04/26/16 [id number]  . ondansetron (ZOFRAN) injection 4 mg  4 mg Intravenous Q6H Rhetta MuraJai-Gurmukh Samtani, MD   4 mg at 04/30/16 0337  . potassium chloride SA (K-DUR,KLOR-CON) CR tablet 40 mEq  40 mEq Oral Daily Rhetta MuraJai-Gurmukh Samtani, MD   40 mEq at 04/30/16 0856  . QUEtiapine (SEROQUEL) tablet 12.5 mg  12.5 mg Oral QHS Nishant Dhungel, MD   12.5 mg at 04/29/16 2238      Discharge Medications: Please see discharge summary for a list of discharge medications.  Relevant Imaging Results:  Relevant Lab Results:   Additional Information SSN: [id number]  Armiyah Capron S [place], [place]

## 2016-04-30 NOTE — Progress Notes (Signed)
Pt's JP drain will remain in 3-5 more days per IR. MD made aware. Will continue to monitor.

## 2016-04-30 NOTE — Progress Notes (Signed)
Patient ID: Tara Hicks, female   DOB: March 12, 1923, 83 y.o.   MRN: [id number]  [facility]     Subjective: Tara Hicks is very sleepy this morning. States that she slept well last night. Denies any abdominal pain. States that she is hungry.  Objective: Vital signs in last 24 hours: Temp:  [97.5 F (36.4 C)-98.2 F (36.8 C)] 98.2 F (36.8 C) (01/10 0504) Pulse Rate:  [58-64] 58 (01/10 0504) Resp:  [17-20] 18 (01/10 0504) BP: (127-156)/(62-106) 127/65 (01/10 0504) SpO2:  [96 %-98 %] 97 % (01/10 0504) Last BM Date: 04/28/16  Intake/Output from previous day: 01/09 0701 - 01/10 0700 In: 363 [P.O.:358] Out: 30 [Urine:30] Intake/Output this shift: No intake/output data recorded.  PE: Gen:  Alert, NAD, pleasant Card:  RRR Pulm:  CTAB, no W/R/R, effort normal Abd: Soft, NT/ND, +BS, no HSM,drain site clean, drain with minimal yellow/slightly cloudy drainage  Lab Results:   Recent Labs  04/27/16 0913 04/28/16 0808  WBC 10.1 9.1  HGB 10.8* 11.9*  HCT 33.8* 35.3*  PLT 255 323   BMET  Recent Labs  04/28/16 0808  NA 137  K 4.0  CL 102  CO2 21*  GLUCOSE 92  BUN 12  CREATININE 1.03*  CALCIUM 9.4   PT/INR No results for input(s): LABPROT, INR in the last 72 hours. CMP     Component Value Date/Time   NA 137 04/28/2016 0808   K 4.0 04/28/2016 0808   CL 102 04/28/2016 0808   CO2 21 (L) 04/28/2016 0808   GLUCOSE 92 04/28/2016 0808   BUN 12 04/28/2016 0808   CREATININE 1.03 (H) 04/28/2016 0808   CALCIUM 9.4 04/28/2016 0808   PROT 5.8 (L) 04/20/2016 0824   ALBUMIN 3.0 (L) 04/20/2016 0824   AST 18 04/20/2016 0824   ALT 12 (L) 04/20/2016 0824   ALKPHOS 62 04/20/2016 0824   BILITOT 0.9 04/20/2016 0824   GFRNONAA 46 (L) 04/28/2016 0808   GFRAA 53 (L) 04/28/2016 0808   Lipase     Component Value Date/Time   LIPASE 10 (L) 04/19/2016 1235       Studies/Results: Ct Abdomen Pelvis W Contrast  Result Date: 04/29/2016 CLINICAL DATA:   Status post drainage of peritoneal abscess. EXAM: CT ABDOMEN AND PELVIS WITH CONTRAST TECHNIQUE: Multidetector CT imaging of the abdomen and pelvis was performed using the standard protocol following bolus administration of intravenous contrast. CONTRAST:  100mL ISOVUE-300 IOPAMIDOL (ISOVUE-300) INJECTION 61% COMPARISON:  CT scan of April 25, 2016. FINDINGS: Lower chest: No acute abnormality.  Stable hiatal hernia. Hepatobiliary: Cholelithiasis is noted. No focal abnormality is noted in the liver. Pancreas: Unremarkable. No pancreatic ductal dilatation or surrounding inflammatory changes. Spleen: Normal in size without focal abnormality. Adrenals/Urinary Tract: Adrenal glands are unremarkable. Kidneys are normal, without renal calculi, focal lesion, or hydronephrosis. Bladder is unremarkable. Stomach/Bowel: Diverticulosis of descending and sigmoid colon is noted without inflammation. There is no evidence of bowel obstruction. The appendix appears normal. Vascular/Lymphatic: Aortic atherosclerosis. No enlarged abdominal or pelvic lymph nodes. Reproductive: Uterus and bilateral adnexa are unremarkable. Other: Drainage catheter is seen with tip anteriorly in the left lower quadrant of the abdomen. No significant amount of fluid remains around the catheter tip. Musculoskeletal: Multilevel degenerative disc disease is noted in the lumbar spine. IMPRESSION: Cholelithiasis. Aortic atherosclerosis. Diverticulosis of descending and sigmoid colon without inflammation. Status post placement of drainage catheter into fluid collection in left lower quadrant of abdomen. No significant amount of fluid is noted around the  catheter tip. Electronically Signed   By: Lupita Raider, M.D.   On: 04/29/2016 10:03    Anti-infectives: Anti-infectives    Start     Dose/Rate Route Frequency Ordered Stop   04/29/16 2200  amoxicillin-clavulanate (AUGMENTIN) 875-125 MG per tablet 1 tablet     1 tablet Oral Every 12 hours 04/29/16 1822      04/20/16 0830  piperacillin-tazobactam (ZOSYN) IVPB 3.375 g  Status:  Discontinued     3.375 g 12.5 mL/hr over 240 Minutes Intravenous Every 8 hours 04/20/16 0825 04/29/16 1822   04/20/16 0230  ciprofloxacin (CIPRO) IVPB 400 mg  Status:  Discontinued     400 mg 200 mL/hr over 60 Minutes Intravenous Every 12 hours 04/19/16 1938 04/20/16 0812   04/20/16 0000  metroNIDAZOLE (FLAGYL) IVPB 500 mg  Status:  Discontinued     500 mg 100 mL/hr over 60 Minutes Intravenous Every 8 hours 04/19/16 1938 04/20/16 0812   04/19/16 1430  metroNIDAZOLE (FLAGYL) IVPB 500 mg     500 mg 100 mL/hr over 60 Minutes Intravenous  Once 04/19/16 1428 04/19/16 1543   04/19/16 1430  ciprofloxacin (CIPRO) tablet 500 mg     500 mg Oral  Once 04/19/16 1428 04/19/16 1434       Assessment/Plan Acute colitis/diverticulitis with microperforation-no evidence of sepsis - CT guided drain placed 1/5 - WBC WNL 1/8 and drain output decreasing - CT scan 1/9 showed abscess resolved - Per IR drain injection prior to drain removal.  Dementia Hypothyroidism Essential hypertension Urinary tract infection  ID:  Cipro 12/30>>12/31  Flagyl 12/30>>12/31 Zosyn 12/31>> FEN: soft diet VTE: SCDs, Heparin, Plavix  Plan: Please have IR remove drain. May apply dry dressing daily as needed to drain site. Advance to soft diet. Continue antibiotics.   LOS: 11 days    Edson Snowball , [facility] Surgery 04/30/2016, 8:47 AM Pager: [phone] Consults: [phone] Mon-Fri 7:00 am-4:30 pm Sat-Sun 7:00 am-11:30 am

## 2016-05-01 DIAGNOSIS — N390 Urinary tract infection, site not specified: Secondary | ICD-10-CM

## 2016-05-01 DIAGNOSIS — F0280 Dementia in other diseases classified elsewhere without behavioral disturbance: Secondary | ICD-10-CM

## 2016-05-01 DIAGNOSIS — G308 Other Alzheimer's disease: Secondary | ICD-10-CM

## 2016-05-01 DIAGNOSIS — E039 Hypothyroidism, unspecified: Secondary | ICD-10-CM

## 2016-05-01 MED ORDER — CEFDINIR 300 MG PO CAPS: 300.0000 mg | ORAL_CAPSULE | Freq: Two times a day (BID) | ORAL | Status: AC

## 2016-05-01 MED ORDER — AMLODIPINE BESYLATE 5 MG PO TABS: 5.0000 mg | ORAL_TABLET | Freq: Every day | ORAL | Status: AC

## 2016-05-01 MED ORDER — ACETAMINOPHEN 325 MG PO TABS: 650.0000 mg | ORAL_TABLET | Freq: Four times a day (QID) | ORAL | Status: AC | PRN

## 2016-05-01 MED ORDER — QUETIAPINE FUMARATE 25 MG PO TABS: 12.5000 mg | ORAL_TABLET | Freq: Every day | ORAL | Status: DC

## 2016-05-01 NOTE — Progress Notes (Signed)
Patient with drain still in place as she had a residual abscess cavity noted on drain injection.  An order has been sent to our office for them to contact the patient to set up a drain clinic appointment in 1-2 weeks for a repeat CT scan with possible drain injection.  She will need her drain flushed with 5cc of normal saline daily.  She is stable from our standpoint for DC.  Tara Hicks E 8:12 AM 05/01/2016

## 2016-05-01 NOTE — Clinical Social Work Placement (Signed)
   CLINICAL SOCIAL WORK PLACEMENT  NOTE  Date:  05/01/2016  Patient Details  Name: Tara Hicks MRN: [id number] Date of Birth: [birth year]  Clinical Social Work is seeking post-discharge placement for this patient at the Skilled  Nursing Facility level of care (*CSW will initial, date and re-position this form in  chart as items are completed):  Yes   Patient/family provided with [facility] Clinical Social Work Department's list of facilities offering this level of care within the geographic area requested by the patient (or if unable, by the patient's family).  Yes   Patient/family informed of their freedom to choose among providers that offer the needed level of care, that participate in Medicare, Medicaid or managed care program needed by the patient, have an available bed and are willing to accept the patient.  Yes   Patient/family informed of [facility]'s ownership interest [facility] and [facility], as well as of the fact that they are under no obligation to receive care at these facilities.  PASRR submitted to [employer] on 05/01/16     PASRR number received on 05/01/16     Existing PASRR number confirmed on       FL2 transmitted to all facilities in geographic area requested by pt/family on 05/01/16     FL2 transmitted to all facilities within larger geographic area on       Patient informed that his/her managed care company has contracts with or will negotiate with certain facilities, including the following:        Yes   Patient/family informed of bed offers received.  [facility]     Physician recommends and patient chooses bed at      [name].  Patient to be transferred to facility by [facility]     Patient family notified on 05/01/16 of transfer.  Name of family member notified:  [name], son     PHYSICIAN Please sign FL2     Additional Comment:     _______________________________________________ Mearl LatinNadia S Spyros Winch, LCSWA 05/01/2016, 1:54 PM

## 2016-05-01 NOTE — Discharge Summary (Signed)
Physician Discharge Summary  Tara Hicks NWG:[id number] DOB: 12-04-1922  PCP: Florentina Jenny, MD  Admit date: 04/19/2016 Discharge date: 05/01/2016  Recommendations for Outpatient Follow-up:  1. M.D. at SNF in 4 days with repeat labs (CBC & CMP).  2. IR Radiologist Eval & Management: Follow-up in drain clinic in 1-2 weeks with CT abdomen/pelvis and possible drain injection at [facility]. SNF to coordinate. 3. Dr. Almond Lint, General Surgery on 05/26/16 at 2:30 PM. 4. Dr. Florentina Jenny, PCP upon discharge from SNF.  Home Health: None Equipment/Devices: Patient will be discharged with the abdominal drain and management as per instructions provided.    Discharge Condition: Improved and stable  CODE STATUS: DO NOT RESUSCITATE  Diet recommendation: Soft diet  Discharge Diagnoses:  Active Problems:   Dementia   Hypothyroidism   Essential hypertension   Abdominal pain   Urinary tract infection   Diverticulitis of colon with perforation   Brief/Interim Summary: 83 year old female, resident of assisted living facility, PMH of dementia, GERD, HTN, hypothyroid, TIA presented to [facility] via [facility] with complaints of abdominal pain. CT abdomen confirmed abnormal distal descending colon with perforation. She was admitted for further evaluation and management. General surgery was consulted.  Assessment and plan:  1. Acute Colitis/Sigmoid diverticulitis with microperforation and left lower quadrant diverticular abscess: Gen. surgery was consulted and they favored diverticulitis more than malignancy at this point as there was no specific mass or adenopathy although this cannot be entirely excluded. She did not need acute surgical intervention. She was treated conservatively with bowel rest, IV antibiotics. She received several different antibiotics as outlined: IV Cipro and metronidazole on 12/30, IV Zosyn 04/21/16-04/29/16, Augmentin 04/29/16-04/30/16, IV Rocephin  1/10. As per report by family, patient had colonoscopy in the last 5 years which was unremarkable. Diet was gradually advanced. CT abdomen was repeated and showed small left lower quadrant abscess. IR was consulted and placed percutaneous drain into the abscess cavity. With these measures including bowel rest, IV antibiotics and percutaneous JP drain, patient continued to make gradual improvement. This is almost no drainage from the JP drain. Drain was injected and CT repeated. IR has evaluated and indicated that she has a residual abscess cavity and recommend discharging patient with the JP drain in place and radiology will arrange for outpatient follow-up with a repeat CT scan with possible drain injection and make a decision regarding removing the drain. Both interventional radiology and general surgery has cleared her for discharge and have arranged for outpatient follow-up. General surgery recommends additional 1 week of antibiotics. Reviewed abscess culture results which shows Escherichia coli resistant to ampicillin, ampicillin/sulbactam, Cipro and Bactrim. Discussed with pharmacy and infectious disease and we'll discharge patient on cefdinir/Omnicef to complete an additional 1 week treatment. After her acute phase has settled, may consider GI consultation for colonoscopy. 2. Essential hypertension: Started on amlodipine and continue same at discharge. Controlled. 3. Prior TIA: Plavix was temporarily held and has been resumed. 4. Moderate dementia: She did get some medications for occasional agitation which has since resolved. Continue Aricept and other medications at discharge. 5. Hypothyroid: Continue home dose of Synthroid. TSH 3.113. 6. Hypokalemia: Replaced. 7. Acute kidney injury: Suspected secondary to dehydration. Resolved with IV fluids.  8. Presumed UTI: Urine culture after antibiotics were started was negative. Completed treatment. 9. Anemia: Stable   Discharge Instructions  Discharge  Instructions    Call MD for:  difficulty breathing, headache or visual disturbances    Complete by:  As directed    Call MD for:  extreme fatigue    Complete by:  As directed    Call MD for:  persistant dizziness or light-headedness    Complete by:  As directed    Call MD for:  persistant nausea and vomiting    Complete by:  As directed    Call MD for:  severe uncontrolled pain    Complete by:  As directed    Call MD for:  temperature >100.4    Complete by:  As directed    Discharge instructions    Complete by:  As directed    Diet: Soft diet.   Increase activity slowly    Complete by:  As directed        Medication List    STOP taking these medications   HYDROcodone-acetaminophen 5-325 MG tablet Commonly known as:  NORCO/VICODIN     TAKE these medications   acetaminophen 325 MG tablet Commonly known as:  TYLENOL Take 2 tablets (650 mg total) by mouth every 6 (six) hours as needed for mild pain, moderate pain, fever or headache (or Fever >/= 101). What changed:  reasons to take this   amLODipine 5 MG tablet Commonly known as:  NORVASC Take 1 tablet (5 mg total) by mouth daily.   cefdinir 300 MG capsule Commonly known as:  OMNICEF Take 1 capsule (300 mg total) by mouth 2 (two) times daily. Discontinue after 05/07/16 doses.   clopidogrel 75 MG tablet Commonly known as:  PLAVIX Take 75 mg by mouth daily.   donepezil 10 MG tablet Commonly known as:  ARICEPT Take 10 mg by mouth at bedtime.   levothyroxine 50 MCG tablet Commonly known as:  SYNTHROID, LEVOTHROID Take 50 mcg by mouth every Monday, Wednesday, and Friday.   Melatonin 5 MG Tabs Take 5 mg by mouth at bedtime.   MILK OF MAGNESIA PO Take 30 mLs by mouth daily as needed (constipation).   ondansetron 4 MG tablet Commonly known as:  ZOFRAN Take 4 mg by mouth every 4 (four) hours as needed for nausea.   QUEtiapine 25 MG tablet Commonly known as:  SEROQUEL Take 0.5 tablets (12.5 mg total) by mouth at  bedtime. What changed:  how much to take   vitamin B-12 500 MCG tablet Commonly known as:  CYANOCOBALAMIN Take 500 mcg by mouth daily.      Follow-up Information    BYERLY,FAERA, MD. Go on 05/26/2016.   Specialty:  General Surgery Why:  Your appointment is 05/26/16 at 2:30pm. Please arrive 30 minutes prior to your appointment to check in and fill out necessary paperwork. Contact information: [address] [place] [address] [phone]        M.D. at SNF. Schedule an appointment as soon as possible for a visit in 4 day(s).   Why:  To be seen with repeat labs (CBC & CMP). Interventional radiology will arrange for follow-up with repeat CT abdomen and management of abdominal drain.       Florentina Jenny, MD. Schedule an appointment as soon as possible for a visit.   Specialty:  Family Medicine Why:  Upon discharge from SNF. Contact information: [address] [place] [address] [phone]          Allergies  Allergen Reactions  . Phenergan [Promethazine] Other (See Comments)    hyperactive    Consultations:  General surgery  Interventional radiology   Procedures/Studies: Ct Abdomen Pelvis W Contrast  Result  Date: 04/29/2016 CLINICAL DATA:  Status post drainage of peritoneal abscess. EXAM: CT ABDOMEN AND PELVIS WITH CONTRAST TECHNIQUE: Multidetector CT imaging of the abdomen and pelvis was performed using the standard protocol following bolus administration of intravenous contrast. CONTRAST:  ISOVUE-300 IOPAMIDOL (ISOVUE-300) INJECTION 61% COMPARISON:  CT scan of April 25, 2016. FINDINGS: Lower chest: No acute abnormality.  Stable hiatal hernia. Hepatobiliary: Cholelithiasis is noted. No focal abnormality is noted in the liver. Pancreas: Unremarkable. No pancreatic ductal dilatation or surrounding inflammatory changes. Spleen: Normal in size without focal abnormality. Adrenals/Urinary Tract: Adrenal glands are unremarkable. Kidneys  are normal, without renal calculi, focal lesion, or hydronephrosis. Bladder is unremarkable. Stomach/Bowel: Diverticulosis of descending and sigmoid colon is noted without inflammation. There is no evidence of bowel obstruction. The appendix appears normal. Vascular/Lymphatic: Aortic atherosclerosis. No enlarged abdominal or pelvic lymph nodes. Reproductive: Uterus and bilateral adnexa are unremarkable. Other: Drainage catheter is seen with tip anteriorly in the left lower quadrant of the abdomen. No significant amount of fluid remains around the catheter tip. Musculoskeletal: Multilevel degenerative disc disease is noted in the lumbar spine. IMPRESSION: Cholelithiasis. Aortic atherosclerosis. Diverticulosis of descending and sigmoid colon without inflammation. Status post placement of drainage catheter into fluid collection in left lower quadrant of abdomen. No significant amount of fluid is noted around the catheter tip. Electronically Signed   By: Lupita Raider, M.D.   On: 04/29/2016 10:03   Ct Abdomen Pelvis W Contrast  Result Date: 04/25/2016 CLINICAL DATA:  Left lower quadrant pain for awhile. EXAM: CT ABDOMEN AND PELVIS WITH CONTRAST TECHNIQUE: Multidetector CT imaging of the abdomen and pelvis was performed using the standard protocol following bolus administration of intravenous contrast. CONTRAST:  75mL ISOVUE-300 IOPAMIDOL (ISOVUE-300) INJECTION 61% COMPARISON:  04/19/2016 FINDINGS: Lower chest:  Unremarkable. Hepatobiliary: No focal abnormality within the liver parenchyma. Gallbladder is distended with several gallstones identified measuring about 5 mm each. No evidence gallbladder wall thickening or pericholecystic fluid. Extrahepatic common bile duct remains mildly distended at 10 mm. No intrahepatic biliary duct dilatation. Pancreas: Pancreas is diffusely atrophic. Spleen: No splenomegaly. No focal mass lesion. Adrenals/Urinary Tract: No adrenal nodule or mass. Kidneys are unremarkable. No  evidence for hydroureter. The urinary bladder appears normal for the degree of distention. Stomach/Bowel: Moderate to large hiatal hernia is evident. Duodenal diverticulum noted in the region of the ampulla. No small bowel wall thickening. No small bowel dilatation. The terminal ileum is normal. The appendix is normal. Diverticular changes are seen scattered along the length of the colon. In the anterior left lower quadrant, in the region of the distal descending/ proximal sigmoid colon, a 4.7 x 3.0 x 6.5 cm collection of gas and fluid is identified just deep to the rectus musculature. This is probably in the preperitoneal space for contained within the inferior omentum. Adjacent tiny contained loculated sub of free air are associated and there is some gas in the subcutaneous tissues of the overlying left lower quadrant anterior abdominal wall, potentially representing dissection through the rectus sheath or potentially reflecting overlying injection sites. Vascular/Lymphatic: There is abdominal aortic atherosclerosis without aneurysm. There is no gastrohepatic or hepatoduodenal ligament lymphadenopathy. No intraperitoneal or retroperitoneal lymphadenopathy. No pelvic sidewall lymphadenopathy. Reproductive: The uterus has normal CT imaging appearance. There is no adnexal mass. Other: No intraperitoneal free fluid. Musculoskeletal: Bone windows reveal no worrisome lytic or sclerotic osseous lesions. Degenerative changes noted lower lumbar spine. IMPRESSION: 1. The wall thickening seen previously in the distal descending colon near the junction with the  sigmoid segment has decreased in the interval although pericolonic edema/inflammation does persist and there is now a 5 x 3 x 7 cm abscess just anterior to this segment of colon, localized either in the preperitoneal fat or lower omentum. Changes most likely represent perforated diverticulitis with interval development of para colonic abscess. Tiny gas bubbles are seen  in the overlying subcutaneous fat, superficial to the rectus sheath, probably related to subcutaneous injection site although dissection of gas through the rectus sheath into the subcutaneous fat cannot be entirely excluded. 2.  Abdominal Aortic Atherosclerois (ICD10-170.0) 3. Distended gallbladder with tiny calcified gallstones and slight progression of extrahepatic biliary duct dilatation. Correlation with liver function test may prove helpful. 4. Moderate hiatal hernia. Electronically Signed   By: Kennith Center M.D.   On: 04/25/2016 08:23   Ct Abdomen Pelvis W Contrast  Result Date: 04/19/2016 CLINICAL DATA:  Bilateral lower quadrant tenderness for 1 day EXAM: CT ABDOMEN AND PELVIS WITH CONTRAST TECHNIQUE: Multidetector CT imaging of the abdomen and pelvis was performed using the standard protocol following bolus administration of intravenous contrast. CONTRAST:  ISOVUE-300 IOPAMIDOL (ISOVUE-300) INJECTION 61% COMPARISON:  May 27, 2009 FINDINGS: Lower chest: No acute abnormality. Hepatobiliary: There is diffuse low density of liver without vessel displacement. No focal liver lesion is identified. There are gallstones within the gallbladder. The common bowel duct measures 7 mm at the pancreatic head upper limits are normal for patient age. Pancreas: Unremarkable. No pancreatic ductal dilatation or surrounding inflammatory changes. Spleen: Normal in size without focal abnormality. Adrenals/Urinary Tract: Adrenal glands are unremarkable. Kidneys are normal, without renal calculi, focal lesion, or hydronephrosis. Bladder is unremarkable. Stomach/Bowel: There are abnormal thick wall small bowel loops in the pelvis. There is 3 cm abnormal circumferential the wall of the distal descending colon. There is a 4 mm focus air which may be extraluminal adjacent to the abnormal thick wall distal descending colon. There is stranding inflammation surrounding the descending colon more prominently in the distal  descending colon. There is a large hiatal hernia. Vascular/Lymphatic: Aortic atherosclerosis. No enlarged abdominal or pelvic lymph nodes. Reproductive: Uterus and bilateral adnexa are unremarkable. Other: Small amount free fluid is identified in the pelvis. Musculoskeletal: Degenerative joint changes of the spine are noted. IMPRESSION: 3 cm length of abnormal circumferential thick walled distal descending colon with adjacent focus air which may be extraluminal. Findings could be due to for colonic neoplasm with focal perforation. There are abnormal thick wall small bowel loops in the pelvis which may be inflammatory. These results will be called to the ordering clinician or representative by the Radiologist Assistant, and communication documented in the PACS or zVision Dashboard. Electronically Signed   By: Sherian Rein M.D.   On: 04/19/2016 14:13   Ir Sinus/fist Tube Chk-non Gi  Result Date: 04/30/2016 INDICATION: Diverticular abscess, status post percutaneous drainage EXAM: SINUS TRACT INJECTION/FISTULOGRAM MEDICATIONS: The patient is currently admitted to the hospital and receiving intravenous antibiotics. The antibiotics were administered within an appropriate time frame prior to the initiation of the procedure. ANESTHESIA/SEDATION: None. COMPLICATIONS: None immediate. PROCEDURE: Informed written consent was obtained from the patient after a thorough discussion of the procedural risks, benefits and alternatives. All questions were addressed. Maximal Sterile Barrier Technique was utilized including caps, mask, sterile gowns, sterile gloves, sterile drape, hand hygiene and skin antiseptic. A timeout was performed prior to the initiation of the procedure. Under sterile conditions, the existing left lower quadrant drain was injected with contrast. Small partially collapsed residual abscess cavity  present. Leakage at the skin site noted. No fistula to the adjacent colon. Images obtained for documentation.  IMPRESSION: Small partially collapsed abscess at the drain catheter site. Negative for fistula to the colon. Continue JP bulb suction to evacuate the small residual fluid collection. Electronically Signed   By: Judie Petit.  Shick M.D.   On: 04/30/2016 14:40   Ct Image Guided Drainage By Percutaneous Catheter  Result Date: 04/25/2016 INDICATION: LEFT LOWER QUADRANT DIVERTICULAR ABSCESS EXAM: CT GUIDED DRAINAGE OF DIVERTICULAR ABSCESS MEDICATIONS: The patient is currently admitted to the hospital and receiving intravenous antibiotics. The antibiotics were administered within an appropriate time frame prior to the initiation of the procedure. ANESTHESIA/SEDATION: 1.0 mg IV Versed 75 mcg IV Fentanyl Moderate Sedation Time:  13 MINUTES The patient was continuously monitored during the procedure by the interventional radiology nurse under my direct supervision. COMPLICATIONS: None immediate. TECHNIQUE: Informed written consent was obtained from the patient after a thorough discussion of the procedural risks, benefits and alternatives. All questions were addressed. Maximal Sterile Barrier Technique was utilized including caps, mask, sterile gowns, sterile gloves, sterile drape, hand hygiene and skin antiseptic. A timeout was performed prior to the initiation of the procedure. PROCEDURE: previous imaging reviewed. patient positioned supine. noncontrast localization ct performed. the left lower quadrant anterior abdominal abscess was localized with ct. overlying skin marked. under sterile conditions and local anesthesia, an 18 gauge 10 cm introducer needle was advanced from an anterior oblique approach into the left lower quadrant abdominal abscess. needle position confirmed with ct. syringe aspiration yielded exudative fecal contaminated fluid. sample sent for gram stain and culture. guidewire inserted followed by tract dilatation to insert a 10 french drain. drain catheter position confirmed with ct. catheter secured with a  prolene suture and a sterile dressing. drain catheter connected to external suction bulb. patient tolerated the procedure well. No immediate complication. FINDINGS: CT imaging confirms needle placement in the left lower quadrant anterior diverticular abscess for drain placement IMPRESSION: Successful CT-guided left lower quadrant diverticular abscess drain insertion. Electronically Signed   By: Judie Petit.  Shick M.D.   On: 04/25/2016 15:41      Subjective: Patient complains of intermittent mild left lower quadrant abdominal discomfort but no worse than yesterday. Tolerating diet. Passing flatus. Denies any other complaints. As per RN, no acute issues.  Discharge Exam:  Vitals:   04/30/16 0504 04/30/16 0856 04/30/16 2157 05/01/16 0602  BP: 127/65 (!) 127/59 (!) 142/60 (!) 138/54  Pulse: (!) 58  70 84  Resp: 18  18 17   Temp: 98.2 F (36.8 C)  98.6 F (37 C) 99.7 F (37.6 C)  TempSrc: Oral  Oral Oral  SpO2: 97%  98% 96%  Weight:      Height:        General: Pt lying comfortably in bed & appears in no obvious distress. Cardiovascular: S1 & S2 heard, RRR, S1/S2 +. No murmurs, rubs, gallops or clicks. No JVD or pedal edema. Respiratory: Clear to auscultation without wheezing, rhonchi or crackles. No increased work of breathing. Abdominal:  Non distended, & soft. Minimal tenderness in the left lower quadrant without rigidity, guarding or rebound. Left lower quadrant JP drain with no fluid in the bulb. No organomegaly or masses appreciated. Normal bowel sounds heard. CNS: Alert and oriented. No focal deficits. Extremities: no edema, no cyanosis    The results of significant diagnostics from this hospitalization (including imaging, microbiology, ancillary and laboratory) are listed below for reference.     Microbiology: Recent Results (from the  past 240 hour(s))  Culture, Urine     Status: None   Collection Time: 04/21/16  4:22 PM  Result Value Ref Range Status   Specimen Description URINE,  CATHETERIZED  Final   Special Requests NONE  Final   Culture NO GROWTH  Final   Report Status 04/22/2016 FINAL  Final  Aerobic/Anaerobic Culture (surgical/deep wound)     Status: None   Collection Time: 04/25/16  4:27 PM  Result Value Ref Range Status   Specimen Description ABSCESS  Final   Special Requests LLQ diverticular abscess drain  Final   Gram Stain   Final    NO WBC SEEN FEW GRAM NEGATIVE RODS RARE GRAM POSITIVE RODS RARE GRAM POSITIVE COCCI IN PAIRS    Culture   Final    FEW ESCHERICHIA COLI MODERATE CANDIDA ALBICANS NO ANAEROBES ISOLATED    Report Status 04/29/2016 FINAL  Final   Organism ID, Bacteria ESCHERICHIA COLI  Final      Susceptibility   Escherichia coli - MIC*    AMPICILLIN >=32 RESISTANT Resistant     CEFAZOLIN <=4 SENSITIVE Sensitive     CEFEPIME <=1 SENSITIVE Sensitive     CEFTAZIDIME <=1 SENSITIVE Sensitive     CEFTRIAXONE <=1 SENSITIVE Sensitive     CIPROFLOXACIN >=4 RESISTANT Resistant     GENTAMICIN <=1 SENSITIVE Sensitive     IMIPENEM <=0.25 SENSITIVE Sensitive     TRIMETH/SULFA >=320 RESISTANT Resistant     AMPICILLIN/SULBACTAM 16 INTERMEDIATE Intermediate     PIP/TAZO <=4 SENSITIVE Sensitive     Extended ESBL NEGATIVE Sensitive     * FEW ESCHERICHIA COLI  C difficile quick scan w PCR reflex     Status: None   Collection Time: 04/28/16 11:50 AM  Result Value Ref Range Status   C Diff antigen NEGATIVE NEGATIVE Final   C Diff toxin NEGATIVE NEGATIVE Final   C Diff interpretation No C. difficile detected.  Final     Labs: BNP (last 3 results) No results for input(s): BNP in the last 8760 hours. Basic Metabolic Panel:  Recent Labs Lab 04/25/16 0634 04/26/16 0822 04/28/16 0808  NA 136 138 137  K 3.2* 3.5 4.0  CL 100* 104 102  CO2 24 25 21*  GLUCOSE 111* 87 92  BUN 11 11 12   CREATININE 0.96 0.87 1.03*  CALCIUM 9.3 9.0 9.4   CBC:  Recent Labs Lab 04/25/16 0634 04/26/16 0822 04/27/16 0913 04/28/16 0808  WBC 11.6* 10.3 10.1  9.1  NEUTROABS  --   --  7.8*  --   HGB 12.3 11.0* 10.8* 11.9*  HCT 38.0 34.4* 33.8* 35.3*  MCV 80.2 81.7 82.4 81.1  PLT 306 277 255 323    Urinalysis    Component Value Date/Time   COLORURINE YELLOW 04/19/2016 1259   APPEARANCEUR CLOUDY (A) 04/19/2016 1259   LABSPEC 1.020 04/19/2016 1259   PHURINE 7.5 04/19/2016 1259   GLUCOSEU NEGATIVE 04/19/2016 1259   HGBUR SMALL (A) 04/19/2016 1259   BILIRUBINUR NEGATIVE 04/19/2016 1259   KETONESUR NEGATIVE 04/19/2016 1259   PROTEINUR NEGATIVE 04/19/2016 1259   UROBILINOGEN 1.0 07/08/2014 1132   NITRITE POSITIVE (A) 04/19/2016 1259   LEUKOCYTESUR MODERATE (A) 04/19/2016 1259     Time coordinating discharge: Over 30 minutes  SIGNED:  Marcellus ScottHONGALGI,Azadeh Hyder, MD, FACP, FHM. [facility] Pager [phone]  If 7PM-7AM, please contact night-coverage [email or web address] Password [email or web address] 05/01/2016, 12:49 PM

## 2016-05-01 NOTE — Progress Notes (Signed)
[name] D/C'd SNF  per MD order.  Discussed with the patient and all questions fully answered.  VSS, Skin clean, dry and intact without evidence of skin break down, no evidence of skin tears noted. IV catheter discontinued intact. Site without signs and symptoms of complications. Dressing and pressure applied.  An After Visit Summary was printed by Child psychotherapistsocial worker and given to [name].  D/c education completed with patient/family including follow up instructions, packet, medication list, d/c activities limitations if indicated, with other d/c instructions as indicated by MD was also given to [name]  Patient instructed to return to ED, call 911, or call MD for any changes in condition.   Pt was D/C to SNF via ambulance.  [name] O [name] 05/01/2016 4:48 PM

## 2016-05-01 NOTE — [place] FL2 (Signed)
[place] MEDICAID FL2 LEVEL OF CARE SCREENING TOOL     IDENTIFICATION  Patient Name: Tara Hicks Birthdate: 01-12-1923 Sex: female Admission Date (Current Location): 04/19/2016  [facility] and [place] Number:  Producer, television/film/video and Address:  [facility], [address] [place], [place] [address]      Provider Number: [id number]  Attending Physician Name and Address:  Elease Etienne, MD  Relative Name and Phone Number:  Jorja Loa, son, [phone]    Current Level of Care: SNF Recommended Level of Care: Skilled Nursing Facility Prior Approval Number:    Date Approved/Denied:   PASRR Number: [id number]  Discharge Plan: Other (Comment) (ALF)    Current Diagnoses: Patient Active Problem List   Diagnosis Date Noted  . Diverticulitis of colon with perforation 04/24/2016  . Abdominal pain 04/19/2016  . Urinary tract infection 04/19/2016  . Sacral fracture, closed (HCC) 01/28/2016  . Dementia 01/28/2016  . Hypothyroidism 01/28/2016  . Essential hypertension 01/28/2016    Orientation RESPIRATION BLADDER Height & Weight     Self  Normal Continent Weight: 53.7 kg (118 lb 4.8 oz) Height:  5\' 4"  (162.6 cm)  BEHAVIORAL SYMPTOMS/MOOD NEUROLOGICAL BOWEL NUTRITION STATUS      Continent Diet (Liquids)  AMBULATORY STATUS COMMUNICATION OF NEEDS Skin   Limited Assist Verbally  (JP drain)                       Personal Care Assistance Level of Assistance  Bathing, Feeding, Dressing Bathing Assistance: Limited assistance Feeding assistance: Limited assistance Dressing Assistance: Limited assistance     Functional Limitations Info             SPECIAL CARE FACTORS FREQUENCY   (JP drain in abdomen)     PT Frequency: Home Health PT 2x/week              Contractures      Additional Factors Info  Code Status, Allergies, Psychotropic Code Status Info: DNR Allergies Info: Phenergan Promethazine Psychotropic Info: Seroquel          Current Medications (05/01/2016):  This is the current hospital active medication list Current Facility-Administered Medications  Medication Dose Route Frequency Provider Last Rate Last Dose  . acetaminophen (TYLENOL) tablet 650 mg  650 mg Oral Q6H PRN Kerrin Mo, MD   650 mg at 05/01/16 0945   Or  . acetaminophen (TYLENOL) suppository 650 mg  650 mg Rectal Q6H PRN Kerrin Mo, MD      . amLODipine (NORVASC) tablet 5 mg  5 mg Oral Daily Rhetta Mura, MD   5 mg at 05/01/16 1315  . cefTRIAXone (ROCEPHIN) 2 g in dextrose 5 % 50 mL IVPB  2 g Intravenous Q24H Elease Etienne, MD   2 g at 04/30/16 2030  . clopidogrel (PLAVIX) tablet 75 mg  75 mg Oral Daily Rhetta Mura, MD   75 mg at 05/01/16 0946  . donepezil (ARICEPT) tablet 10 mg  10 mg Oral QHS Nishant Dhungel, MD   10 mg at 04/30/16 2029  . fentaNYL (SUBLIMAZE) injection   Intravenous PRN Berdine Dance, MD   50 mcg at 04/25/16 1457  . haloperidol lactate (HALDOL) injection 1 mg  1 mg Intravenous Q6H PRN Nishant Dhungel, MD   1 mg at 04/28/16 0804  . heparin injection 5,000 Units  5,000 Units Subcutaneous Q8H Ralene Muskrat, PA-C   5,000 Units at 05/01/16 1316  . hydrALAZINE (APRESOLINE) tablet 10 mg  10 mg  Oral Q8H PRN Kerrin Moobin Edwin, MD   10 mg at 04/23/16 2339  . levothyroxine (SYNTHROID, LEVOTHROID) tablet 50 mcg  50 mcg Oral Once per day on Mon Wed Fri Nishant Dhungel, MD   50 mcg at 04/30/16 0856  . midazolam (VERSED) injection   Intravenous PRN Berdine DanceMichael Shick, MD   1 mg at 04/25/16 1456  . morphine 2 MG/ML injection 2 mg  2 mg Intravenous Q4H PRN Kerrin Moobin Edwin, MD   2 mg at 04/26/16 [id number]  . ondansetron (ZOFRAN) injection 4 mg  4 mg Intravenous Q6H Rhetta MuraJai-Gurmukh Samtani, MD   Stopped at 05/01/16 1316  . potassium chloride SA (K-DUR,KLOR-CON) CR tablet 40 mEq  40 mEq Oral Daily Rhetta MuraJai-Gurmukh Samtani, MD   40 mEq at 05/01/16 0946  . QUEtiapine (SEROQUEL) tablet 12.5 mg  12.5 mg Oral QHS Nishant Dhungel, MD   12.5 mg at 04/30/16 2029      Discharge Medications: Please see discharge summary for a list of discharge medications.  Relevant Imaging Results:  Relevant Lab Results:   Additional Information SSN: [id number]3328  Jowana Thumma S Linn GroveRayyan, [place]

## 2016-05-01 NOTE — Discharge Instructions (Addendum)
Flush drain with 5cc of normal saline daily  Soft-Food Meal Plan A soft-food meal plan includes foods that are safe and easy to swallow. This meal plan typically is used:  If you are having trouble chewing or swallowing foods.  As a transition meal plan after only having had liquid meals for a long period. What do I need to know about the soft-food meal plan? A soft-food meal plan includes tender foods that are soft and easy to chew and swallow. In most cases, bite-sized pieces of food are easier to swallow. A bite-sized piece is about  inch or smaller. Foods in this plan do not need to be ground or pureed. Foods that are very hard, crunchy, or sticky should be avoided. Also, breads, cereals, yogurts, and desserts with nuts, seeds, or fruits should be avoided. What foods can I eat? Grains  Rice and wild rice. Moist bread, dressing, pasta, and noodles. Well-moistened dry or cooked cereals, such as farina (cooked wheat cereal), oatmeal, or grits. Biscuits, breads, muffins, pancakes, and waffles that have been well moistened. Vegetables  Shredded lettuce. Cooked, tender vegetables, including potatoes without skins. Vegetable juices. Broths or creamed soups made with vegetables that are not stringy or chewy. Strained tomatoes (without seeds). Fruits  Canned or well-cooked fruits. Soft (ripe), peeled fresh fruits, such as peaches, nectarines, kiwi, cantaloupe, honeydew melon, and watermelon (without seeds). Soft berries with small seeds, such as strawberries. Fruit juices (without pulp). Meats and Other Protein Sources  Moist, tender, lean beef. Mutton. Lamb. Veal. Chicken. [place]key. Liver. Ham. Fish without bones. Eggs. Dairy  Milk, milk drinks, and cream. Plain cream cheese and cottage cheese. Plain yogurt. Sweets/Desserts  Flavored gelatin desserts. Custard. Plain ice cream, frozen yogurt, sherbet, milk shakes, and malts. Plain cakes and cookies. Plain hard candy. Other  Butter, margarine  (without trans fat), and cooking oils. Mayonnaise. Cream sauces. Mild spices, salt, and sugar. Syrup, molasses, honey, and jelly. The items listed above may not be a complete list of recommended foods or beverages. Contact your dietitian for more options.  What foods are not recommended? Grains  Dry bread, toast, crackers that have not been moistened. Coarse or dry cereals, such as bran, granola, and shredded wheat. Tough or chewy crusty breads, such as [place] bread or baguettes. Vegetables  Corn. Raw vegetables except shredded lettuce. Cooked vegetables that are tough or stringy. Tough, crisp, fried potatoes and potato skins. Fruits  Fresh fruits with skins or seeds or both, such as apples, pears, or grapes. Stringy, high-pulp fruits, such as papaya, pineapple, coconut, or mango. Fruit leather, fruit roll-ups, and all dried fruits. Meats and Other Protein Sources  Sausages and hot dogs. Meats with gristle. Fish with bones. Nuts, seeds, and chunky peanut or other nut butters. Sweets/Desserts  Cakes or cookies that are very dry or chewy. The items listed above may not be a complete list of foods and beverages to avoid. Contact your dietitian for more information.  This information is not intended to replace advice given to you by your health care provider. Make sure you discuss any questions you have with your health care provider. Document Released: 07/15/2007 Document Revised: 09/13/2015 Document Reviewed: 03/04/2013 Elsevier Interactive Patient Education  2017 [employer]

## 2016-05-01 NOTE — Progress Notes (Signed)
Physical Therapy Treatment Patient Details Name: Tara Hicks MRN: [id number] DOB: 1922-09-29 Today's Date: 05/01/2016    History of Present Illness Pt is a 83 y.o. female with PMHx of Dementia, GERD, HTN, TIA, Leaky heart valve presenting with abdominal pain   Cholelithiasis, drain placed in R Lower Quadrant at site of abscess.    PT Comments    Patient continues to complain of constant pain in abdomen during transfers, mobility and ambulation. Pain, fatigue and cognition status are limiting factors in her treatment. Patient to discharge to SNF; will inform supervising PT of change in recommendation based on patient presentation.    Follow Up Recommendations  SNF     Equipment Recommendations  Other (comment) (TBA at next venue. )    Recommendations for Other Services       Precautions / Restrictions Precautions Precautions: Fall;Other (comment) Precaution Comments: gait belt placed higher due to drain placement in R lower quadrant of abdomen.  Restrictions Weight Bearing Restrictions: No    Mobility  Bed Mobility Overal bed mobility: Needs Assistance;Modified Independent Bed Mobility: Supine to Sit;Sit to Supine     Supine to sit: Min assist;Mod assist Sit to supine: Min assist   General bed mobility comments: Required min-mod A during mobility getting in and out of bed with B UE support with bed railing and v/c for repositioning.   Transfers Overall transfer level: Needs assistance Equipment used: Rolling walker (2 wheeled) Transfers: Sit to/from Stand Sit to Stand: Min guard         General transfer comment: Min guard for safety; Patient impulsive during first sit to stand; patient required v/c for hand placement on bed instead of walker for second sit to stand.   Ambulation/Gait Ambulation/Gait assistance: Min guard Ambulation Distance (Feet): 140 Feet Assistive device: Rolling walker (2 wheeled) Gait Pattern/deviations: Step-through pattern;Trunk  flexed;Drifts right/left Gait velocity: reduced   General Gait Details: cues for RW safety to maintain space within the walker and to correct lateral drifting.    Stairs            Wheelchair Mobility    Modified Rankin (Stroke Patients Only)       Balance Overall balance assessment: Needs assistance Sitting-balance support: Bilateral upper extremity supported Sitting balance-Leahy Scale: Fair     Standing balance support: Bilateral upper extremity supported Standing balance-Leahy Scale: Poor Standing balance comment: min guard for safety                    Cognition Arousal/Alertness: Awake/alert Behavior During Therapy: WFL for tasks assessed/performed;Impulsive Overall Cognitive Status: History of cognitive impairments - at baseline                 General Comments: patient presents with safety concerns     Exercises      General Comments        Pertinent Vitals/Pain Pain Assessment: 0-10 Pain Score: 4  Pain Location: abdomen Pain Descriptors / Indicators: Discomfort;Aching Pain Intervention(s): Repositioned;Monitored during session    Home Living                      Prior Function            PT Goals (current goals can now be found in the care plan section) Acute Rehab PT Goals Patient Stated Goal: none stated PT Goal Formulation: Patient unable to participate in goal setting Potential to Achieve Goals: Good Progress towards PT goals: Progressing toward goals    Frequency  Min 3X/week      PT Plan Discharge plan needs to be updated    Co-evaluation             End of Session Equipment Utilized During Treatment: Gait belt Activity Tolerance: Patient limited by fatigue;Patient tolerated treatment well Patient left: in bed;with bed alarm set     Time: 1410-1425 PT Time Calculation (min) (ACUTE ONLY): 15 min  Charges:  $Gait Training: 8-22 mins                    G Codes:      [facility] 05/27/16, 2:44 PM  Kerrin Mo, [place] Pager [phone]

## 2016-05-01 NOTE — Progress Notes (Signed)
Patient ID: Tara Hicks, female   DOB: 16-May-1922, 83 y.o.   MRN: [id number]  [facility] Surgery Progress Note     Subjective: Patient sitting up in bed eating breakfast. States that she does still have some lower abdominal pain, but it is no worse than yesterday. Pain does not increase with eating. Feels better when she is lying down rather than sitting up. Denies n/v.  Objective: Vital signs in last 24 hours: Temp:  [98.6 F (37 C)-99.7 F (37.6 C)] 99.7 F (37.6 C) (01/11 0602) Pulse Rate:  [70-84] 84 (01/11 0602) Resp:  [17-18] 17 (01/11 0602) BP: (138-142)/(54-60) 138/54 (01/11 0602) SpO2:  [96 %-98 %] 96 % (01/11 0602) Last BM Date: 04/30/16  Intake/Output from previous day: 01/10 0701 - 01/11 0700 In: 50 [IV Piggyback:50] Out: 200 [Urine:200] Intake/Output this shift: Total I/O In: -  Out: 20 [Urine:20]  PE: Gen:  Alert, NAD, pleasant Card:  RRR Pulm:  CTAB, no W/R/R, effort normal Abd: Soft, NT/ND, no HSM, drain site clean, drain empty  Lab Results:  No results for input(s): WBC, HGB, HCT, PLT in the last 72 hours. BMET No results for input(s): NA, K, CL, CO2, GLUCOSE, BUN, CREATININE, CALCIUM in the last 72 hours. PT/INR No results for input(s): LABPROT, INR in the last 72 hours. CMP     Component Value Date/Time   NA 137 04/28/2016 0808   K 4.0 04/28/2016 0808   CL 102 04/28/2016 0808   CO2 21 (L) 04/28/2016 0808   GLUCOSE 92 04/28/2016 0808   BUN 12 04/28/2016 0808   CREATININE 1.03 (H) 04/28/2016 0808   CALCIUM 9.4 04/28/2016 0808   PROT 5.8 (L) 04/20/2016 0824   ALBUMIN 3.0 (L) 04/20/2016 0824   AST 18 04/20/2016 0824   ALT 12 (L) 04/20/2016 0824   ALKPHOS 62 04/20/2016 0824   BILITOT 0.9 04/20/2016 0824   GFRNONAA 46 (L) 04/28/2016 0808   GFRAA 53 (L) 04/28/2016 0808   Lipase     Component Value Date/Time   LIPASE 10 (L) 04/19/2016 1235       Studies/Results: Ct Abdomen Pelvis W Contrast  Result Date: 04/29/2016 CLINICAL  DATA:  Status post drainage of peritoneal abscess. EXAM: CT ABDOMEN AND PELVIS WITH CONTRAST TECHNIQUE: Multidetector CT imaging of the abdomen and pelvis was performed using the standard protocol following bolus administration of intravenous contrast. CONTRAST:  ISOVUE-300 IOPAMIDOL (ISOVUE-300) INJECTION 61% COMPARISON:  CT scan of April 25, 2016. FINDINGS: Lower chest: No acute abnormality.  Stable hiatal hernia. Hepatobiliary: Cholelithiasis is noted. No focal abnormality is noted in the liver. Pancreas: Unremarkable. No pancreatic ductal dilatation or surrounding inflammatory changes. Spleen: Normal in size without focal abnormality. Adrenals/Urinary Tract: Adrenal glands are unremarkable. Kidneys are normal, without renal calculi, focal lesion, or hydronephrosis. Bladder is unremarkable. Stomach/Bowel: Diverticulosis of descending and sigmoid colon is noted without inflammation. There is no evidence of bowel obstruction. The appendix appears normal. Vascular/Lymphatic: Aortic atherosclerosis. No enlarged abdominal or pelvic lymph nodes. Reproductive: Uterus and bilateral adnexa are unremarkable. Other: Drainage catheter is seen with tip anteriorly in the left lower quadrant of the abdomen. No significant amount of fluid remains around the catheter tip. Musculoskeletal: Multilevel degenerative disc disease is noted in the lumbar spine. IMPRESSION: Cholelithiasis. Aortic atherosclerosis. Diverticulosis of descending and sigmoid colon without inflammation. Status post placement of drainage catheter into fluid collection in left lower quadrant of abdomen. No significant amount of fluid is noted around the catheter tip. Electronically Signed   By:  Lupita RaiderJames  Green Jr, M.D.   On: 04/29/2016 10:03   Ir Sinus/fist Tube Chk-non Gi  Result Date: 04/30/2016 INDICATION: Diverticular abscess, status post percutaneous drainage EXAM: SINUS TRACT INJECTION/FISTULOGRAM MEDICATIONS: The patient is currently admitted to  the hospital and receiving intravenous antibiotics. The antibiotics were administered within an appropriate time frame prior to the initiation of the procedure. ANESTHESIA/SEDATION: None. COMPLICATIONS: None immediate. PROCEDURE: Informed written consent was obtained from the patient after a thorough discussion of the procedural risks, benefits and alternatives. All questions were addressed. Maximal Sterile Barrier Technique was utilized including caps, mask, sterile gowns, sterile gloves, sterile drape, hand hygiene and skin antiseptic. A timeout was performed prior to the initiation of the procedure. Under sterile conditions, the existing left lower quadrant drain was injected with contrast. Small partially collapsed residual abscess cavity present. Leakage at the skin site noted. No fistula to the adjacent colon. Images obtained for documentation. IMPRESSION: Small partially collapsed abscess at the drain catheter site. Negative for fistula to the colon. Continue JP bulb suction to evacuate the small residual fluid collection. Electronically Signed   By: Judie PetitM.  Shick M.D.   On: 04/30/2016 14:40    Anti-infectives: Anti-infectives    Start     Dose/Rate Route Frequency Ordered Stop   04/30/16 2030  cefTRIAXone (ROCEPHIN) 2 g in dextrose 5 % 50 mL IVPB     2 g 100 mL/hr over 30 Minutes Intravenous Every 24 hours 04/30/16 1935     04/30/16 1000  amoxicillin-clavulanate (AUGMENTIN) 500-125 MG per tablet 500 mg  Status:  Discontinued     1 tablet Oral Every 12 hours 04/30/16 0956 04/30/16 1935   04/29/16 2200  amoxicillin-clavulanate (AUGMENTIN) 875-125 MG per tablet 1 tablet  Status:  Discontinued     1 tablet Oral Every 12 hours 04/29/16 1822 04/30/16 0955   04/20/16 0830  piperacillin-tazobactam (ZOSYN) IVPB 3.375 g  Status:  Discontinued     3.375 g 12.5 mL/hr over 240 Minutes Intravenous Every 8 hours 04/20/16 0825 04/29/16 1822   04/20/16 0230  ciprofloxacin (CIPRO) IVPB 400 mg  Status:   Discontinued     400 mg 200 mL/hr over 60 Minutes Intravenous Every 12 hours 04/19/16 1938 04/20/16 0812   04/20/16 0000  metroNIDAZOLE (FLAGYL) IVPB 500 mg  Status:  Discontinued     500 mg 100 mL/hr over 60 Minutes Intravenous Every 8 hours 04/19/16 1938 04/20/16 0812   04/19/16 1430  metroNIDAZOLE (FLAGYL) IVPB 500 mg     500 mg 100 mL/hr over 60 Minutes Intravenous  Once 04/19/16 1428 04/19/16 1543   04/19/16 1430  ciprofloxacin (CIPRO) tablet 500 mg     500 mg Oral  Once 04/19/16 1428 04/19/16 1434       Assessment/Plan Acute colitis/diverticulitis with microperforation-no evidence of sepsis - CT guided drain placed 1/5 - WBC WNL 1/8 and drain output decreasing - CT scan 1/9 showed abscess resolved - IR injected drain 1/10 which showed cavity persists therefore decided to leave drain  Dementia Hypothyroidism Essential hypertension Urinary tract infection  ID:  Cipro 12/30>>12/31  Flagyl 12/30>>12/31 Zosyn 12/31>>1/9 Rocephin 1/10>> FEN: soft diet VTE: SCDs, Heparin, Plavix  Plan: Ok for discharge from surgical standpoint. Recommend continuing soft diet and continuing antibiotics for 1 additional week. Follow-up with Dr. Donell BeersByerly after drain clinic appointment (about 3 weeks).   LOS: 12 days    Edson SnowballBROOKE A MILLER , [facility] 05/01/2016, 9:33 AM Pager: [phone] Consults: [phone] Mon-Fri 7:00 am-4:30 pm Sat-Sun 7:00  am-11:30 am

## 2016-05-01 NOTE — Progress Notes (Signed)
[name] Anticipated DC date: 05/01/16 Family notified: Son Transport by: Sharin MonsPTAR 4:30pm   Per MD [facility] RN, patient, patient's family, and facility notified of DC. Discharge Summary sent to facility. RN given number for report [phone]). DC packet on chart. Ambulance transport requested for patient.   CSW signing off.  Cristobal GoldmannNadia Love [place], [place] Clinical Social Worker [phone]

## 2016-05-02 ENCOUNTER — Other Ambulatory Visit: Payer: Self-pay | Admitting: General Surgery

## 2016-05-02 DIAGNOSIS — K5720 Diverticulitis of large intestine with perforation and abscess without bleeding: Secondary | ICD-10-CM

## 2016-05-08 ENCOUNTER — Other Ambulatory Visit: Payer: 59

## 2016-05-15 ENCOUNTER — Other Ambulatory Visit: Payer: 59

## 2018-02-19 ENCOUNTER — Encounter (HOSPITAL_BASED_OUTPATIENT_CLINIC_OR_DEPARTMENT_OTHER): Payer: Self-pay

## 2018-02-19 ENCOUNTER — Other Ambulatory Visit: Payer: Self-pay

## 2018-02-19 ENCOUNTER — Emergency Department (HOSPITAL_BASED_OUTPATIENT_CLINIC_OR_DEPARTMENT_OTHER)
Admission: EM | Admit: 2018-02-19 | Discharge: 2018-02-20 | Disposition: A | Discharge status: Home / Self Care | Payer: Medicare Other | Attending: Emergency Medicine | Admitting: Emergency Medicine

## 2018-02-19 DIAGNOSIS — I10 Essential (primary) hypertension: Secondary | ICD-10-CM | POA: Insufficient documentation

## 2018-02-19 DIAGNOSIS — Z7902 Long term (current) use of antithrombotics/antiplatelets: Secondary | ICD-10-CM | POA: Insufficient documentation

## 2018-02-19 DIAGNOSIS — Z79899 Other long term (current) drug therapy: Secondary | ICD-10-CM | POA: Insufficient documentation

## 2018-02-19 DIAGNOSIS — K0889 Other specified disorders of teeth and supporting structures: Secondary | ICD-10-CM | POA: Diagnosis present

## 2018-02-19 DIAGNOSIS — F0390 Unspecified dementia without behavioral disturbance: Secondary | ICD-10-CM | POA: Diagnosis not present

## 2018-02-19 DIAGNOSIS — K047 Periapical abscess without sinus: Secondary | ICD-10-CM | POA: Insufficient documentation

## 2018-02-19 DIAGNOSIS — E039 Hypothyroidism, unspecified: Secondary | ICD-10-CM | POA: Insufficient documentation

## 2018-02-19 NOTE — ED Provider Notes (Signed)
[facility] EMERGENCY DEPARTMENT Provider Note   CSN: [id number] Arrival date & time: 02/19/18  2258     History   Chief Complaint Chief Complaint  Patient presents with  . Dental Pain    HPI Tara Hicks is a 84 y.o. female.  Level 5 caveat for dementia.  Patient sent from living facility with dental pain for the past day.  Patient complains of pain in her mouth but does not know why she is here.  She is oriented to person and place.  Nursing facility reports she has a dental abscess and has had difficulty eating today.  No known fever, chills, nausea or vomiting.  No known trauma.  Patient does take Plavix.  The history is provided by the patient and the EMS personnel. The history is limited by the condition of the patient.  Dental Pain      Past Medical History:  Diagnosis Date  . Arthritis   . Cataract   . Complication of anesthesia   . Dementia (HCC)   . GERD (gastroesophageal reflux disease)   . Hypertension   . Hypothyroidism   . Leaky heart valve    Hx: of  . PONV (postoperative nausea and vomiting)   . TIA (transient ischemic attack)    Hx: of 2014    Patient Active Problem List   Diagnosis Date Noted  . Diverticulitis of colon with perforation 04/24/2016  . Abdominal pain 04/19/2016  . Urinary tract infection 04/19/2016  . Sacral fracture, closed (HCC) 01/28/2016  . Dementia (HCC) 01/28/2016  . Hypothyroidism 01/28/2016  . Essential hypertension 01/28/2016    Past Surgical History:  Procedure Laterality Date  . BREAST SURGERY     Hx: of left breast cyst removal  . CATARACT EXTRACTION    . COLONOSCOPY     Hx: of  . DILATION AND CURETTAGE OF UTERUS    . IR GENERIC HISTORICAL  04/30/2016   IR SINUS/FIST TUBE CHK-NON GI 04/30/2016 Berdine Dance, MD [facility]  . LUMBAR LAMINECTOMY/DECOMPRESSION MICRODISCECTOMY Left 01/31/2013   Procedure: Left lumbar five-sacral one laminectomy for synovial cyst;  Surgeon: Reinaldo Meeker, MD;   Location: [facility];  Service: Neurosurgery;  Laterality: Left;     OB History   None      Home Medications    Prior to Admission medications   Medication Sig Start Date End Date Taking? Authorizing Provider  CHOLECALCIFEROL PO Take 650 mg by mouth.   Yes [provider]  citalopram (CELEXA) 10 MG tablet Take 10 mg by mouth daily.   Yes [provider]  divalproex (DEPAKOTE SPRINKLE) 125 MG capsule Take by mouth 2 (two) times daily.   Yes [provider]  acetaminophen (TYLENOL) 325 MG tablet Take 2 tablets (650 mg total) by mouth every 6 (six) hours as needed for mild pain, moderate pain, fever or headache (or Fever >/= 101). 05/01/16   Hongalgi, Maximino Greenland, MD  amLODipine (NORVASC) 5 MG tablet Take 1 tablet (5 mg total) by mouth daily. 05/01/16   Hongalgi, Maximino Greenland, MD  clopidogrel (PLAVIX) 75 MG tablet Take 75 mg by mouth daily.    [provider]  donepezil (ARICEPT) 10 MG tablet Take 10 mg by mouth at bedtime.    [provider]  levothyroxine (SYNTHROID, LEVOTHROID) 50 MCG tablet Take 50 mcg by mouth every Monday, Wednesday, and Friday.     [provider]  Magnesium Hydroxide (MILK OF MAGNESIA PO) Take 30 mLs by mouth daily  as needed (constipation).     [provider]  Melatonin 5 MG TABS Take 5 mg by mouth at bedtime.     [provider]  ondansetron (ZOFRAN) 4 MG tablet Take 4 mg by mouth every 4 (four) hours as needed for nausea.     [provider]  QUEtiapine (SEROQUEL) 25 MG tablet Take 0.5 tablets (12.5 mg total) by mouth at bedtime. 05/01/16   Hongalgi, Maximino Greenland, MD  vitamin B-12 (CYANOCOBALAMIN) 500 MCG tablet Take 500 mcg by mouth daily.    [provider]    Family History Family History  Problem Relation Age of Onset  . Diabetes Brother   . Hypertension Brother   . Cancer - Colon Sister     Social History Social History   Tobacco Use  . Smoking status: Never Smoker  .  Smokeless tobacco: Never Used  Substance Use Topics  . Alcohol use: No  . Drug use: No     Allergies   Benzodiazepines and Phenergan [promethazine]   Review of Systems Review of Systems  Unable to perform ROS: Dementia     Physical Exam Updated Vital Signs BP (!) 141/78   Pulse (!) 54   Temp 97.7 F (36.5 C) (Oral)   Ht 5\' 7"  (1.702 m)   Wt 53 kg   SpO2 96%   BMI 18.30 kg/m   Physical Exam  Constitutional: She appears well-developed and well-nourished. No distress.  HENT:  Head: Normocephalic and atraumatic.  Mouth/Throat: Oropharynx is clear and moist. No oropharyngeal exudate.  There is a pustule along the right upper gingiva that is tender to palpation Teeth are nontender and intact.  Floor mouth is soft, no trismus or malocclusion. No difficulty breathing or difficulty swallowing.  Eyes: Pupils are equal, round, and reactive to light. Conjunctivae and EOM are normal.  Neck: Normal range of motion. Neck supple.  No meningismus.  Cardiovascular: Normal rate, regular rhythm, normal heart sounds and intact distal pulses.  No murmur heard. Pulmonary/Chest: Effort normal and breath sounds normal. No respiratory distress.  Abdominal: Soft. There is no tenderness. There is no rebound and no guarding.  Musculoskeletal: Normal range of motion. She exhibits no edema or tenderness.  Neurological: She is alert. No cranial nerve deficit. She exhibits normal muscle tone. Coordination normal.  Patient oriented to person and place.  No ataxia on finger to nose bilaterally. No pronator drift. 5/5 strength throughout. CN 2-12 intact.Equal grip strength. Sensation intact.   Skin: Skin is warm.  Psychiatric: She has a normal mood and affect. Her behavior is normal.  Nursing note and vitals reviewed.    ED Treatments / Results  Labs (all labs ordered are listed, but only abnormal results are displayed) Labs Reviewed - No data to display  EKG None  Radiology No results  found.  Procedures .Marland KitchenIncision and Drainage Date/Time: 02/20/2018 12:42 AM Performed by: Glynn Octave, MD Authorized by: Glynn Octave, MD   Consent:    Consent obtained:  Verbal   Consent given by:  Patient   Risks discussed:  Incomplete drainage, pain, infection and bleeding   Alternatives discussed:  No treatment Location:    Type:  Abscess   Size:  1   Location:  Mouth   Mouth location:  Alveolar process Anesthesia (see MAR for exact dosages):    Anesthesia method:  Topical application   Topical anesthetic:  Benzocaine gel Procedure type:    Complexity:  Simple Procedure details:    Needle aspiration: yes  Needle size:  18 G   Incision types:  Stab incision   Incision depth:  Submucosal   Drainage:  Purulent   Drainage amount:  Moderate   Wound treatment:  Wound left open   Packing materials:  None Post-procedure details:    Patient tolerance of procedure:  Tolerated well, no immediate complications   (including critical care time)  Medications Ordered in ED Medications - No data to display   Initial Impression / Assessment and Plan / ED Course  I have reviewed the triage vital signs and the nursing notes.  Pertinent labs & imaging results that were available during my care of the patient were reviewed by me and considered in my medical decision making (see chart for details).    Dental pain with small abscess along gingiva.  No evidence of Ludwig's angina.  Incision and drainage performed as above.  Patient started on antibiotics.  Discussed with facility performing swish and gargles. There is no evidence of Ludwig's angina.  Patient will need close follow-up with her dentist ASAP.  Return precautions discussed.  Final Clinical Impressions(s) / ED Diagnoses   Final diagnoses:  Dental abscess    ED Discharge Orders    None       Raybon Conard, Jeannett Senior, MD 02/20/18 [phone]

## 2018-02-19 NOTE — ED Triage Notes (Signed)
Pt arrived via [facility] from [facility]. EMS report that staff states pt has a dental abscess and is not eating well. Problem was noted this AM.

## 2018-02-20 DIAGNOSIS — K047 Periapical abscess without sinus: Secondary | ICD-10-CM | POA: Diagnosis not present

## 2018-02-20 MED ORDER — CEPHALEXIN 500 MG PO CAPS: 500.0000 mg | ORAL_CAPSULE | Freq: Two times a day (BID) | ORAL | 0 refills | Status: DC

## 2018-02-20 MED ORDER — CEPHALEXIN 250 MG PO CAPS
500.0000 mg | ORAL_CAPSULE | Freq: Once | ORAL | Status: AC
  Administered 2018-02-20: 500 mg via ORAL
  Filled 2018-02-20: qty 2

## 2018-02-20 NOTE — ED Notes (Signed)
Called patients facility and gave report to [facility]

## 2018-02-20 NOTE — ED Notes (Signed)
Contacted [facility] to transport patient to [facility] @  [address] [place], [place]

## 2018-02-20 NOTE — Discharge Instructions (Addendum)
Take the antibiotics as prescribed and perform warm swishes and swallows with salt water but do not swallow the salt water.  Follow-up with your dentist for recheck on Monday.  Return to the ED with difficulty breathing, difficulty swallowing or other concerns.

## 2018-06-23 ENCOUNTER — Emergency Department (HOSPITAL_BASED_OUTPATIENT_CLINIC_OR_DEPARTMENT_OTHER): Payer: Medicare Other

## 2018-06-23 ENCOUNTER — Emergency Department (HOSPITAL_BASED_OUTPATIENT_CLINIC_OR_DEPARTMENT_OTHER)
Admission: EM | Admit: 2018-06-23 | Discharge: 2018-06-24 | Disposition: A | Discharge status: Home / Self Care | Payer: Medicare Other | Attending: Emergency Medicine | Admitting: Emergency Medicine

## 2018-06-23 ENCOUNTER — Encounter (HOSPITAL_BASED_OUTPATIENT_CLINIC_OR_DEPARTMENT_OTHER): Payer: Self-pay

## 2018-06-23 ENCOUNTER — Other Ambulatory Visit: Payer: Self-pay

## 2018-06-23 DIAGNOSIS — Y9389 Activity, other specified: Secondary | ICD-10-CM | POA: Insufficient documentation

## 2018-06-23 DIAGNOSIS — R0789 Other chest pain: Secondary | ICD-10-CM | POA: Diagnosis not present

## 2018-06-23 DIAGNOSIS — K449 Diaphragmatic hernia without obstruction or gangrene: Secondary | ICD-10-CM | POA: Insufficient documentation

## 2018-06-23 DIAGNOSIS — Z7902 Long term (current) use of antithrombotics/antiplatelets: Secondary | ICD-10-CM | POA: Insufficient documentation

## 2018-06-23 DIAGNOSIS — Y998 Other external cause status: Secondary | ICD-10-CM | POA: Diagnosis not present

## 2018-06-23 DIAGNOSIS — Z8673 Personal history of transient ischemic attack (TIA), and cerebral infarction without residual deficits: Secondary | ICD-10-CM | POA: Diagnosis not present

## 2018-06-23 DIAGNOSIS — W010XXA Fall on same level from slipping, tripping and stumbling without subsequent striking against object, initial encounter: Secondary | ICD-10-CM | POA: Diagnosis not present

## 2018-06-23 DIAGNOSIS — Z79899 Other long term (current) drug therapy: Secondary | ICD-10-CM | POA: Diagnosis not present

## 2018-06-23 DIAGNOSIS — E039 Hypothyroidism, unspecified: Secondary | ICD-10-CM | POA: Insufficient documentation

## 2018-06-23 DIAGNOSIS — M25551 Pain in right hip: Secondary | ICD-10-CM | POA: Insufficient documentation

## 2018-06-23 DIAGNOSIS — M542 Cervicalgia: Secondary | ICD-10-CM | POA: Diagnosis not present

## 2018-06-23 DIAGNOSIS — W19XXXA Unspecified fall, initial encounter: Secondary | ICD-10-CM

## 2018-06-23 DIAGNOSIS — I10 Essential (primary) hypertension: Secondary | ICD-10-CM | POA: Insufficient documentation

## 2018-06-23 DIAGNOSIS — F0390 Unspecified dementia without behavioral disturbance: Secondary | ICD-10-CM | POA: Diagnosis not present

## 2018-06-23 DIAGNOSIS — R51 Headache: Secondary | ICD-10-CM | POA: Diagnosis present

## 2018-06-23 LAB — COMPREHENSIVE METABOLIC PANEL
ALBUMIN: 3.4 g/dL — AB (ref 3.5–5.0)
ALK PHOS: 67 U/L (ref 38–126)
ALT: 11 U/L (ref 0–44)
ANION GAP: 6 (ref 5–15)
AST: 14 U/L — ABNORMAL LOW (ref 15–41)
BILIRUBIN TOTAL: 0.4 mg/dL (ref 0.3–1.2)
BUN: 27 mg/dL — AB (ref 8–23)
CALCIUM: 8.8 mg/dL — AB (ref 8.9–10.3)
CO2: 26 mmol/L (ref 22–32)
Chloride: 104 mmol/L (ref 98–111)
Creatinine, Ser: 0.91 mg/dL (ref 0.44–1.00)
GFR calc Af Amer: >60 mL/min (ref >60)
GFR calc non Af Amer: 54 mL/min — ABNORMAL LOW (ref >60)
GLUCOSE: 104 mg/dL — AB (ref 70–99)
Potassium: 3.8 mmol/L (ref 3.5–5.1)
Sodium: 136 mmol/L (ref 135–145)
TOTAL PROTEIN: 5.9 g/dL — AB (ref 6.5–8.1)

## 2018-06-23 LAB — CBC WITH DIFFERENTIAL/PLATELET
Abs Immature Granulocytes: 0 10*3/uL (ref 0.00–0.07)
BASOS ABS: 0 10*3/uL (ref 0.0–0.1)
Basophils Relative: 0 %
EOS ABS: 0.2 10*3/uL (ref 0.0–0.5)
EOS PCT: 4 %
HCT: 35.2 % — ABNORMAL LOW (ref 36.0–46.0)
HEMOGLOBIN: 10.8 g/dL — AB (ref 12.0–15.0)
Immature Granulocytes: 0 %
LYMPHS ABS: 1.4 10*3/uL (ref 0.7–4.0)
LYMPHS PCT: 30 %
MCH: 26.6 pg (ref 26.0–34.0)
MCHC: 30.7 g/dL (ref 30.0–36.0)
MCV: 86.7 fL (ref 80.0–100.0)
Monocytes Absolute: 0.6 10*3/uL (ref 0.1–1.0)
Monocytes Relative: 12 %
NRBC: 0 % (ref 0.0–0.2)
Neutro Abs: 2.6 10*3/uL (ref 1.7–7.7)
Neutrophils Relative %: 54 %
Platelets: 178 10*3/uL (ref 150–400)
RBC: 4.06 MIL/uL (ref 3.87–5.11)
RDW: 13.9 % (ref 11.5–15.5)
WBC: 4.8 10*3/uL (ref 4.0–10.5)

## 2018-06-23 LAB — TROPONIN I

## 2018-06-23 NOTE — ED Notes (Signed)
Patient transported to X-ray 

## 2018-06-23 NOTE — ED Provider Notes (Signed)
[facility] EMERGENCY DEPARTMENT Provider Note   CSN: [id number] Arrival date & time: 06/23/18  2050    History   Chief Complaint Chief Complaint  Patient presents with  . Fall    HPI Tara Hicks is a 85 y.o. female.     HPI   85 year old female with a history of dementia, hypertension presents with concern for fall.  She arrives via [facility] from [facility] facility on the memory care unit after an unwitnessed fall.  She had reported headache, neck pain and right hip pain initially, but denied any pain to EMS.  On arrival to the ED, reported chest pain.  On my history, she reports right hip pain, headache and chest pain. Denies other symptoms.  Level 5 caveat, dementia.   Past Medical History:  Diagnosis Date  . Arthritis   . Cataract   . Complication of anesthesia   . Dementia (HCC)   . GERD (gastroesophageal reflux disease)   . Hypertension   . Hypothyroidism   . Leaky heart valve    Hx: of  . PONV (postoperative nausea and vomiting)   . TIA (transient ischemic attack)    Hx: of 2014    Patient Active Problem List   Diagnosis Date Noted  . Diverticulitis of colon with perforation 04/24/2016  . Abdominal pain 04/19/2016  . Urinary tract infection 04/19/2016  . Sacral fracture, closed (HCC) 01/28/2016  . Dementia (HCC) 01/28/2016  . Hypothyroidism 01/28/2016  . Essential hypertension 01/28/2016    Past Surgical History:  Procedure Laterality Date  . BREAST SURGERY     Hx: of left breast cyst removal  . CATARACT EXTRACTION    . COLONOSCOPY     Hx: of  . DILATION AND CURETTAGE OF UTERUS    . IR GENERIC HISTORICAL  04/30/2016   IR SINUS/FIST TUBE CHK-NON GI 04/30/2016 Berdine Dance, MD [facility]  . LUMBAR LAMINECTOMY/DECOMPRESSION MICRODISCECTOMY Left 01/31/2013   Procedure: Left lumbar five-sacral one laminectomy for synovial cyst;  Surgeon: Reinaldo Meeker, MD;  Location: [facility];  Service: Neurosurgery;   Laterality: Left;     OB History   No obstetric history on file.      Home Medications    Prior to Admission medications   Medication Sig Start Date End Date Taking? Authorizing Provider  acetaminophen (TYLENOL) 325 MG tablet Take 2 tablets (650 mg total) by mouth every 6 (six) hours as needed for mild pain, moderate pain, fever or headache (or Fever >/= 101). 05/01/16   Hongalgi, Maximino Greenland, MD  ALPRAZolam Prudy Feeler) 0.25 MG tablet Take 0.25 mg by mouth 2 (two) times daily as needed for anxiety.    [provider]  amLODipine (NORVASC) 5 MG tablet Take 1 tablet (5 mg total) by mouth daily. 05/01/16   Hongalgi, Maximino Greenland, MD  atorvastatin (LIPITOR) 20 MG tablet Take 20 mg by mouth at bedtime.    [provider]  cephALEXin (KEFLEX) 500 MG capsule Take 1 capsule (500 mg total) by mouth 2 (two) times daily. 02/20/18   Rancour, Jeannett Senior, MD  CHOLECALCIFEROL PO Take 1,000 mg by mouth.     [provider]  citalopram (CELEXA) 10 MG tablet Take 10 mg by mouth daily.    [provider]  clopidogrel (PLAVIX) 75 MG tablet Take 75 mg by mouth daily.    [provider]  divalproex (DEPAKOTE SPRINKLE) 125 MG capsule Take 125 mg by mouth at bedtime.     [provider]  donepezil (ARICEPT) 10 MG tablet Take 10 mg by mouth at bedtime.    [provider]  gabapentin (NEURONTIN) 100 MG capsule Take 100 mg by mouth 3 (three) times daily.    [provider]  HYDROcodone-acetaminophen (NORCO/VICODIN) 5-325 MG tablet Take 1 tablet by mouth every 12 (twelve) hours.    [provider]  HYDROcodone-acetaminophen (NORCO/VICODIN) 5-325 MG tablet Take 1 tablet by mouth every 6 (six) hours as needed for moderate pain.    [provider]  levothyroxine (SYNTHROID, LEVOTHROID) 50 MCG tablet Take 50 mcg by mouth daily before breakfast.     [provider]  Magnesium Hydroxide (MILK OF MAGNESIA PO) Take 30 mLs by mouth daily as needed  (constipation).     [provider]  meclizine (ANTIVERT) 12.5 MG tablet Take 12.5 mg by mouth 2 (two) times daily as needed for dizziness.    [provider]  Melatonin 5 MG TABS Take 5 mg by mouth at bedtime.     [provider]  ondansetron (ZOFRAN) 4 MG tablet Take 4 mg by mouth every 6 (six) hours as needed for nausea or vomiting.     [provider]  QUEtiapine (SEROQUEL) 25 MG tablet Take 0.5 tablets (12.5 mg total) by mouth at bedtime. Patient taking differently: Take 50 mg by mouth at bedtime.  05/01/16   Hongalgi, Maximino Greenland, MD  QUEtiapine (SEROQUEL) 50 MG tablet Take 50 mg by mouth 2 (two) times daily.    [provider]  vitamin B-12 (CYANOCOBALAMIN) 500 MCG tablet Take 500 mcg by mouth daily.    [provider]    Family History Family History  Problem Relation Age of Onset  . Diabetes Brother   . Hypertension Brother   . Cancer - Colon Sister     Social History Social History   Tobacco Use  . Smoking status: Never Smoker  . Smokeless tobacco: Never Used  Substance Use Topics  . Alcohol use: No  . Drug use: No     Allergies   Benzodiazepines and Phenergan [promethazine]   Review of Systems Review of Systems  Unable to perform ROS: Dementia  Constitutional: Negative for fever.  Respiratory: Negative for shortness of breath.   Cardiovascular: Positive for chest pain.  Gastrointestinal: Negative for nausea and vomiting.  Musculoskeletal: Positive for arthralgias and neck pain.  Neurological: Positive for headaches.     Physical Exam Updated Vital Signs BP (!) 148/85   Pulse 65   Temp 98.1 F (36.7 C) (Oral)   Resp 16   Ht 5\' 7"  (1.702 m)   Wt 53 kg   SpO2 95%   BMI 18.30 kg/m   Physical Exam Vitals signs and nursing note reviewed.  Constitutional:      General: She is not in acute distress.    Appearance: She is well-developed. She is not diaphoretic.  HENT:     Head: Normocephalic and  atraumatic.  Eyes:     Conjunctiva/sclera: Conjunctivae normal.  Neck:     Musculoskeletal: Normal range of motion.  Cardiovascular:     Rate and Rhythm: Normal rate and regular rhythm.     Heart sounds: Normal heart sounds. No murmur. No friction rub. No gallop.      Comments: No bruising +chest wall tenderness Pulmonary:     Effort: Pulmonary effort is normal. No respiratory distress.     Breath sounds: Normal breath sounds. No wheezing or rales.  Abdominal:     General:  There is no distension.     Palpations: Abdomen is soft.     Tenderness: There is no abdominal tenderness. There is no guarding.  Musculoskeletal:     Right hip: She exhibits tenderness.     Cervical back: She exhibits no bony tenderness.     Thoracic back: She exhibits no bony tenderness.     Lumbar back: She exhibits no bony tenderness.  Skin:    General: Skin is warm and dry.     Findings: No erythema or rash.  Neurological:     Mental Status: She is alert.      ED Treatments / Results  Labs (all labs ordered are listed, but only abnormal results are displayed) Labs Reviewed  CBC WITH DIFFERENTIAL/PLATELET - Abnormal; Notable for the following components:      Result Value   Hemoglobin 10.8 (*)    HCT 35.2 (*)    All other components within normal limits  COMPREHENSIVE METABOLIC PANEL - Abnormal; Notable for the following components:   Glucose, Bld 104 (*)    BUN 27 (*)    Calcium 8.8 (*)    Total Protein 5.9 (*)    Albumin 3.4 (*)    AST 14 (*)    GFR calc non Af Amer 54 (*)    All other components within normal limits  TROPONIN I    EKG EKG Interpretation  Date/Time:  Wednesday June 23 2018 22:39:47 EST Ventricular Rate:  63 PR Interval:    QRS Duration: 107 QT Interval:  442 QTC Calculation: 453 R Axis:   -25 Text Interpretation:  Sinus rhythm Borderline left axis deviation RSR' in V1 or V2, right VCD or RVH No significant change since last tracing Confirmed by Alvira Monday  ([id number]) on 06/23/2018 11:51:30 PM   Radiology Dg Chest 2 View  Result Date: 06/23/2018 CLINICAL DATA:  Chest pain after fall tonight. EXAM: CHEST - 2 VIEW COMPARISON:  Chest radiograph 08/14/2017 FINDINGS: Low lung volumes. Hypoventilation accentuates the cardiac size. Aortic atherosclerosis and tortuosity. Retrocardiac density likely corresponds to hiatal hernia as seen on prior exams. No acute airspace disease, large pleural effusion or pneumothorax. No acute osseous abnormalities are seen. IMPRESSION: 1. Low lung volumes without acute abnormality. 2.  Aortic Atherosclerosis (ICD10-I70.0). Electronically Signed   By: Narda Rutherford M.D.   On: 06/23/2018 23:45   Ct Head Wo Contrast  Result Date: 06/23/2018 CLINICAL DATA:  Headache and neck pain following an unwitnessed fall. The patient has dementia. EXAM: CT HEAD WITHOUT CONTRAST CT CERVICAL SPINE WITHOUT CONTRAST TECHNIQUE: Multidetector CT imaging of the head and cervical spine was performed following the standard protocol without intravenous contrast. Multiplanar CT image reconstructions of the cervical spine were also generated. COMPARISON:  Head CT dated 08/22/2017. Cervical spine CT dated 05/15/2016. FINDINGS: CT HEAD FINDINGS Brain: Stable moderately enlarged ventricles and subarachnoid spaces. Stable moderate patchy white matter low density in both cerebral hemispheres. Small, old right basal ganglia lacunar infarct. Small, old bilateral cerebellar hemisphere infarcts. No intracranial hemorrhage, mass lesion or CT evidence of acute infarction. Vascular: No hyperdense vessel or unexpected calcification. Skull: Normal. Negative for fracture or focal lesion. Sinuses/Orbits: Status post bilateral cataract extraction. Mild bilateral maxillary sinus mucosal thickening and moderate-sized right maxillary sinus retention cyst. Other: None. CT CERVICAL SPINE FINDINGS Alignment: Stable mild anterolisthesis at the C4-5 level and mild retrolisthesis at the C6-7  level. Stable mild anterolisthesis at the T1-2 and T2-3 levels. Stable mild dextroconvex cervicothoracic scoliosis. Skull base and vertebrae: No  acute fracture. No primary bone lesion or focal pathologic process. Soft tissues and spinal canal: No prevertebral fluid or swelling. No visible canal hematoma. Disc levels: Multilevel degenerative changes, without significant change. Upper chest: Clear lung apices. Other: Small bilateral thyroid nodules. The largest measures 8 mm anteriorly on the left. Dense bilateral carotid artery calcifications. IMPRESSION: 1. No skull fracture or intracranial hemorrhage. 2. No cervical spine fracture or traumatic subluxation. 3. Stable moderate diffuse cerebral and cerebellar atrophy. 4. Stable moderate chronic small vessel white matter ischemic changes in both cerebral hemispheres. 5. Old right basal ganglia lacunar infarct. 6. Small, old bilateral cerebellar hemisphere infarcts. 7. Stable multilevel cervical spine degenerative changes. 8. Dense bilateral carotid artery atheromatous calcifications. 9. Sub-centimeter thyroid nodule(s) noted, too small to characterize, but most likely benign in the absence of known clinical risk factors for thyroid carcinoma. Electronically Signed   By: Beckie Salts M.D.   On: 06/23/2018 21:52   Ct Cervical Spine Wo Contrast  Result Date: 06/23/2018 CLINICAL DATA:  Headache and neck pain following an unwitnessed fall. The patient has dementia. EXAM: CT HEAD WITHOUT CONTRAST CT CERVICAL SPINE WITHOUT CONTRAST TECHNIQUE: Multidetector CT imaging of the head and cervical spine was performed following the standard protocol without intravenous contrast. Multiplanar CT image reconstructions of the cervical spine were also generated. COMPARISON:  Head CT dated 08/22/2017. Cervical spine CT dated 05/15/2016. FINDINGS: CT HEAD FINDINGS Brain: Stable moderately enlarged ventricles and subarachnoid spaces. Stable moderate patchy white matter low density in  both cerebral hemispheres. Small, old right basal ganglia lacunar infarct. Small, old bilateral cerebellar hemisphere infarcts. No intracranial hemorrhage, mass lesion or CT evidence of acute infarction. Vascular: No hyperdense vessel or unexpected calcification. Skull: Normal. Negative for fracture or focal lesion. Sinuses/Orbits: Status post bilateral cataract extraction. Mild bilateral maxillary sinus mucosal thickening and moderate-sized right maxillary sinus retention cyst. Other: None. CT CERVICAL SPINE FINDINGS Alignment: Stable mild anterolisthesis at the C4-5 level and mild retrolisthesis at the C6-7 level. Stable mild anterolisthesis at the T1-2 and T2-3 levels. Stable mild dextroconvex cervicothoracic scoliosis. Skull base and vertebrae: No acute fracture. No primary bone lesion or focal pathologic process. Soft tissues and spinal canal: No prevertebral fluid or swelling. No visible canal hematoma. Disc levels: Multilevel degenerative changes, without significant change. Upper chest: Clear lung apices. Other: Small bilateral thyroid nodules. The largest measures 8 mm anteriorly on the left. Dense bilateral carotid artery calcifications. IMPRESSION: 1. No skull fracture or intracranial hemorrhage. 2. No cervical spine fracture or traumatic subluxation. 3. Stable moderate diffuse cerebral and cerebellar atrophy. 4. Stable moderate chronic small vessel white matter ischemic changes in both cerebral hemispheres. 5. Old right basal ganglia lacunar infarct. 6. Small, old bilateral cerebellar hemisphere infarcts. 7. Stable multilevel cervical spine degenerative changes. 8. Dense bilateral carotid artery atheromatous calcifications. 9. Sub-centimeter thyroid nodule(s) noted, too small to characterize, but most likely benign in the absence of known clinical risk factors for thyroid carcinoma. Electronically Signed   By: Beckie Salts M.D.   On: 06/23/2018 21:52   Dg Hip Unilat W Or Wo Pelvis 2-3 Views  Right  Result Date: 06/23/2018 CLINICAL DATA:  Right hip pain after fall tonight. EXAM: DG HIP (WITH OR WITHOUT PELVIS) 2-3V RIGHT COMPARISON:  Radiographs and CT 08/14/2017 FINDINGS: Three screws traverse prior femoral neck fracture. The hardware is intact. Slight irregularity of the right pubic body is unchanged from prior exam and likely related to overlap of enthesopathic change in degenerative change. No evidence of rami or  pelvic fracture. Pubic symphysis is congruent. Age related degenerative change of the hips. IMPRESSION: 1. No acute fracture or subluxation of the pelvis or right hip. 2. Intact right proximal femur hardware. Electronically Signed   By: Narda Rutherford M.D.   On: 06/23/2018 23:47    Procedures Procedures (including critical care time)  Medications Ordered in ED Medications - No data to display   Initial Impression / Assessment and Plan / ED Course  I have reviewed the triage vital signs and the nursing notes.  Pertinent labs & imaging results that were available during my care of the patient were reviewed by me and considered in my medical decision making (see chart for details).       85 year old female with a history of dementia, hypertension presents with concern for unwitnessed fall. History limited by dementia, and unclear if syncope.  No significant lab abnormalities.  Reports chest pain with chest wall tenderness. Does not give character. XR without abnormalities. Troponin negative.    CT head and CSpine without traumatic findings. XR hip without signs of fracture. No other signs of injury on hx and exam.    While sleeping, patient noted to have O2 down to 89% briefly. Overall, suspect this may be secondary to mild sleep apnea, however given limited history, will order CT PE study to evaluate for underlying pulmonary embolus, occult rib fractures or pneumothorax not seen on x-ray.  Care signed out to Dr. Read Drivers with CT pending.  If negative, patient is stable  for discharge back to her skilled nursing facility.  Final Clinical Impressions(s) / ED Diagnoses   Final diagnoses:  Fall, initial encounter  Chest wall pain    ED Discharge Orders    None       Alvira Monday, MD 06/24/18 0040

## 2018-06-23 NOTE — ED Triage Notes (Signed)
Pt arrives via [facility] from SNF [facility]). Pt on memory care unit (baseline dementia). Had unwitnessed fall (down for 30 minutes). Original c/o HA, neck pain, right hip pain (hx of right hip surg). Denied pain with EMS.  With EMS, pt AOx2, c-collar placed. VSS.  Upon arrival, pt alert to self, VSS. C/o pain in middle of chest. C-collar kept in place. No obvious deformities on assessment.

## 2018-06-24 ENCOUNTER — Emergency Department (HOSPITAL_BASED_OUTPATIENT_CLINIC_OR_DEPARTMENT_OTHER): Payer: Medicare Other

## 2018-06-24 DIAGNOSIS — R0789 Other chest pain: Secondary | ICD-10-CM | POA: Diagnosis not present

## 2018-06-24 MED ORDER — IOPAMIDOL (ISOVUE-370) INJECTION 76%
100.0000 mL | Freq: Once | INTRAVENOUS | Status: AC | PRN
  Administered 2018-06-24: 100 mL via INTRAVENOUS

## 2018-06-24 NOTE — ED Notes (Signed)
Contacted [facility] for transport for patient to [facility])

## 2018-06-24 NOTE — ED Provider Notes (Signed)
Nursing notes and vitals signs, including pulse oximetry, reviewed.  Summary of this visit's results, reviewed by myself:  EKG:  EKG Interpretation  Date/Time:  Wednesday June 23 2018 22:39:47 EST Ventricular Rate:  63 PR Interval:    QRS Duration: 107 QT Interval:  442 QTC Calculation: 453 R Axis:   -25 Text Interpretation:  Sinus rhythm Borderline left axis deviation RSR' in V1 or V2, right VCD or RVH No significant change since last tracing Confirmed by Alvira Monday ([id number]) on 06/23/2018 11:51:30 PM       Labs:  Results for orders placed or performed during the hospital encounter of 06/23/18 (from the past 24 hour(s))  CBC with Differential     Status: Abnormal   Collection Time: 06/23/18  9:13 PM  Result Value Ref Range   WBC 4.8 4.0 - 10.5 K/uL   RBC 4.06 3.87 - 5.11 MIL/uL   Hemoglobin 10.8 (L) 12.0 - 15.0 g/dL   HCT 53.6 (L) 64.4 - 03.4 %   MCV 86.7 80.0 - 100.0 fL   MCH 26.6 26.0 - 34.0 pg   MCHC 30.7 30.0 - 36.0 g/dL   RDW 74.2 59.5 - 63.8 %   Platelets 178 150 - 400 K/uL   nRBC 0.0 0.0 - 0.2 %   Neutrophils Relative % 54 %   Neutro Abs 2.6 1.7 - 7.7 K/uL   Lymphocytes Relative 30 %   Lymphs Abs 1.4 0.7 - 4.0 K/uL   Monocytes Relative 12 %   Monocytes Absolute 0.6 0.1 - 1.0 K/uL   Eosinophils Relative 4 %   Eosinophils Absolute 0.2 0.0 - 0.5 K/uL   Basophils Relative 0 %   Basophils Absolute 0.0 0.0 - 0.1 K/uL   Immature Granulocytes 0 %   Abs Immature Granulocytes 0.00 0.00 - 0.07 K/uL  Comprehensive metabolic panel     Status: Abnormal   Collection Time: 06/23/18  9:13 PM  Result Value Ref Range   Sodium 136 135 - 145 mmol/L   Potassium 3.8 3.5 - 5.1 mmol/L   Chloride 104 98 - 111 mmol/L   CO2 26 22 - 32 mmol/L   Glucose, Bld 104 (H) 70 - 99 mg/dL   BUN 27 (H) 8 - 23 mg/dL   Creatinine, Ser 7.56 0.44 - 1.00 mg/dL   Calcium 8.8 (L) 8.9 - 10.3 mg/dL   Total Protein 5.9 (L) 6.5 - 8.1 g/dL   Albumin 3.4 (L) 3.5 - 5.0 g/dL   AST 14 (L) 15 - 41 U/L    ALT 11 0 - 44 U/L   Alkaline Phosphatase 67 38 - 126 U/L   Total Bilirubin 0.4 0.3 - 1.2 mg/dL   GFR calc non Af Amer 54 (L) >60 mL/min   GFR calc Af Amer >60 >60 mL/min   Anion gap 6 5 - 15  Troponin I - ONCE - STAT     Status: None   Collection Time: 06/23/18  9:13 PM  Result Value Ref Range   Troponin I <0.03 <0.03 ng/mL    Imaging Studies: Dg Chest 2 View  Result Date: 06/23/2018 CLINICAL DATA:  Chest pain after fall tonight. EXAM: CHEST - 2 VIEW COMPARISON:  Chest radiograph 08/14/2017 FINDINGS: Low lung volumes. Hypoventilation accentuates the cardiac size. Aortic atherosclerosis and tortuosity. Retrocardiac density likely corresponds to hiatal hernia as seen on prior exams. No acute airspace disease, large pleural effusion or pneumothorax. No acute osseous abnormalities are seen. IMPRESSION: 1. Low lung volumes without acute abnormality. 2.  Aortic Atherosclerosis (ICD10-I70.0). Electronically Signed   By: Narda Rutherford M.D.   On: 06/23/2018 23:45   Ct Head Wo Contrast  Result Date: 06/23/2018 CLINICAL DATA:  Headache and neck pain following an unwitnessed fall. The patient has dementia. EXAM: CT HEAD WITHOUT CONTRAST CT CERVICAL SPINE WITHOUT CONTRAST TECHNIQUE: Multidetector CT imaging of the head and cervical spine was performed following the standard protocol without intravenous contrast. Multiplanar CT image reconstructions of the cervical spine were also generated. COMPARISON:  Head CT dated 08/22/2017. Cervical spine CT dated 05/15/2016. FINDINGS: CT HEAD FINDINGS Brain: Stable moderately enlarged ventricles and subarachnoid spaces. Stable moderate patchy white matter low density in both cerebral hemispheres. Small, old right basal ganglia lacunar infarct. Small, old bilateral cerebellar hemisphere infarcts. No intracranial hemorrhage, mass lesion or CT evidence of acute infarction. Vascular: No hyperdense vessel or unexpected calcification. Skull: Normal. Negative for fracture  or focal lesion. Sinuses/Orbits: Status post bilateral cataract extraction. Mild bilateral maxillary sinus mucosal thickening and moderate-sized right maxillary sinus retention cyst. Other: None. CT CERVICAL SPINE FINDINGS Alignment: Stable mild anterolisthesis at the C4-5 level and mild retrolisthesis at the C6-7 level. Stable mild anterolisthesis at the T1-2 and T2-3 levels. Stable mild dextroconvex cervicothoracic scoliosis. Skull base and vertebrae: No acute fracture. No primary bone lesion or focal pathologic process. Soft tissues and spinal canal: No prevertebral fluid or swelling. No visible canal hematoma. Disc levels: Multilevel degenerative changes, without significant change. Upper chest: Clear lung apices. Other: Small bilateral thyroid nodules. The largest measures 8 mm anteriorly on the left. Dense bilateral carotid artery calcifications. IMPRESSION: 1. No skull fracture or intracranial hemorrhage. 2. No cervical spine fracture or traumatic subluxation. 3. Stable moderate diffuse cerebral and cerebellar atrophy. 4. Stable moderate chronic small vessel white matter ischemic changes in both cerebral hemispheres. 5. Old right basal ganglia lacunar infarct. 6. Small, old bilateral cerebellar hemisphere infarcts. 7. Stable multilevel cervical spine degenerative changes. 8. Dense bilateral carotid artery atheromatous calcifications. 9. Sub-centimeter thyroid nodule(s) noted, too small to characterize, but most likely benign in the absence of known clinical risk factors for thyroid carcinoma. Electronically Signed   By: Beckie Salts M.D.   On: 06/23/2018 21:52   Ct Angio Chest Pe W And/or Wo Contrast  Result Date: 06/24/2018 CLINICAL DATA:  Chest pain, PE suspected, high prob. Fall. EXAM: CT ANGIOGRAPHY CHEST WITH CONTRAST TECHNIQUE: Multidetector CT imaging of the chest was performed using the standard protocol during bolus administration of intravenous contrast. Multiplanar CT image reconstructions and  MIPs were obtained to evaluate the vascular anatomy. CONTRAST:  ISOVUE-370 IOPAMIDOL (ISOVUE-370) INJECTION 76% COMPARISON:  Radiograph yesterday FINDINGS: Cardiovascular: There are no filling defects within the pulmonary arteries to suggest pulmonary embolus. Tortuous ectatic thoracic aorta with atherosclerosis. No evidence of dissection or acute aortic abnormality. There are coronary artery calcifications. Mild multi chamber cardiomegaly. No significant pericardial effusion. Contrast refluxes into the hepatic veins and IVC. Mediastinum/Nodes: No enlarged mediastinal or hilar lymph nodes. No mediastinal hemorrhage or hematoma. Moderate to large hiatal hernia, majority of the stomach is intrathoracic. Lungs/Pleura: Trace right pleural effusion/pleural thickening. Dependent atelectasis in both lungs. No pneumothorax. No confluent airspace disease. No evidence pulmonary edema. No pulmonary mass. Upper Abdomen: Contrast refluxes into the hepatic veins and IVC. No other acute finding. Musculoskeletal: No acute fracture of the ribs, sternum, or thoracic spine. Degenerative change of the right shoulder. Surgical anchors in the left shoulder. No confluent body wall contusion. Review of the MIP images confirms the above findings. IMPRESSION:  1. No pulmonary embolus. No evidence of acute traumatic injury related to fall. 2. Mild multi chamber cardiomegaly. Contrast refluxing into the hepatic veins and IVC suggesting elevated right heart pressures. Tortuous atherosclerotic thoracic aorta. 3. Trace right pleural effusion/pleural thickening. Dependent atelectasis in both lungs. 4. Moderate to large hiatal hernia, majority of the stomach is intrathoracic. Aortic Atherosclerosis (ICD10-I70.0). Electronically Signed   By: Narda Rutherford M.D.   On: 06/24/2018 01:55   Ct Cervical Spine Wo Contrast  Result Date: 06/23/2018 CLINICAL DATA:  Headache and neck pain following an unwitnessed fall. The patient has dementia. EXAM:  CT HEAD WITHOUT CONTRAST CT CERVICAL SPINE WITHOUT CONTRAST TECHNIQUE: Multidetector CT imaging of the head and cervical spine was performed following the standard protocol without intravenous contrast. Multiplanar CT image reconstructions of the cervical spine were also generated. COMPARISON:  Head CT dated 08/22/2017. Cervical spine CT dated 05/15/2016. FINDINGS: CT HEAD FINDINGS Brain: Stable moderately enlarged ventricles and subarachnoid spaces. Stable moderate patchy white matter low density in both cerebral hemispheres. Small, old right basal ganglia lacunar infarct. Small, old bilateral cerebellar hemisphere infarcts. No intracranial hemorrhage, mass lesion or CT evidence of acute infarction. Vascular: No hyperdense vessel or unexpected calcification. Skull: Normal. Negative for fracture or focal lesion. Sinuses/Orbits: Status post bilateral cataract extraction. Mild bilateral maxillary sinus mucosal thickening and moderate-sized right maxillary sinus retention cyst. Other: None. CT CERVICAL SPINE FINDINGS Alignment: Stable mild anterolisthesis at the C4-5 level and mild retrolisthesis at the C6-7 level. Stable mild anterolisthesis at the T1-2 and T2-3 levels. Stable mild dextroconvex cervicothoracic scoliosis. Skull base and vertebrae: No acute fracture. No primary bone lesion or focal pathologic process. Soft tissues and spinal canal: No prevertebral fluid or swelling. No visible canal hematoma. Disc levels: Multilevel degenerative changes, without significant change. Upper chest: Clear lung apices. Other: Small bilateral thyroid nodules. The largest measures 8 mm anteriorly on the left. Dense bilateral carotid artery calcifications. IMPRESSION: 1. No skull fracture or intracranial hemorrhage. 2. No cervical spine fracture or traumatic subluxation. 3. Stable moderate diffuse cerebral and cerebellar atrophy. 4. Stable moderate chronic small vessel white matter ischemic changes in both cerebral hemispheres.  5. Old right basal ganglia lacunar infarct. 6. Small, old bilateral cerebellar hemisphere infarcts. 7. Stable multilevel cervical spine degenerative changes. 8. Dense bilateral carotid artery atheromatous calcifications. 9. Sub-centimeter thyroid nodule(s) noted, too small to characterize, but most likely benign in the absence of known clinical risk factors for thyroid carcinoma. Electronically Signed   By: Beckie Salts M.D.   On: 06/23/2018 21:52   Dg Hip Unilat W Or Wo Pelvis 2-3 Views Right  Result Date: 06/23/2018 CLINICAL DATA:  Right hip pain after fall tonight. EXAM: DG HIP (WITH OR WITHOUT PELVIS) 2-3V RIGHT COMPARISON:  Radiographs and CT 08/14/2017 FINDINGS: Three screws traverse prior femoral neck fracture. The hardware is intact. Slight irregularity of the right pubic body is unchanged from prior exam and likely related to overlap of enthesopathic change in degenerative change. No evidence of rami or pelvic fracture. Pubic symphysis is congruent. Age related degenerative change of the hips. IMPRESSION: 1. No acute fracture or subluxation of the pelvis or right hip. 2. Intact right proximal femur hardware. Electronically Signed   By: Narda Rutherford M.D.   On: 06/23/2018 23:47      Arthor Gorter, Jonny Ruiz, MD 06/24/18 0200

## 2018-06-24 NOTE — ED Notes (Signed)
Pt discharged to [facility] via [facility]

## 2018-11-13 ENCOUNTER — Other Ambulatory Visit: Payer: Self-pay

## 2018-11-13 ENCOUNTER — Emergency Department (HOSPITAL_BASED_OUTPATIENT_CLINIC_OR_DEPARTMENT_OTHER)
Admission: EM | Admit: 2018-11-13 | Discharge: 2018-11-13 | Disposition: A | Discharge status: Home / Self Care | Payer: Medicare Other | Attending: Emergency Medicine | Admitting: Emergency Medicine

## 2018-11-13 ENCOUNTER — Encounter (HOSPITAL_BASED_OUTPATIENT_CLINIC_OR_DEPARTMENT_OTHER): Payer: Self-pay | Admitting: Emergency Medicine

## 2018-11-13 DIAGNOSIS — Z8673 Personal history of transient ischemic attack (TIA), and cerebral infarction without residual deficits: Secondary | ICD-10-CM | POA: Diagnosis not present

## 2018-11-13 DIAGNOSIS — Z79899 Other long term (current) drug therapy: Secondary | ICD-10-CM | POA: Insufficient documentation

## 2018-11-13 DIAGNOSIS — F0391 Unspecified dementia with behavioral disturbance: Secondary | ICD-10-CM | POA: Insufficient documentation

## 2018-11-13 DIAGNOSIS — Z7902 Long term (current) use of antithrombotics/antiplatelets: Secondary | ICD-10-CM | POA: Insufficient documentation

## 2018-11-13 DIAGNOSIS — E039 Hypothyroidism, unspecified: Secondary | ICD-10-CM | POA: Diagnosis not present

## 2018-11-13 DIAGNOSIS — I10 Essential (primary) hypertension: Secondary | ICD-10-CM | POA: Insufficient documentation

## 2018-11-13 MED ORDER — ALPRAZOLAM 0.5 MG PO TABS
0.2500 mg | ORAL_TABLET | Freq: Once | ORAL | Status: AC
  Administered 2018-11-13: 0.25 mg via ORAL
  Filled 2018-11-13: qty 1

## 2018-11-13 MED ORDER — QUETIAPINE FUMARATE 25 MG PO TABS
50.0000 mg | ORAL_TABLET | Freq: Once | ORAL | Status: AC
Start: 2018-11-13 — End: 2018-11-13
  Administered 2018-11-13: 21:00:00 50 mg via ORAL
  Filled 2018-11-13: qty 2

## 2018-11-13 NOTE — ED Provider Notes (Signed)
[facility] EMERGENCY DEPARTMENT Provider Note   CSN: [id number] Arrival date & time: 11/13/18  2028     History   Chief Complaint Chief Complaint  Patient presents with  . Dementia    HPI Tara Hicks is a 85 y.o. female hx of dementia, hypertension, previous TIA here presenting with agitation.  Patient is from [facility] for [facility].  Patient apparently refused to take her Xanax and spit it out at the staff so patient was sent here for evaluation.  There is no reported fall and patient apparently is demented at baseline.  No fevers that is reported.  EMS noted that patient is calm and not agitated. Patient unable to provide any history.      The history is provided by the patient.  Level V caveat- dementia   Past Medical History:  Diagnosis Date  . Arthritis   . Cataract   . Complication of anesthesia   . Dementia (HCC)   . GERD (gastroesophageal reflux disease)   . Hypertension   . Hypothyroidism   . Leaky heart valve    Hx: of  . PONV (postoperative nausea and vomiting)   . TIA (transient ischemic attack)    Hx: of 2014    Patient Active Problem List   Diagnosis Date Noted  . Diverticulitis of colon with perforation 04/24/2016  . Abdominal pain 04/19/2016  . Urinary tract infection 04/19/2016  . Sacral fracture, closed (HCC) 01/28/2016  . Dementia (HCC) 01/28/2016  . Hypothyroidism 01/28/2016  . Essential hypertension 01/28/2016    Past Surgical History:  Procedure Laterality Date  . BREAST SURGERY     Hx: of left breast cyst removal  . CATARACT EXTRACTION    . COLONOSCOPY     Hx: of  . DILATION AND CURETTAGE OF UTERUS    . IR GENERIC HISTORICAL  04/30/2016   IR SINUS/FIST TUBE CHK-NON GI 04/30/2016 Berdine DanceMichael Shick, MD [facility]-INTERV RAD  . LUMBAR LAMINECTOMY/DECOMPRESSION MICRODISCECTOMY Left 01/31/2013   Procedure: Left lumbar five-sacral one laminectomy for synovial cyst;  Surgeon: Reinaldo Meekerandy O Kritzer, MD;  Location: [facility]UR[facility];   Service: Neurosurgery;  Laterality: Left;     OB History   No obstetric history on file.      Home Medications    Prior to Admission medications   Medication Sig Start Date End Date Taking? Authorizing Provider  amLODipine (NORVASC) 5 MG tablet Take 1 tablet (5 mg total) by mouth daily. 05/01/16  Yes Hongalgi, Maximino GreenlandAnand D, MD  busPIRone (BUSPAR) 5 MG tablet Take 5 mg by mouth 3 (three) times daily.   Yes [provider]  citalopram (CELEXA) 10 MG tablet Take 10 mg by mouth daily.   Yes [provider]  clopidogrel (PLAVIX) 75 MG tablet Take 75 mg by mouth daily.   Yes [provider]  gabapentin (NEURONTIN) 100 MG capsule Take 100 mg by mouth 3 (three) times daily.   Yes [provider]  HYDROcodone-acetaminophen (NORCO/VICODIN) 5-325 MG tablet Take 1 tablet by mouth 2 times daily at 12 noon and 4 pm.   Yes [provider]  levothyroxine (SYNTHROID, LEVOTHROID) 50 MCG tablet Take 50 mcg by mouth daily before breakfast.    Yes [provider]  QUEtiapine (SEROQUEL) 50 MG tablet Take 50 mg by mouth 2 (two) times daily.   Yes [provider]  vitamin B-12 (CYANOCOBALAMIN) 500 MCG tablet Take 500 mcg by mouth daily.   Yes [provider]  acetaminophen (TYLENOL) 325 MG  tablet Take 2 tablets (650 mg total) by mouth every 6 (six) hours as needed for mild pain, moderate pain, fever or headache (or Fever >/= 101). 05/01/16   Hongalgi, Lenis Dickinson, MD  ALPRAZolam Duanne Moron) 0.25 MG tablet Take 0.25 mg by mouth 2 (two) times daily as needed for anxiety.    [provider]  atorvastatin (LIPITOR) 20 MG tablet Take 20 mg by mouth at bedtime.    [provider]  CHOLECALCIFEROL PO Take 1,000 mg by mouth.     [provider]  divalproex (DEPAKOTE SPRINKLE) 125 MG capsule Take 125 mg by mouth at bedtime.     [provider]  donepezil (ARICEPT) 10 MG tablet Take 10 mg by mouth at bedtime.    [provider]  Magnesium Hydroxide (MILK OF MAGNESIA PO) Take 30 mLs by mouth daily as needed (constipation).     [provider]  meclizine (ANTIVERT) 12.5 MG tablet Take 12.5 mg by mouth 2 (two) times daily as needed for dizziness.    [provider]  Melatonin 5 MG TABS Take 5 mg by mouth at bedtime.     [provider]  ondansetron (ZOFRAN) 4 MG tablet Take 4 mg by mouth every 6 (six) hours as needed for nausea or vomiting.     [provider]    Family History Family History  Problem Relation Age of Onset  . Diabetes Brother   . Hypertension Brother   . Cancer - Colon Sister     Social History Social History   Tobacco Use  . Smoking status: Never Smoker  . Smokeless tobacco: Never Used  Substance Use Topics  . Alcohol use: No  . Drug use: No     Allergies   Benzodiazepines and Phenergan [promethazine]   Review of Systems Review of Systems  Psychiatric/Behavioral: Positive for behavioral problems.  All other systems reviewed and are negative.    Physical Exam Updated Vital Signs BP (!) 128/103 (BP Location: Right Arm)   Pulse 68   Temp 97.8 F (36.6 C) (Oral)   Resp 20   Wt 53 kg   SpO2 100%   BMI 18.30 kg/m   Physical Exam Vitals signs and nursing note reviewed.  Constitutional:      Comments: Demented, confused (baseline)   HENT:     Head: Normocephalic.     Nose: Nose normal.     Mouth/Throat:     Mouth: Mucous membranes are moist.  Eyes:     Extraocular Movements: Extraocular movements intact.     Pupils: Pupils are equal, round, and reactive to light.  Neck:     Musculoskeletal: Normal range of motion.  Cardiovascular:     Rate and Rhythm: Normal rate and regular rhythm.     Pulses: Normal pulses.     Heart sounds: Normal heart sounds.  Pulmonary:     Effort: Pulmonary effort is normal.     Breath sounds: Normal breath sounds.  Abdominal:     General: Abdomen is flat.     Palpations: Abdomen is  soft.  Musculoskeletal: Normal range of motion.  Skin:    General: Skin is warm.     Capillary Refill: Capillary refill takes less than 2 seconds.  Neurological:     General: No focal deficit present.     Comments: Demented, confused. CN 2- 12 intact. Moving all extremities   Psychiatric:     Comments: Calm, NAD       ED Treatments /  Results  Labs (all labs ordered are listed, but only abnormal results are displayed) Labs Reviewed - No data to display  EKG None  Radiology No results found.  Procedures Procedures (including critical care time)  Medications Ordered in ED Medications  QUEtiapine (SEROQUEL) tablet 50 mg (50 mg Oral Given 11/13/18 2041)  ALPRAZolam Prudy Feeler(XANAX) tablet 0.25 mg (0.25 mg Oral Given 11/13/18 2057)     Initial Impression / Assessment and Plan / ED Course  I have reviewed the triage vital signs and the nursing notes.  Pertinent labs & imaging results that were available during my care of the patient were reviewed by me and considered in my medical decision making (see chart for details).       Tara Hicks is a 85 y.o. female here with refusing to take her meds.  She is on Seroquel and Xanax at night as needed.  We will try to give her some Xanax and Seroquel and reassess.  She has no neurologic deficits and is afebrile and well-appearing for age.  Will not need any labs or imaging or urinalysis currently.  9:15 PM Patient took the xanax and seroquel. Patient remained calm and is now more sleepy. Stable for dc back to facility   Final Clinical Impressions(s) / ED Diagnoses   Final diagnoses:  None    ED Discharge Orders    None       Charlynne PanderYao, Heiley Shaikh Hsienta, MD 11/13/18 2115

## 2018-11-13 NOTE — ED Triage Notes (Signed)
[facility] transport from [facility] in [place]. Pt with baseline dementia. Sent for refusing oral xanax.

## 2018-11-13 NOTE — ED Notes (Signed)
Alwyn Ren nurse from [place] states pt spit out all of her medicine and did not have xanax at [place] today.

## 2018-11-13 NOTE — ED Notes (Signed)
Report to [facility] care Nurse Alwyn Ren. Awaiting [facility] for transport. PT calm, alert, talkative. No agitation noted.

## 2018-11-13 NOTE — ED Notes (Signed)
Pt confused, speaking clear words. No agitation noted. Pt took medicine without difficulty with apple sauce.

## 2018-11-13 NOTE — Discharge Instructions (Signed)
Continue xanax as needed and seroquel at night   See your doctor  Return to ER if she has fall, agitation, lethargy.

## 2018-11-13 NOTE — ED Notes (Signed)
Called [facility] - requested transport back to [place].

## 2020-05-22 DEATH — deceased
